# Patient Record
Sex: Female | Born: 1975
Health system: Southern US, Community
[De-identification: ages and names within clinical notes are randomized; demographics above are authoritative.]

## PROBLEM LIST (undated history)

## (undated) DIAGNOSIS — J45909 Unspecified asthma, uncomplicated: Secondary | ICD-10-CM

## (undated) DIAGNOSIS — F329 Major depressive disorder, single episode, unspecified: Secondary | ICD-10-CM

## (undated) DIAGNOSIS — D649 Anemia, unspecified: Secondary | ICD-10-CM

## (undated) DIAGNOSIS — F32A Depression, unspecified: Secondary | ICD-10-CM

## (undated) DIAGNOSIS — E079 Disorder of thyroid, unspecified: Secondary | ICD-10-CM

## (undated) HISTORY — DX: Unspecified asthma, uncomplicated: J45.909

## (undated) HISTORY — DX: Disorder of thyroid, unspecified: E07.9

## (undated) HISTORY — DX: Anemia, unspecified: D64.9

## (undated) HISTORY — DX: Depression, unspecified: F32.A

## (undated) HISTORY — DX: Major depressive disorder, single episode, unspecified: F32.9

---

## 1997-09-17 ENCOUNTER — Emergency Department (HOSPITAL_COMMUNITY): Admission: EM | Admit: 1997-09-17 | Discharge: 1997-09-17 | Payer: Self-pay | Admitting: Emergency Medicine

## 1999-11-07 ENCOUNTER — Inpatient Hospital Stay (HOSPITAL_COMMUNITY): Admission: AD | Admit: 1999-11-07 | Discharge: 1999-11-07 | Payer: Self-pay | Admitting: Obstetrics & Gynecology

## 1999-11-07 ENCOUNTER — Encounter: Payer: Self-pay | Admitting: *Deleted

## 1999-12-01 ENCOUNTER — Other Ambulatory Visit: Admission: RE | Admit: 1999-12-01 | Discharge: 1999-12-01 | Payer: Self-pay | Admitting: Obstetrics & Gynecology

## 2000-04-04 ENCOUNTER — Inpatient Hospital Stay (HOSPITAL_COMMUNITY): Admission: AD | Admit: 2000-04-04 | Discharge: 2000-04-04 | Payer: Self-pay | Admitting: Obstetrics and Gynecology

## 2000-06-19 ENCOUNTER — Inpatient Hospital Stay (HOSPITAL_COMMUNITY): Admission: AD | Admit: 2000-06-19 | Discharge: 2000-06-22 | Payer: Self-pay | Admitting: Obstetrics & Gynecology

## 2000-08-03 ENCOUNTER — Other Ambulatory Visit: Admission: RE | Admit: 2000-08-03 | Discharge: 2000-08-03 | Payer: Self-pay | Admitting: Obstetrics & Gynecology

## 2003-02-10 HISTORY — PX: TUBAL LIGATION: SHX77

## 2003-09-02 ENCOUNTER — Inpatient Hospital Stay (HOSPITAL_COMMUNITY): Admission: AD | Admit: 2003-09-02 | Discharge: 2003-09-02 | Payer: Self-pay | Admitting: Obstetrics and Gynecology

## 2003-10-08 ENCOUNTER — Inpatient Hospital Stay (HOSPITAL_COMMUNITY): Admission: AD | Admit: 2003-10-08 | Discharge: 2003-10-08 | Payer: Self-pay | Admitting: Obstetrics and Gynecology

## 2003-11-22 ENCOUNTER — Encounter (INDEPENDENT_AMBULATORY_CARE_PROVIDER_SITE_OTHER): Payer: Self-pay | Admitting: Specialist

## 2003-11-22 ENCOUNTER — Inpatient Hospital Stay (HOSPITAL_COMMUNITY): Admission: AD | Admit: 2003-11-22 | Discharge: 2003-11-25 | Payer: Self-pay | Admitting: Obstetrics and Gynecology

## 2003-11-26 ENCOUNTER — Encounter: Admission: RE | Admit: 2003-11-26 | Discharge: 2003-12-26 | Payer: Self-pay | Admitting: Obstetrics and Gynecology

## 2003-12-27 ENCOUNTER — Encounter: Admission: RE | Admit: 2003-12-27 | Discharge: 2004-01-26 | Payer: Self-pay | Admitting: Obstetrics and Gynecology

## 2004-02-26 ENCOUNTER — Encounter: Admission: RE | Admit: 2004-02-26 | Discharge: 2004-03-27 | Payer: Self-pay | Admitting: Obstetrics and Gynecology

## 2004-03-21 ENCOUNTER — Emergency Department (HOSPITAL_COMMUNITY): Admission: EM | Admit: 2004-03-21 | Discharge: 2004-03-21 | Payer: Self-pay | Admitting: Emergency Medicine

## 2004-03-28 ENCOUNTER — Encounter: Admission: RE | Admit: 2004-03-28 | Discharge: 2004-04-27 | Payer: Self-pay | Admitting: Obstetrics and Gynecology

## 2004-05-26 ENCOUNTER — Encounter: Admission: RE | Admit: 2004-05-26 | Discharge: 2004-06-25 | Payer: Self-pay | Admitting: Obstetrics and Gynecology

## 2004-07-26 ENCOUNTER — Encounter: Admission: RE | Admit: 2004-07-26 | Discharge: 2004-08-25 | Payer: Self-pay | Admitting: Obstetrics and Gynecology

## 2004-09-25 ENCOUNTER — Encounter: Admission: RE | Admit: 2004-09-25 | Discharge: 2004-10-24 | Payer: Self-pay | Admitting: Obstetrics and Gynecology

## 2005-06-23 ENCOUNTER — Emergency Department (HOSPITAL_COMMUNITY): Admission: EM | Admit: 2005-06-23 | Discharge: 2005-06-23 | Payer: Self-pay | Admitting: Emergency Medicine

## 2005-06-24 ENCOUNTER — Ambulatory Visit (HOSPITAL_COMMUNITY): Admission: RE | Admit: 2005-06-24 | Discharge: 2005-06-24 | Payer: Self-pay | Admitting: Obstetrics and Gynecology

## 2006-09-28 ENCOUNTER — Other Ambulatory Visit: Admission: RE | Admit: 2006-09-28 | Discharge: 2006-09-28 | Payer: Self-pay | Admitting: Family Medicine

## 2006-10-05 ENCOUNTER — Encounter: Admission: RE | Admit: 2006-10-05 | Discharge: 2006-10-05 | Payer: Self-pay | Admitting: Family Medicine

## 2007-09-20 ENCOUNTER — Emergency Department (HOSPITAL_COMMUNITY): Admission: EM | Admit: 2007-09-20 | Discharge: 2007-09-20 | Payer: Self-pay | Admitting: Emergency Medicine

## 2007-10-09 ENCOUNTER — Emergency Department (HOSPITAL_COMMUNITY): Admission: EM | Admit: 2007-10-09 | Discharge: 2007-10-09 | Payer: Self-pay | Admitting: Emergency Medicine

## 2008-05-02 ENCOUNTER — Emergency Department (HOSPITAL_COMMUNITY): Admission: EM | Admit: 2008-05-02 | Discharge: 2008-05-02 | Payer: Self-pay | Admitting: Emergency Medicine

## 2008-11-01 ENCOUNTER — Other Ambulatory Visit: Admission: RE | Admit: 2008-11-01 | Discharge: 2008-11-01 | Payer: Self-pay | Admitting: Gynecology

## 2008-11-01 ENCOUNTER — Encounter: Payer: Self-pay | Admitting: Gynecology

## 2008-11-01 ENCOUNTER — Ambulatory Visit: Payer: Self-pay | Admitting: Gynecology

## 2008-11-07 ENCOUNTER — Ambulatory Visit: Payer: Self-pay | Admitting: Gynecology

## 2010-06-27 NOTE — Discharge Summary (Signed)
NAMESHAKENDRA, Carla Wheeler             ACCOUNT NO.:  [id number]   MEDICAL RECORD NO.:  [id number]          PATIENT TYPE:  INP   LOCATION:  9101                          FACILITY:  [facility]   PHYSICIAN:  Zenaida Niece, M.D.DATE OF BIRTH:  Sep 04, 1975   DATE OF ADMISSION:  11/22/2003  DATE OF DISCHARGE:  11/25/2003                                 DISCHARGE SUMMARY   ADMISSION DIAGNOSES:  1.  Intrauterine pregnancy at 35 weeks.  2.  Placenta previa.  3.  Preterm premature rupture of membranes.   DISCHARGE DIAGNOSES:  1.  Intrauterine pregnancy at 35 weeks.  2.  Placenta previa.  3.  Preterm premature rupture of membranes.   PROCEDURES:  On November 22, 2003 she had a low vertical cesarean section and  bilateral partial salpingectomy.   HISTORY OF PRESENT ILLNESS:  This is a 35 year old black female gravida 3  para 2-0-0-2 with an EGA of 35+ weeks by a 10-week ultrasound who presents  with the complaint of vaginal bleeding and clots and possibly a gush of  fluid.  She does have pelvic pressure, possible contractions, and good fetal  movement.  Evaluation in triage revealed bloody fluid but no active  bleeding.  I was unable to confirm rupture of membranes.  However, an  ultrasound was performed which revealed that she did still have a complete  placenta previa as well as a low AFI of 4.6 which would be consistent with  ruptured membranes.  Prenatal care has been complicated by a placenta previa  with a recent ultrasound on October 5 showing the placenta covering the  cervical os, and she was scheduled for a C-section on October 28.  Prenatal  care was otherwise uncomplicated.  Please see full history and physical on  the chart for prenatal labs.  Past history significant for two vaginal  deliveries without complications.  Physical exam significant for a benign  abdomen that was gravid, consistent with a 36-week pregnancy.  Fetal heart  tracing was normal.  Cervix was visually closed with  bloody discharge.   HOSPITAL COURSE:  The patient was admitted on October 13 and on that evening  Dr. Ambrose Mantle performed a low vertical cesarean section and tubal ligation.  This was done under spinal anesthesia with an estimated blood loss of 1000  mL.  She delivered a viable female infant with Apgars of 9 and 9 that weighed  6 pounds 8 ounces.  Postoperatively, she had no significant complications.  Predelivery hemoglobin was 10.7, postdelivery was 8.7.  On postoperative day  #3 she requested discharge and was felt to be stable enough for discharge  home.  Her incision was healing well and prior to discharge her staples were  to be removed and Steri-Strips applied.   DISCHARGE INSTRUCTIONS:  Regular diet, pelvic rest, no strenuous activity.  Follow-up is in 10-14 days for an incision check.  Medications are Percocet  #40 one to two p.o. q.4-6h. p.r.n. pain and over-the-counter ibuprofen as  needed.  She is given our discharge pamphlet.    Todd  TDM/MEDQ  D:  11/25/2003  T:  11/26/2003  Job:  [id number]
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
## 2010-06-27 NOTE — Op Note (Signed)
Carla Wheeler, Carla NO.:  [id number]   MEDICAL RECORD NO.:  [id number]          PATIENT TYPE:  INP   LOCATION:  9101                          FACILITY:  [facility]   PHYSICIAN:  Malachi Pro. Ambrose Mantle, M.D. DATE OF BIRTH:  1975-02-14   DATE OF PROCEDURE:  11/22/2003  DATE OF DISCHARGE:                                 OPERATIVE REPORT   PREOPERATIVE DIAGNOSES:  1.  Intrauterine pregnancy at 35 weeks and 6 days.  2.  Placenta previa.  3.  Probable rupture of membranes.  4.  Sterilization.   POSTOPERATIVE DIAGNOSES:  1.  Intrauterine pregnancy at 35 weeks and 6 days.  2.  Placenta previa.  3.  Probable rupture of membranes.  4.  Sterilization.   OPERATION:  Low vertical cesarean section and bilateral tubal ligation.   SURGEON:  Malachi Pro. Ambrose Mantle, M.D.   ANESTHESIA:  Spinal.   DESCRIPTION OF PROCEDURE:  The patient was brought to the operating room and  given a spinal anesthetic by Dr. Jean Rosenthal. She was placed in the left lateral  tilt position. The fetal heart tones were confirmed to be normal in the  130's and the abdomen was prepped with Betadine solution and a Foley  catheter was inserted to straight drain after prepping the urethra. The  patient was placed supine in the left lateral tilt position, the abdomen was  draped as a sterile field. After anesthesia was confirmed, a transverse  incision was made and carried in layers through the skin, subcutaneous  tissue and fascia. The fascia was then incised transversely, separated from  the rectus muscles superiorly and inferiorly. A midline was created and the  peritoneum was opened vertically. The lower uterine segment was exposed.  Even though the lower uterine segment was fairly well developed, there was  no presenting part near the pelvis so I chose to make a low vertical  incision and made the incision in the uterus and then extended the incision  superiorly and inferiorly with blunt dissection. The cord was  apparent right  there under the incision, the head was very high, I was able to grasp the  head and with fundal pressure bring the head through the incisional opening.  After the body was delivered, the cord was clamped, the infant was given to  Dr. Tillman Abide who was in attendance.  He assigned Apgar's of 9 & 9 at 1 and 5  minutes. The baby was female and weighed over 6 pounds. The placenta was  removed, I was not able to tell for sure that it was a placenta previa but  clinically it certainly acted like a placenta previa and she had had four or  five ultrasounds confirming placenta previa.  The inside of the uterus was  palpated and found to be free of any placental fragments.  The cervix was  dilated with a ring forceps and then the vertical incision was closed with a  running suture of #0 Vicryl locking the suture and then the second layer was  done initially vertically and then a transverse layer was done.  Hemostasis  was achieved by  additional figure-of-eight sutures of #0 and 3-0 Vicryl.  Both tubes and ovaries appeared normal. I identified both tubes, traced them  to their fimbriated ends, created a window in both mesosalpinx, placed two  ties of #0 plain catgut proximally and distally on the mid portion of each  tube and cut out a segment of tube intervening. There was no bleeding,  reinspection of the uterine incisions found no bleeding. The abdominal wall  was then closed after liberal irrigation of the pelvis with interrupted  sutures of #0 Vicryl on the peritoneum and rectus muscle.  Two running  sutures of #0 Vicryl on the fascia, a running 3-0 Vicryl on the subcu tissue  and staples on the skin. The patient seemed to tolerate the procedure well.  Blood loss was estimated at 1000 mL. Sponge and needle counts were correct.  The patient returned to recovery in satisfactory condition. Dr. Tillman Abide sent the  baby to the fullterm nursery.      TFH/MEDQ  D:  11/22/2003  T:  11/22/2003  Job:   [id number]

## 2011-07-15 DIAGNOSIS — F32A Depression, unspecified: Secondary | ICD-10-CM | POA: Insufficient documentation

## 2011-07-15 DIAGNOSIS — F419 Anxiety disorder, unspecified: Secondary | ICD-10-CM | POA: Insufficient documentation

## 2013-05-26 ENCOUNTER — Ambulatory Visit: Payer: Self-pay | Admitting: Podiatrist

## 2013-07-21 ENCOUNTER — Ambulatory Visit (INDEPENDENT_AMBULATORY_CARE_PROVIDER_SITE_OTHER): Payer: BC Managed Care – PPO | Admitting: Internal Medicine

## 2013-07-21 ENCOUNTER — Encounter: Payer: Self-pay | Admitting: Internal Medicine

## 2013-07-21 VITALS — BP 106/68 | HR 76 | Temp 98.4°F | Ht 60.0 in | Wt 197.0 lb

## 2013-07-21 DIAGNOSIS — S90822A Blister (nonthermal), left foot, initial encounter: Secondary | ICD-10-CM

## 2013-07-21 DIAGNOSIS — E039 Hypothyroidism, unspecified: Secondary | ICD-10-CM

## 2013-07-21 DIAGNOSIS — IMO0002 Reserved for concepts with insufficient information to code with codable children: Secondary | ICD-10-CM

## 2013-07-21 DIAGNOSIS — E669 Obesity, unspecified: Secondary | ICD-10-CM

## 2013-07-21 MED ORDER — LEVOTHYROXINE SODIUM 100 MCG PO TABS: 100.0000 ug | ORAL_TABLET | Freq: Every day | ORAL | Status: DC

## 2013-07-21 MED ORDER — PHENTERMINE HCL 37.5 MG PO CAPS: 37.5000 mg | ORAL_CAPSULE | ORAL | Status: DC

## 2013-07-21 NOTE — Assessment & Plan Note (Signed)
She has been out of her medicine x 3 weeks Will restart at current dose Plan to recheck TSH in 1 month

## 2013-07-21 NOTE — Progress Notes (Signed)
Pre visit review using our clinic review tool, if applicable. No additional management support is needed unless otherwise documented below in the visit note. 

## 2013-07-21 NOTE — Patient Instructions (Addendum)

## 2013-07-21 NOTE — Progress Notes (Signed)
HPI  Pt presents to the clinic today to establish care. She has not had a PCP in many years. She does report that she has been on phentermine in the past for weight loss. She has not had it in the last 6 months. She would like to restart it. She also reports blisters on the bottom of her left foot. She reports this started 2 months ago. The blister bust and then they burn and sting. She has never had anything like this in the past. She has been putting neosporin on it and tinactin without any relief.  Flu: never Tetanus: more than 10 years LMP: 07/10/2013 Pap smear: 2013 Mammogram: 2014 Dentist: as needed  Past Medical History  Diagnosis Date  . Thyroid disease   . Asthma   . Depression     No current outpatient prescriptions on file.   No current facility-administered medications for this visit.    Allergies  Allergen Reactions  . Augmentin [Amoxicillin-Pot Clavulanate] Other (See Comments)    Muscle cramps    Family History  Problem Relation Age of Onset  . Cancer Mother     Breast  . Arthritis Father   . Heart disease Father   . Stroke Father   . Hypertension Father   . Diabetes Father   . Congestive Heart Failure Father   . Cancer Maternal Grandmother     Breast  . Hypertension Maternal Grandmother   . Hypertension Paternal Grandmother     History   Social History  . Marital Status: Married    Spouse Name: N/A    Number of Children: N/A  . Years of Education: N/A   Occupational History  . Not on file.   Social History Main Topics  . Smoking status: Former Smoker    Types: Cigars  . Smokeless tobacco: Never Used  . Alcohol Use: Yes     Comment: occasional  . Drug Use: Not on file  . Sexual Activity: Not on file   Other Topics Concern  . Not on file   Social History Narrative  . No narrative on file    ROS:  Constitutional: Pt reports weight gain. Denies fever, malaise, fatigue, headache.  Respiratory: Denies difficulty breathing, shortness  of breath, cough or sputum production.   Cardiovascular: Denies chest pain, chest tightness, palpitations or swelling in the hands or feet.  Skin: Pt reports blisters on bottom of left foot. Denies redness, rashes, lesions or ulcercations.  Neurological: Denies dizziness, difficulty with memory, difficulty with speech or problems with balance and coordination.   No other specific complaints in a complete review of systems (except as listed in HPI above).  PE:  BP 106/68  Pulse 76  Temp(Src) 98.4 F (36.9 C) (Oral)  Ht 5' (1.524 m)  Wt 197 lb (89.359 kg)  BMI 38.47 kg/m2  SpO2 98%  LMP 07/10/2013 Wt Readings from Last 3 Encounters:  07/21/13 197 lb (89.359 kg)    General: Appears her stated age, obese but well developed, well nourished in NAD. Skin: Healing blisters noted on bottom of left foot, some cracking of the skin noted as well. Cardiovascular: Normal rate and rhythm. S1,S2 noted.  No murmur, rubs or gallops noted. No JVD or BLE edema. No carotid bruits noted. Pulmonary/Chest: Normal effort and positive vesicular breath sounds. No respiratory distress. No wheezes, rales or ronchi noted.    Assessment and Plan:  Blisters on sole of left foot:  Using tinactin without relief Will refer to podiatry

## 2013-07-21 NOTE — Assessment & Plan Note (Signed)
Counseled on diet and exercise Will restart phentermine today  RTC in 1 month for weight check/med refill along with your physical

## 2013-08-09 ENCOUNTER — Other Ambulatory Visit: Payer: Self-pay | Admitting: *Deleted

## 2013-08-09 ENCOUNTER — Encounter: Payer: Self-pay | Admitting: Podiatry

## 2013-08-09 ENCOUNTER — Ambulatory Visit (INDEPENDENT_AMBULATORY_CARE_PROVIDER_SITE_OTHER): Payer: BC Managed Care – PPO | Admitting: Podiatry

## 2013-08-09 VITALS — Ht 60.0 in | Wt 190.0 lb

## 2013-08-09 DIAGNOSIS — B353 Tinea pedis: Secondary | ICD-10-CM

## 2013-08-09 MED ORDER — TERBINAFINE HCL 250 MG PO TABS: 250.0000 mg | ORAL_TABLET | Freq: Every day | ORAL | Status: DC

## 2013-08-09 NOTE — Progress Notes (Signed)
   Subjective:    Patient ID: Carla Wheeler, female    DOB: [birth year], 38 y.o.   MRN: [id number]  HPI Comments: On the left foot i have been getting blisters on the bottom and when they bust they itch. And it has moved to the lateral side of the foot, been using different over the counter creams .     Review of Systems  HENT: Positive for sneezing.   Eyes: Positive for itching.  All other systems reviewed and are negative.      Objective:   Physical Exam: I have reviewed her past medical history medications allergies surgeries social history and review of systems. At this rash is been there for approximately 2 months only. Itches terribly in his painful.  Pulses are strongly palpable bilateral. Neurologic sensorium is intact per Semmes-Weinstein monofilament. Deep tendon reflexes are brisk and equal bilateral. Muscle strength is 5 over 5 dorsiflexors plantar flexors inverters everters all intrinsic musculature is intact. Orthopedic evaluation demonstrates all joints distal to the ankle a full range of motion without crepitation. Cutaneous evaluation demonstrates supple well hydrated cutis with exception of the plantar medial longitudinal arch of the left foot which does demonstrate tinea pedis and vesicular tinea pedis. It also demonstrates signs of this fibrotic eczema.        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  Plan: Currently we started her one month of Lamisil 250 mg 1 by mouth daily she will followup with me in one month.

## 2013-08-25 ENCOUNTER — Encounter: Payer: BC Managed Care – PPO | Admitting: Internal Medicine

## 2013-09-06 ENCOUNTER — Ambulatory Visit: Payer: BC Managed Care – PPO | Admitting: Podiatry

## 2013-09-07 ENCOUNTER — Ambulatory Visit (INDEPENDENT_AMBULATORY_CARE_PROVIDER_SITE_OTHER): Payer: BC Managed Care – PPO | Admitting: Internal Medicine

## 2013-09-07 ENCOUNTER — Other Ambulatory Visit: Payer: Self-pay | Admitting: Internal Medicine

## 2013-09-07 ENCOUNTER — Encounter: Payer: Self-pay | Admitting: Internal Medicine

## 2013-09-07 VITALS — BP 118/74 | HR 67 | Temp 98.2°F | Resp 16 | Ht 60.0 in | Wt 196.0 lb

## 2013-09-07 DIAGNOSIS — Z23 Encounter for immunization: Secondary | ICD-10-CM

## 2013-09-07 DIAGNOSIS — Z0000 Encounter for general adult medical examination without abnormal findings: Secondary | ICD-10-CM

## 2013-09-07 DIAGNOSIS — E039 Hypothyroidism, unspecified: Secondary | ICD-10-CM

## 2013-09-07 DIAGNOSIS — E669 Obesity, unspecified: Secondary | ICD-10-CM

## 2013-09-07 DIAGNOSIS — Z13220 Encounter for screening for lipoid disorders: Secondary | ICD-10-CM

## 2013-09-07 LAB — CBC
HCT: 34.1 % — ABNORMAL LOW (ref 36.0–46.0)
Hemoglobin: 11 g/dL — ABNORMAL LOW (ref 12.0–15.0)
MCHC: 32.3 g/dL (ref 30.0–36.0)
MCV: 77.5 fl — ABNORMAL LOW (ref 78.0–100.0)
Platelets: 258 10*3/uL (ref 150.0–400.0)
RBC: 4.39 Mil/uL (ref 3.87–5.11)
RDW: 17.8 % — AB (ref 11.5–15.5)
WBC: 4.3 10*3/uL (ref 4.0–10.5)

## 2013-09-07 LAB — COMPREHENSIVE METABOLIC PANEL
ALT: 15 U/L (ref 0–35)
AST: 17 U/L (ref 0–37)
Albumin: 3.6 g/dL (ref 3.5–5.2)
Alkaline Phosphatase: 45 U/L (ref 39–117)
BUN: 10 mg/dL (ref 6–23)
CALCIUM: 8.6 mg/dL (ref 8.4–10.5)
CHLORIDE: 105 meq/L (ref 96–112)
CO2: 26 meq/L (ref 19–32)
Creatinine, Ser: 0.7 mg/dL (ref 0.4–1.2)
GFR: 111.97 mL/min (ref >60.00)
Glucose, Bld: 96 mg/dL (ref 70–99)
POTASSIUM: 4.3 meq/L (ref 3.5–5.1)
SODIUM: 136 meq/L (ref 135–145)
TOTAL PROTEIN: 7.1 g/dL (ref 6.0–8.3)
Total Bilirubin: 0.2 mg/dL (ref 0.2–1.2)

## 2013-09-07 LAB — LIPID PANEL
CHOL/HDL RATIO: 4
Cholesterol: 164 mg/dL (ref 0–200)
HDL: 42.2 mg/dL (ref >39.00)
LDL CALC: 103 mg/dL — AB (ref 0–99)
NONHDL: 121.8
TRIGLYCERIDES: 96 mg/dL (ref 0.0–149.0)
VLDL: 19.2 mg/dL (ref 0.0–40.0)

## 2013-09-07 LAB — TSH: TSH: 2.64 u[IU]/mL (ref 0.35–4.50)

## 2013-09-07 LAB — HEMOGLOBIN A1C: Hgb A1c MFr Bld: 5.7 % (ref 4.6–6.5)

## 2013-09-07 LAB — T4, FREE: FREE T4: 0.9 ng/dL (ref 0.60–1.60)

## 2013-09-07 MED ORDER — PHENTERMINE HCL 37.5 MG PO CAPS: 37.5000 mg | ORAL_CAPSULE | ORAL | Status: DC

## 2013-09-07 NOTE — Assessment & Plan Note (Signed)
No weight loss on phentermine but she is only eating once daily and snacking before bed Discussed feeding your body small, frequent meals to increase metabolism Diet plan given Encouraged her to walk for at least 30 minutes 4 days a week Phentermine refilled  RTC in 1 month for weight check/med refill

## 2013-09-07 NOTE — Progress Notes (Signed)
Subjective:    Patient ID: Carla Wheeler, female    DOB: May 25, 1975, 38 y.o.   MRN: [id number]  HPI  Pt presents to the clinic today for her annual exam.  Flu: never Tetanus: > 10 years ago LMP: 08/16/13 Pap Smear: 2014- normal Dentist: biannually  Additionally, she was restarted on her synthroid 07/21/13 after being out of it for 3 weeks. She needs her TSH rechecked today.  She was also restarted on Phentermine at her last visit. She has been on this in the past. Her starting weight was 197 pounds. She has lost a total of 1 pounds. She reports she is not hungry during the day but more hungry before bed. Her diet consists of:  Breakfast: none Lunch: [employer] chicken salad Dinner: none Snacks: popcorn, oatmeal cookies, chips  Review of Systems      Past Medical History  Diagnosis Date  . Thyroid disease   . Asthma   . Depression     Current Outpatient Prescriptions  Medication Sig Dispense Refill  . ferrous sulfate 325 (65 FE) MG tablet Take 325 mg by mouth daily with breakfast.      . Fluticasone-Salmeterol (ADVAIR DISKUS) 250-50 MCG/DOSE AEPB Inhale 1 puff into the lungs 2 (two) times daily.      Marland Kitchen guaiFENesin (MUCINEX) 600 MG 12 hr tablet Take 1-2 tablets by mouth every 12 hours as needed for congestion no longer than 4 days      . levothyroxine (SYNTHROID, LEVOTHROID) 100 MCG tablet Take 1 tablet (100 mcg total) by mouth daily before breakfast.  30 tablet  0  . phentermine 37.5 MG capsule Take 1 capsule (37.5 mg total) by mouth every morning.  30 capsule  0  . terbinafine (LAMISIL) 250 MG tablet Take 1 tablet (250 mg total) by mouth daily.  30 tablet  0  . triamcinolone (NASACORT) 55 MCG/ACT AERO nasal inhaler Place into the nose.       No current facility-administered medications for this visit.    Allergies  Allergen Reactions  . Augmentin [Amoxicillin-Pot Clavulanate] Other (See Comments)    Muscle cramps    Family History  Problem Relation Age of Onset    . Cancer Mother     Breast  . Arthritis Father   . Heart disease Father   . Stroke Father   . Hypertension Father   . Diabetes Father   . Congestive Heart Failure Father   . Cancer Maternal Grandmother     Breast  . Hypertension Maternal Grandmother   . Hypertension Paternal Grandmother   . Learning disabilities Paternal Grandmother     History   Social History  . Marital Status: Married    Spouse Name: N/A    Number of Children: N/A  . Years of Education: N/A   Occupational History  . Not on file.   Social History Main Topics  . Smoking status: Former Smoker    Types: Cigars  . Smokeless tobacco: Never Used  . Alcohol Use: 1.8 oz/week    3 Glasses of wine per week     Comment: occasional  . Drug Use: No  . Sexual Activity: Yes   Other Topics Concern  . Not on file   Social History Narrative  . No narrative on file     Constitutional: Denies fever, malaise, fatigue, headache or abrupt weight changes.  HEENT: Denies eye pain, eye redness, ear pain, ringing in the ears, wax buildup, runny nose, nasal congestion, bloody nose, or sore  throat. Respiratory: Denies difficulty breathing, shortness of breath, cough or sputum production.   Cardiovascular: Denies chest pain, chest tightness, palpitations or swelling in the hands or feet.  Gastrointestinal: Denies abdominal pain, bloating, constipation, diarrhea or blood in the stool.  GU: Denies urgency, frequency, pain with urination, burning sensation, blood in urine, odor or discharge. Musculoskeletal: Pt reports low back pain. Denies decrease in range of motion, difficulty with gait, muscle pain or joint pain and swelling.  Skin: Denies redness, rashes, lesions or ulcercations.  Neurological: Denies dizziness, difficulty with memory, difficulty with speech or problems with balance and coordination.   No other specific complaints in a complete review of systems (except as listed in HPI above).  Objective:   Physical  Exam   Ht 5' (1.524 m)  Wt 196 lb (88.905 kg)  BMI 38.28 kg/m2  LMP 08/17/2013 Wt Readings from Last 3 Encounters:  09/07/13 196 lb (88.905 kg)  08/09/13 190 lb (86.183 kg)  07/21/13 197 lb (89.359 kg)    General: Appears her stated age, obese but well developed, well nourished in NAD. Skin: Warm, dry and intact. No rashes, lesions or ulcerations noted. HEENT: Head: normal shape and size; Eyes: sclera white, no icterus, conjunctiva pink, PERRLA and EOMs intact; Ears: Tm's gray and intact, normal light reflex; Nose: mucosa pink and moist, septum midline; Throat/Mouth: Teeth present, mucosa pink and moist, no exudate, lesions or ulcerations noted.  Neck: Normal range of motion. Neck supple, trachea midline. No massses, lumps or thyromegaly present.  Cardiovascular: Normal rate and rhythm. S1,S2 noted.  No murmur, rubs or gallops noted. No JVD or BLE edema. No carotid bruits noted. Pulmonary/Chest: Normal effort and positive vesicular breath sounds. No respiratory distress. No wheezes, rales or ronchi noted.  Abdomen: Soft and nontender. Normal bowel sounds, no bruits noted. No distention or masses noted. Liver, spleen and kidneys non palpable. Musculoskeletal: Normal flexion, extension and rotation of the lower spine. Strength 5/5 BLE. Pain with palpation of the lower spine. No difficulty with gait.  Neurological: Alert and oriented. Cranial nerves II-XII intact. Coordination normal. +DTRs bilaterally. Psychiatric: Mood and affect normal. Behavior is normal. Judgment and thought content normal.       Assessment & Plan:   Preventative Health Maintenance:  Tdap given today She does not take flu vaccines Screening labs today CBC, CMET, Lipid and A1C

## 2013-09-07 NOTE — Progress Notes (Signed)
Pre visit review using our clinic review tool, if applicable. No additional management support is needed unless otherwise documented below in the visit note. 

## 2013-09-07 NOTE — Addendum Note (Signed)
Addended by: Lurlean Nanny on: 09/07/2013 11:21 AM   Modules accepted: Orders

## 2013-09-07 NOTE — Patient Instructions (Addendum)

## 2013-09-07 NOTE — Assessment & Plan Note (Signed)
On synthroid Will repeat TSH and T4 today

## 2013-09-08 NOTE — Telephone Encounter (Signed)
Pt had labs done yesterday-please advise if okay to refill at this dose

## 2013-10-09 ENCOUNTER — Ambulatory Visit: Payer: BC Managed Care – PPO | Admitting: Internal Medicine

## 2013-10-09 DIAGNOSIS — Z0289 Encounter for other administrative examinations: Secondary | ICD-10-CM

## 2013-10-11 ENCOUNTER — Telehealth: Payer: Self-pay | Admitting: Internal Medicine

## 2013-10-11 NOTE — Telephone Encounter (Signed)
Ok to contact her. If she is going to continue the weight loss medication, she must come for her scheduled follow up appt

## 2013-10-11 NOTE — Telephone Encounter (Signed)
Patient did not come for their scheduled appointment 10/09/13  Please let me know if the patient needs to be contacted immediately for follow up or if no follow up is necessary.

## 2013-10-11 NOTE — Telephone Encounter (Signed)
Spoke with pt.  Pt stated she didn't have her calendar and she would call back to r/s

## 2013-11-24 ENCOUNTER — Other Ambulatory Visit: Payer: Self-pay

## 2014-03-09 ENCOUNTER — Ambulatory Visit (INDEPENDENT_AMBULATORY_CARE_PROVIDER_SITE_OTHER): Payer: BC Managed Care – PPO | Admitting: Nurse Practitioner

## 2014-03-09 ENCOUNTER — Encounter: Payer: Self-pay | Admitting: Nurse Practitioner

## 2014-03-09 VITALS — BP 120/78 | HR 68 | Temp 98.3°F | Resp 14 | Ht 61.0 in | Wt 195.4 lb

## 2014-03-09 DIAGNOSIS — H109 Unspecified conjunctivitis: Secondary | ICD-10-CM | POA: Insufficient documentation

## 2014-03-09 NOTE — Progress Notes (Signed)
   Subjective:    Patient ID: Carla Wheeler, female    DOB: July 16, 1975, 39 y.o.   MRN: [id number]  HPI Carla Wheeler is a 39 yo female with a CC of left eye itching x 1 day.  1)  Began this morning. Grainy, itchy, and watery, small amount of sleep on bottom lashes this morning. 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    Review of Systems  Constitutional: Negative for fever, chills, diaphoresis and fatigue.  HENT: Negative for congestion and rhinorrhea.   Eyes: Positive for discharge and itching. Negative for photophobia, pain, redness and visual disturbance.       Eye has watery discharge and itching  Respiratory: Negative for cough, chest tightness, shortness of breath and wheezing.   Cardiovascular: Negative for chest pain and leg swelling.  Gastrointestinal: Negative for nausea, vomiting and diarrhea.  Skin: Negative for rash.       Objective:   Physical Exam  Constitutional: She is oriented to person, place, and time. She appears well-developed and well-nourished. No distress.  BP 120/78 mmHg  Pulse 68  Temp(Src) 98.3 F (36.8 C) (Oral)  Resp 14  Ht 5\' 1"  (1.549 m)  Wt 195 lb 6.4 oz (88.633 kg)  BMI 36.94 kg/m2  SpO2 98%   HENT:  Head: Normocephalic and atraumatic.  Right Ear: External ear normal.  Left Ear: External ear normal.  Eyes: Pupils are equal, round, and reactive to light. Right eye exhibits no chemosis, no discharge, no exudate and no hordeolum. No foreign body present in the right eye. Left eye exhibits discharge. Left eye exhibits no chemosis, no exudate and no hordeolum. No foreign body present in the left eye. Right conjunctiva is not injected. Right conjunctiva has no hemorrhage. Left conjunctiva is injected. Left conjunctiva has no hemorrhage. No scleral icterus. Right eye exhibits normal extraocular motion and no nystagmus. Left eye exhibits normal extraocular motion and no nystagmus. Right pupil is  round and reactive. Left pupil is round and reactive. Pupils are equal.  Neurological: She is alert and oriented to person, place, and time. No cranial nerve deficit. She exhibits normal muscle tone. Coordination normal.  Skin: Skin is warm and dry. No rash noted. She is not diaphoretic.  Psychiatric: She has a normal mood and affect. Her behavior is normal. Judgment and thought content normal.      Assessment & Plan:

## 2014-03-09 NOTE — Patient Instructions (Addendum)
Visine-A Anti-histamine eye drops.  Viral Conjunctivitis Conjunctivitis is an irritation (inflammation) of the clear membrane that covers the white part of the eye (the conjunctiva). The irritation can also happen on the underside of the eyelids. Conjunctivitis makes the eye red or pink in color. This is what is commonly known as pink eye. Viral conjunctivitis can spread easily (contagious). CAUSES   Infection from virus on the surface of the eye.  Infection from the irritation or injury of nearby tissues such as the eyelids or cornea.  More serious inflammation or infection on the inside of the eye.  Other eye diseases.  The use of certain eye medications. SYMPTOMS  The normally white color of the eye or the underside of the eyelid is usually pink or red in color. The pink eye is usually associated with irritation, tearing and some sensitivity to light. Viral conjunctivitis is often associated with a clear, watery discharge. If a discharge is present, there may also be some blurred vision in the affected eye. DIAGNOSIS  Conjunctivitis is diagnosed by an eye exam. The eye specialist looks for changes in the surface tissues of the eye which take on changes characteristic of the specific types of conjunctivitis. A sample of any discharge may be collected on a Q-Tip (sterile swap). The sample will be sent to a lab to see whether or not the inflammation is caused by bacterial or viral infection. TREATMENT  Viral conjunctivitis will not respond to medicines that kill germs (antibiotics). Treatment is aimed at stopping a bacterial infection on top of the viral infection. The goal of treatment is to relieve symptoms (such as itching) with antihistamine drops or other eye medications.  HOME CARE INSTRUCTIONS   To ease discomfort, apply a cool, clean wash cloth to your eye for 10 to 20 minutes, 3 to 4 times a day.  Gently wipe away any drainage from the eye with a warm, wet washcloth or a cotton  ball.  Wash your hands often with soap and use paper towels to dry.  Do not share towels or washcloths. This may spread the infection.  Change or wash your pillowcase every day.  You should not use eye make-up until the infection is gone.  Stop using contacts lenses. Ask your eye professional how to sterilize or replace them before using again. This depends on the type of contact lenses used.  Do not touch the edge of the eyelid with the eye drop bottle or ointment tube when applying medications to the affected eye. This will stop you from spreading the infection to the other eye or to others. SEEK IMMEDIATE MEDICAL CARE IF:   The infection has not improved within 3 days of beginning treatment.  A watery discharge from the eye develops.  Pain in the eye increases.  The redness is spreading.  Vision becomes blurred.  An oral temperature above 102 F (38.9 C) develops, or as your caregiver suggests.  Facial pain, redness or swelling develops.  Any problems that may be related to the prescribed medicine develop. MAKE SURE YOU:   Understand these instructions.  Will watch your condition.  Will get help right away if you are not doing well or get worse. Document Released: 01/26/2005 Document Revised: 04/20/2011 Document Reviewed: 09/15/2007 [facility] Patient Information 2015 [place], [place]. This information is not intended to replace advice given to you by your health care provider. Make sure you discuss any questions you have with your health care provider.

## 2014-03-09 NOTE — Assessment & Plan Note (Addendum)
Worsening. Believed to be viral due to contagious nature and symptoms. Eye was not reddened and the conjunctiva of the left eye was injected. Anti-histamine eye drops OTC recommended for symptom relief. Asked pt to call back if matting, yellow drainage, or pain occurs. FU prn.   Gave pt handout of information on viral conjunctivitis and a note for work.

## 2014-03-09 NOTE — Progress Notes (Signed)
Pre visit review using our clinic review tool, if applicable. No additional management support is needed unless otherwise documented below in the visit note. 

## 2014-03-12 ENCOUNTER — Telehealth: Payer: Self-pay | Admitting: Internal Medicine

## 2014-03-12 NOTE — Telephone Encounter (Signed)
PLEASE NOTE: All timestamps contained within this report are represented as [place] Standard Time. CONFIDENTIALTY NOTICE: This fax transmission is intended only for the addressee. It contains information that is legally privileged, confidential or otherwise protected from use or disclosure. If you are not the intended recipient, you are strictly prohibited from reviewing, disclosing, copying using or disseminating any of this information or taking any action in reliance on or regarding this information. If you have received this fax in error, please notify us immediately by telephone so that we can arrange for its return to [place]. 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 Caller is wanting some more info on scabies, like how she can prevent them. Nurse Assessment Nurse: Arnoldo Morale, RN, Shelton Silvas Date/Time (Eastern Time): 03/12/2014 6:07:14 PM Confirm and document reason for call. If symptomatic, describe symptoms. 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 Caller is wanting some more info on scabies, like how she can prevent them. Has the patient traveled out of the country within the last 30 days? ---Not Applicable Does the patient require triage? ---No Please document clinical information provided and list any resource used. ---Scabies is prevented by avoiding direct skin-to-skin contact with an infested person or with items such as clothing or bedding used by an infested person. Scabies treatment usually is recommended for members of the same household, particularly for those who have had prolonged  skin-to-skin contact. All household members and other potentially exposed persons should be treated at the same time as the infested person to prevent possible reereexposure and reinfestation. per cdc

## 2014-04-13 ENCOUNTER — Other Ambulatory Visit: Payer: Self-pay

## 2014-04-13 MED ORDER — LEVOTHYROXINE SODIUM 100 MCG PO TABS: ORAL_TABLET | ORAL | Status: DC

## 2014-04-13 NOTE — Telephone Encounter (Signed)
Pt request refill levothyroxine 100 mcg to [facility];last CPX & TSH 09/08/13. Pt missed f/u appt but pt will call and schedule appt before med runs out.

## 2014-04-27 ENCOUNTER — Encounter: Payer: Self-pay | Admitting: Family Medicine

## 2014-04-27 ENCOUNTER — Ambulatory Visit (INDEPENDENT_AMBULATORY_CARE_PROVIDER_SITE_OTHER): Payer: BC Managed Care – PPO | Admitting: Family Medicine

## 2014-04-27 ENCOUNTER — Encounter: Payer: Self-pay | Admitting: *Deleted

## 2014-04-27 VITALS — BP 122/78 | HR 93 | Temp 100.7°F | Wt 192.5 lb

## 2014-04-27 DIAGNOSIS — R509 Fever, unspecified: Secondary | ICD-10-CM

## 2014-04-27 DIAGNOSIS — J209 Acute bronchitis, unspecified: Secondary | ICD-10-CM

## 2014-04-27 LAB — POCT INFLUENZA A/B
INFLUENZA B, POC: NEGATIVE
Influenza A, POC: NEGATIVE

## 2014-04-27 MED ORDER — AZITHROMYCIN 250 MG PO TABS: ORAL_TABLET | ORAL | Status: DC

## 2014-04-27 MED ORDER — ALBUTEROL SULFATE HFA 108 (90 BASE) MCG/ACT IN AERS: 2.0000 | INHALATION_SPRAY | Freq: Four times a day (QID) | RESPIRATORY_TRACT | Status: DC | PRN

## 2014-04-27 MED ORDER — GUAIFENESIN-CODEINE 100-10 MG/5ML PO SYRP: 5.0000 mL | ORAL_SOLUTION | Freq: Every evening | ORAL | Status: DC | PRN

## 2014-04-27 NOTE — Progress Notes (Signed)
   Subjective:    Patient ID: Carla Wheeler, female    DOB: 03/29/1975, 39 y.o.   MRN: [id number]  Cough This is a new problem. The current episode started in the past 7 days (5 days). The problem has been gradually worsening. The problem occurs constantly. The cough is productive of sputum. Associated symptoms include chest pain, chills, a fever, headaches, myalgias, nasal congestion, rhinorrhea, shortness of breath and wheezing. Pertinent negatives include no sore throat. Associated symptoms comments: 100.5-101 F in last 2 days  sensation of something in throat  Chest pain with deep breath, anterior. The symptoms are aggravated by lying down (kee(ping her up at night). Risk factors: nonsmoker. Treatments tried: mucinex robitussin DM, Alkaseltzer, advair. The treatment provided mild relief. Her past medical history is significant for asthma and environmental allergies.   Teacher for preK.  Strep exposure.   Review of Systems  Constitutional: Positive for fever and chills.  HENT: Positive for rhinorrhea. Negative for sore throat.   Respiratory: Positive for cough, shortness of breath and wheezing.   Cardiovascular: Positive for chest pain.  Musculoskeletal: Positive for myalgias.  Allergic/Immunologic: Positive for environmental allergies.  Neurological: Positive for headaches.       Objective:   Physical Exam  Constitutional: Vital signs are normal. She appears well-developed and well-nourished. She is cooperative.  Non-toxic appearance. She does not appear ill. No distress.  HENT:  Head: Normocephalic.  Right Ear: Hearing, tympanic membrane, external ear and ear canal normal. Tympanic membrane is not erythematous, not retracted and not bulging.  Left Ear: Hearing, tympanic membrane, external ear and ear canal normal. Tympanic membrane is not erythematous, not retracted and not bulging.  Nose: Mucosal edema and rhinorrhea present. Right sinus exhibits no maxillary sinus tenderness  and no frontal sinus tenderness. Left sinus exhibits no maxillary sinus tenderness and no frontal sinus tenderness.  Mouth/Throat: Uvula is midline, oropharynx is clear and moist and mucous membranes are normal. No oropharyngeal exudate, posterior oropharyngeal edema, posterior oropharyngeal erythema or tonsillar abscesses.  Eyes: Conjunctivae, EOM and lids are normal. Pupils are equal, round, and reactive to light. Lids are everted and swept, no foreign bodies found.  Neck: Trachea normal.  Cardiovascular: Normal rate, regular rhythm, S1 normal, S2 normal, normal heart sounds, intact distal pulses and normal pulses.  Exam reveals no gallop and no friction rub.   No murmur heard. Pulmonary/Chest: Effort normal and breath sounds normal. No tachypnea. No respiratory distress. She has no decreased breath sounds. She has no wheezes. She has no rhonchi. She has no rales.  Lymphadenopathy:    She has no cervical adenopathy.  Neurological: She is alert.  Skin: Skin is warm, dry and intact. No rash noted.  Psychiatric: Her speech is normal and behavior is normal. Judgment normal. Her mood appears not anxious. Cognition and memory are normal. She does not exhibit a depressed mood.          Assessment & Plan:

## 2014-04-27 NOTE — Patient Instructions (Signed)
Rest, fluids, complete antibiotics.  Mucinex DM during the day, cough suppressant prescription at night.

## 2014-04-27 NOTE — Progress Notes (Signed)
Pre visit review using our clinic review tool, if applicable. No additional management support is needed unless otherwise documented below in the visit note. 

## 2014-04-27 NOTE — Assessment & Plan Note (Addendum)
Treat with antibiotics given  New onset fever and prolonged course.  rest, fluids, continue advair given hx of asthma, will given rx for albuterol for wheeze to have for rescue.  Flu test negative.

## 2014-06-22 ENCOUNTER — Ambulatory Visit: Payer: BC Managed Care – PPO | Admitting: Internal Medicine

## 2014-06-22 ENCOUNTER — Other Ambulatory Visit: Payer: Self-pay | Admitting: Internal Medicine

## 2014-06-22 ENCOUNTER — Telehealth: Payer: Self-pay | Admitting: Internal Medicine

## 2014-06-22 DIAGNOSIS — Z0289 Encounter for other administrative examinations: Secondary | ICD-10-CM

## 2014-06-22 MED ORDER — LEVOTHYROXINE SODIUM 100 MCG PO TABS: ORAL_TABLET | ORAL | Status: DC

## 2014-06-22 NOTE — Telephone Encounter (Signed)
She already has follow up scheduled for next week

## 2014-06-22 NOTE — Telephone Encounter (Signed)
Patient did not come in for their appointment today for weigh check follow up.  Please let me know if patient needs to be contacted immediately for follow up or no follow up needed.

## 2014-06-26 ENCOUNTER — Encounter: Payer: Self-pay | Admitting: Internal Medicine

## 2014-06-26 ENCOUNTER — Ambulatory Visit (INDEPENDENT_AMBULATORY_CARE_PROVIDER_SITE_OTHER): Payer: BC Managed Care – PPO | Admitting: Internal Medicine

## 2014-06-26 VITALS — BP 128/82 | HR 85 | Temp 98.4°F | Wt 194.0 lb

## 2014-06-26 DIAGNOSIS — D509 Iron deficiency anemia, unspecified: Secondary | ICD-10-CM | POA: Diagnosis not present

## 2014-06-26 DIAGNOSIS — E039 Hypothyroidism, unspecified: Secondary | ICD-10-CM

## 2014-06-26 DIAGNOSIS — E669 Obesity, unspecified: Secondary | ICD-10-CM | POA: Diagnosis not present

## 2014-06-26 MED ORDER — PHENTERMINE HCL 15 MG PO CAPS: 15.0000 mg | ORAL_CAPSULE | ORAL | Status: DC

## 2014-06-26 NOTE — Assessment & Plan Note (Signed)
Will check TSH and T4 today Will adjust and refill Synthroid after labs are back

## 2014-06-26 NOTE — Patient Instructions (Signed)
Anemia, Nonspecific Anemia is a condition in which the concentration of red blood cells or hemoglobin in the blood is below normal. Hemoglobin is a substance in red blood cells that carries oxygen to the tissues of the body. Anemia results in not enough oxygen reaching these tissues.  CAUSES  Common causes of anemia include:   Excessive bleeding. Bleeding may be internal or external. This includes excessive bleeding from periods (in women) or from the intestine.   Poor nutrition.   Chronic kidney, thyroid, and liver disease.  Bone marrow disorders that decrease red blood cell production.  Cancer and treatments for cancer.  HIV, AIDS, and their treatments.  Spleen problems that increase red blood cell destruction.  Blood disorders.  Excess destruction of red blood cells due to infection, medicines, and autoimmune disorders. SIGNS AND SYMPTOMS   Minor weakness.   Dizziness.   Headache.  Palpitations.   Shortness of breath, especially with exercise.   Paleness.  Cold sensitivity.  Indigestion.  Nausea.  Difficulty sleeping.  Difficulty concentrating. Symptoms may occur suddenly or they may develop slowly.  DIAGNOSIS  Additional blood tests are often needed. These help your health care provider determine the best treatment. Your health care provider will check your stool for blood and look for other causes of blood loss.  TREATMENT  Treatment varies depending on the cause of the anemia. Treatment can include:   Supplements of iron, vitamin B12, or folic acid.   Hormone medicines.   A blood transfusion. This may be needed if blood loss is severe.   Hospitalization. This may be needed if there is significant continual blood loss.   Dietary changes.  Spleen removal. HOME CARE INSTRUCTIONS Keep all follow-up appointments. It often takes many weeks to correct anemia, and having your health care provider check on your condition and your response to  treatment is very important. SEEK IMMEDIATE MEDICAL CARE IF:   You develop extreme weakness, shortness of breath, or chest pain.   You become dizzy or have trouble concentrating.  You develop heavy vaginal bleeding.   You develop a rash.   You have bloody or black, tarry stools.   You faint.   You vomit up blood.   You vomit repeatedly.   You have abdominal pain.  You have a fever or persistent symptoms for more than 2-3 days.   You have a fever and your symptoms suddenly get worse.   You are dehydrated.  MAKE SURE YOU:  Understand these instructions.  Will watch your condition.  Will get help right away if you are not doing well or get worse. Document Released: 03/05/2004 Document Revised: 09/28/2012 Document Reviewed: 07/22/2012 [employer] Patient Information 2015 [employer]. This information is not intended to replace advice given to you by your health care provider. Make sure you discuss any questions you have with your health care provider.  

## 2014-06-26 NOTE — Progress Notes (Signed)
Subjective:    Patient ID: Carla Wheeler, female    DOB: 11/12/1975, 39 y.o.   MRN: [id number]  HPI  Pt presents to the clinic today with multiple complaints.  1- She needs to follow up hypothyroidism. She has been out of her Synthroid x 2 weeks, but restarted in 1 week ago. She denies weight gain, cold intolerance, constipation or dry skin.  2- She needs to follow up anemia. Her last H=H was 11/34.1. She has been taking the ferrous sulfate daily. She is not having any constipation or bleeding.   3- She also wants to get restarted on the Phentermine. She was on this for 1-2 months and was not very successful with weight loss. Her weight today is 194 lbs. BMI is 36.66. Her goal weight is 150 lbs. She is not currently exercising. She plans to join the [facility] Breakfast: She usually does not eat breakfast, but does have a cupcoffee. Lunch: Jordan or chicken sandwich. Dinner: Maceo Pro chicken, mashed potatos, green beans. Snacks: Belvita crackers.   Review of Systems      Past Medical History  Diagnosis Date  . Thyroid disease   . Asthma   . Depression     Current Outpatient Prescriptions  Medication Sig Dispense Refill  . albuterol (PROVENTIL HFA;VENTOLIN HFA) 108 (90 BASE) MCG/ACT inhaler Inhale 2 puffs into the lungs every 6 (six) hours as needed for wheezing or shortness of breath. 1 Inhaler 2  . ferrous sulfate 325 (65 FE) MG tablet Take 325 mg by mouth daily with breakfast.    . Fluticasone-Salmeterol (ADVAIR DISKUS) 250-50 MCG/DOSE AEPB Inhale 1 puff into the lungs 2 (two) times daily.    Marland Kitchen guaiFENesin (MUCINEX) 600 MG 12 hr tablet Take 1-2 tablets by mouth every 12 hours as needed for congestion no longer than 4 days    . levothyroxine (SYNTHROID, LEVOTHROID) 100 MCG tablet TAKE 1 TABLET (100 MCG TOTAL) BY MOUTH DAILY BEFORE BREAKFAST. 30 tablet 0  . phentermine 37.5 MG capsule Take 1 capsule (37.5 mg total) by mouth every morning. (Patient not taking: Reported on 06/26/2014) 30  capsule 0  . triamcinolone (NASACORT) 55 MCG/ACT AERO nasal inhaler Place into the nose.     No current facility-administered medications for this visit.    Allergies  Allergen Reactions  . Augmentin [Amoxicillin-Pot Clavulanate] Other (See Comments)    Muscle cramps    Family History  Problem Relation Age of Onset  . Cancer Mother     Breast  . Arthritis Father   . Heart disease Father   . Stroke Father   . Hypertension Father   . Diabetes Father   . Congestive Heart Failure Father   . Cancer Maternal Grandmother     Breast  . Hypertension Maternal Grandmother   . Hypertension Paternal Grandmother   . Learning disabilities Paternal Grandmother     History   Social History  . Marital Status: Married    Spouse Name: N/A  . Number of Children: N/A  . Years of Education: N/A   Occupational History  . Not on file.   Social History Main Topics  . Smoking status: Former Smoker    Types: Cigars  . Smokeless tobacco: Never Used  . Alcohol Use: 1.8 oz/week    3 Glasses of wine per week     Comment: occasional  . Drug Use: No  . Sexual Activity: Yes   Other Topics Concern  . Not on file   Social History Narrative  Constitutional: Denies fever, malaise, fatigue, headache or abrupt weight changes.  Respiratory: Denies difficulty breathing, shortness of breath, cough or sputum production.   Cardiovascular: Denies chest pain, chest tightness, palpitations or swelling in the hands or feet.  Gastrointestinal: Denies abdominal pain, bloating, constipation, diarrhea or blood in the stool.  Neurological: Denies dizziness, difficulty with memory, difficulty with speech or problems with balance and coordination.   No other specific complaints in a complete review of systems (except as listed in HPI above).  Objective:   Physical Exam   BP 128/82 mmHg  Pulse 85  Temp(Src) 98.4 F (36.9 C) (Oral)  Wt 194 lb (87.998 kg)  SpO2 98% Wt Readings from Last 3  Encounters:  06/26/14 194 lb (87.998 kg)  04/27/14 192 lb 8 oz (87.317 kg)  03/09/14 195 lb 6.4 oz (88.633 kg)    General: Appears her stated age, obese  in NAD. Neck: Neck supple, trachea midline. No masses, lumps or thyromegaly present.  Cardiovascular: Normal rate and rhythm. S1,S2 noted.  No murmur, rubs or gallops noted. No JVD or BLE edema. No carotid bruits noted. Pulmonary/Chest: Normal effort and positive vesicular breath sounds. No respiratory distress. No wheezes, rales or ronchi noted.  Abdomen: Soft and nontender. Normal bowel sounds, no bruits noted. No distention or masses noted. Liver, spleen and kidneys non palpable. Neurological: Alert and oriented.   BMET    Component Value Date/Time   NA 136 09/07/2013 0909   K 4.3 09/07/2013 0909   CL 105 09/07/2013 0909   CO2 26 09/07/2013 0909   GLUCOSE 96 09/07/2013 0909   BUN 10 09/07/2013 0909   CREATININE 0.7 09/07/2013 0909   CALCIUM 8.6 09/07/2013 0909    Lipid Panel     Component Value Date/Time   CHOL 164 09/07/2013 0909   TRIG 96.0 09/07/2013 0909   HDL 42.20 09/07/2013 0909   CHOLHDL 4 09/07/2013 0909   VLDL 19.2 09/07/2013 0909   LDLCALC 103* 09/07/2013 0909    CBC    Component Value Date/Time   WBC 4.3 09/07/2013 0909   RBC 4.39 09/07/2013 0909   HGB 11.0* 09/07/2013 0909   HCT 34.1* 09/07/2013 0909   PLT 258.0 09/07/2013 0909   MCV 77.5* 09/07/2013 0909   MCHC 32.3 09/07/2013 0909   RDW 17.8* 09/07/2013 0909    Hgb A1C Lab Results  Component Value Date   HGBA1C 5.7 09/07/2013        Assessment & Plan:

## 2014-06-26 NOTE — Assessment & Plan Note (Signed)
Will restart Phentermine today Discussed the importance of a healthy diet and exercise regimen  RTC in 1 month for weight/check/med refill

## 2014-06-26 NOTE — Assessment & Plan Note (Signed)
Will check CBC and iron panel Continue iron supplement at this time

## 2014-06-26 NOTE — Progress Notes (Signed)
Pre visit review using our clinic review tool, if applicable. No additional management support is needed unless otherwise documented below in the visit note. 

## 2014-06-27 ENCOUNTER — Other Ambulatory Visit: Payer: Self-pay | Admitting: Internal Medicine

## 2014-06-27 ENCOUNTER — Telehealth: Payer: Self-pay

## 2014-06-27 LAB — CBC
HEMATOCRIT: 32.9 % — AB (ref 36.0–46.0)
HEMOGLOBIN: 10.7 g/dL — AB (ref 12.0–15.0)
MCHC: 32.5 g/dL (ref 30.0–36.0)
MCV: 73.8 fl — ABNORMAL LOW (ref 78.0–100.0)
Platelets: 322 10*3/uL (ref 150.0–400.0)
RBC: 4.46 Mil/uL (ref 3.87–5.11)
RDW: 17.8 % — AB (ref 11.5–15.5)
WBC: 5.6 10*3/uL (ref 4.0–10.5)

## 2014-06-27 LAB — IBC PANEL
IRON: 29 ug/dL — AB (ref 42–145)
SATURATION RATIOS: 6.8 % — AB (ref 20.0–50.0)
TRANSFERRIN: 304 mg/dL (ref 212.0–360.0)

## 2014-06-27 LAB — T4, FREE: FREE T4: 0.8 ng/dL (ref 0.60–1.60)

## 2014-06-27 LAB — TSH: TSH: 4.67 u[IU]/mL — AB (ref 0.35–4.50)

## 2014-06-27 MED ORDER — LEVOTHYROXINE SODIUM 100 MCG PO TABS: ORAL_TABLET | ORAL | Status: DC

## 2014-06-27 NOTE — Telephone Encounter (Signed)
Pt left v/m requesting cb about lab results; pt notified as instructed from mychart note and pt voiced understanding.

## 2014-08-10 ENCOUNTER — Encounter: Payer: Self-pay | Admitting: Internal Medicine

## 2014-08-10 ENCOUNTER — Ambulatory Visit (INDEPENDENT_AMBULATORY_CARE_PROVIDER_SITE_OTHER)
Admission: RE | Admit: 2014-08-10 | Discharge: 2014-08-10 | Disposition: A | Discharge status: Home / Self Care | Payer: BC Managed Care – PPO | Source: Ambulatory Visit | Attending: Internal Medicine | Admitting: Internal Medicine

## 2014-08-10 ENCOUNTER — Ambulatory Visit (INDEPENDENT_AMBULATORY_CARE_PROVIDER_SITE_OTHER): Payer: BC Managed Care – PPO | Admitting: Internal Medicine

## 2014-08-10 VITALS — BP 118/84 | HR 75 | Temp 99.0°F | Wt 192.0 lb

## 2014-08-10 DIAGNOSIS — M545 Low back pain, unspecified: Secondary | ICD-10-CM

## 2014-08-10 NOTE — Progress Notes (Signed)
Subjective:    Patient ID: Carla Wheeler, female    DOB: [birth year], 39 y.o.   MRN: [id number]  HPI  Pt presents to the clinic today with c/o low back pain.  This has been going on for the last few months. Her pain has become more consistent over the last 2-3 days. She describes the pain as achy. The pain does radiate to her lower abdomen. She denies urinary or vaginal complaints. She is constipated at times. She denies numbness or tingling down the legs. Changing positions seems to make worse. Laying down seems to make it worse. She denies any injury to her back that she is aware of. She has been taking Ibuprofen and tried massage without much relief. She would like a xray.  Review of Systems  Past Medical History  Diagnosis Date  . Thyroid disease   . Asthma   . Depression     Current Outpatient Prescriptions  Medication Sig Dispense Refill  . albuterol (PROVENTIL HFA;VENTOLIN HFA) 108 (90 BASE) MCG/ACT inhaler Inhale 2 puffs into the lungs every 6 (six) hours as needed for wheezing or shortness of breath. 1 Inhaler 2  . ferrous sulfate 325 (65 FE) MG tablet Take 325 mg by mouth daily with breakfast.    . Fluticasone-Salmeterol (ADVAIR DISKUS) 250-50 MCG/DOSE AEPB Inhale 1 puff into the lungs 2 (two) times daily.    Marland Kitchen guaiFENesin (MUCINEX) 600 MG 12 hr tablet Take 1-2 tablets by mouth every 12 hours as needed for congestion no longer than 4 days    . levothyroxine (SYNTHROID, LEVOTHROID) 100 MCG tablet TAKE 1 TABLET (100 MCG TOTAL) BY MOUTH DAILY BEFORE BREAKFAST. 30 tablet 4  . phentermine 15 MG capsule Take 1 capsule (15 mg total) by mouth every morning. 30 capsule 0  . triamcinolone (NASACORT) 55 MCG/ACT AERO nasal inhaler Place into the nose.     No current facility-administered medications for this visit.    Allergies  Allergen Reactions  . Augmentin [Amoxicillin-Pot Clavulanate] Other (See Comments)    Muscle cramps    Family History  Problem Relation Age of Onset    . Cancer Mother     Breast  . Arthritis Father   . Heart disease Father   . Stroke Father   . Hypertension Father   . Diabetes Father   . Congestive Heart Failure Father   . Cancer Maternal Grandmother     Breast  . Hypertension Maternal Grandmother   . Hypertension Paternal Grandmother   . Learning disabilities Paternal Grandmother     History   Social History  . Marital Status: Married    Spouse Name: N/A  . Number of Children: N/A  . Years of Education: N/A   Occupational History  . Not on file.   Social History Main Topics  . Smoking status: Former Smoker    Types: Cigars  . Smokeless tobacco: Never Used  . Alcohol Use: 1.8 oz/week    3 Glasses of wine per week     Comment: occasional  . Drug Use: No  . Sexual Activity: Yes   Other Topics Concern  . Not on file   Social History Narrative     Constitutional: Denies fever, malaise, fatigue, headache or abrupt weight changes.  Respiratory: Denies difficulty breathing, shortness of breath, cough or sputum production.   Cardiovascular: Denies chest pain, chest tightness, palpitations or swelling in the hands or feet.  Gastrointestinal: Denies abdominal pain, bloating, constipation, diarrhea or blood in the stool.  GU: Denies urgency, frequency, pain with urination, burning sensation, blood in urine, odor or discharge. Musculoskeletal: Pt reports low back pain. Denies decrease in range of motion, difficulty with gait, muscle pain or joint swelling.  Skin: Denies redness, rashes, lesions or ulcercations.  Neurological: Denies dizziness, difficulty with memory, difficulty with speech or problems with balance and coordination.   No other specific complaints in a complete review of systems (except as listed in HPI above).     Objective:   Physical Exam   BP 118/84 mmHg  Pulse 75  Temp(Src) 99 F (37.2 C) (Oral)  Wt 192 lb (87.091 kg)  SpO2 98%  LMP 08/06/2014 Wt Readings from Last 3 Encounters:  08/10/14  192 lb (87.091 kg)  06/26/14 194 lb (87.998 kg)  04/27/14 192 lb 8 oz (87.317 kg)    General: Appears her stated age, obese in NAD. Skin: Warm, dry and intact. No rashes, lesions or ulcerations noted. Cardiovascular: Normal rate and rhythm. S1,S2 noted.  No murmur, rubs or gallops noted.  Pulmonary/Chest: Normal effort and positive vesicular breath sounds. No respiratory distress. No wheezes, rales or ronchi noted.  Abdomen: Soft and nontender. Normal bowel sounds, no bruits noted.  Musculoskeletal: Normal flexion, extension and rotation of the spine, but pain noted with all ROM. Tenderness to palpation over the lumbar spine. Strength 5/5 BLE.  No difficulty with gait.  Neurological: Alert and oriented.   BMET    Component Value Date/Time   NA 136 09/07/2013 0909   K 4.3 09/07/2013 0909   CL 105 09/07/2013 0909   CO2 26 09/07/2013 0909   GLUCOSE 96 09/07/2013 0909   BUN 10 09/07/2013 0909   CREATININE 0.7 09/07/2013 0909   CALCIUM 8.6 09/07/2013 0909    Lipid Panel     Component Value Date/Time   CHOL 164 09/07/2013 0909   TRIG 96.0 09/07/2013 0909   HDL 42.20 09/07/2013 0909   CHOLHDL 4 09/07/2013 0909   VLDL 19.2 09/07/2013 0909   LDLCALC 103* 09/07/2013 0909    CBC    Component Value Date/Time   WBC 5.6 06/26/2014 1629   RBC 4.46 06/26/2014 1629   HGB 10.7* 06/26/2014 1629   HCT 32.9* 06/26/2014 1629   PLT 322.0 06/26/2014 1629   MCV 73.8* 06/26/2014 1629   MCHC 32.5 06/26/2014 1629   RDW 17.8* 06/26/2014 1629    Hgb A1C Lab Results  Component Value Date   HGBA1C 5.7 09/07/2013        Assessment & Plan:   Midline low back pain without sciatica:  Given duration of symptoms, will check xray today Continue Ibuprofen at this time Stretching exercises given  Will follow up after xray, RTC as needed or if symptoms persist or worsen

## 2014-08-10 NOTE — Progress Notes (Signed)
Pre visit review using our clinic review tool, if applicable. No additional management support is needed unless otherwise documented below in the visit note. 

## 2014-08-10 NOTE — Patient Instructions (Signed)
Back Exercises These exercises may help you when beginning to rehabilitate your injury. Your symptoms may resolve with or without further involvement from your physician, physical therapist or athletic trainer. While completing these exercises, remember:   Restoring tissue flexibility helps normal motion to return to the joints. This allows healthier, less painful movement and activity.  An effective stretch should be held for at least 30 seconds.  A stretch should never be painful. You should only feel a gentle lengthening or release in the stretched tissue. STRETCH - Extension, Prone on Elbows   Lie on your stomach on the floor, a bed will be too soft. Place your palms about shoulder width apart and at the height of your head.  Place your elbows under your shoulders. If this is too painful, stack pillows under your chest.  Allow your body to relax so that your hips drop lower and make contact more completely with the floor.  Hold this position for __________ seconds.  Slowly return to lying flat on the floor. Repeat __________ times. Complete this exercise __________ times per day.  RANGE OF MOTION - Extension, Prone Press Ups   Lie on your stomach on the floor, a bed will be too soft. Place your palms about shoulder width apart and at the height of your head.  Keeping your back as relaxed as possible, slowly straighten your elbows while keeping your hips on the floor. You may adjust the placement of your hands to maximize your comfort. As you gain motion, your hands will come more underneath your shoulders.  Hold this position __________ seconds.  Slowly return to lying flat on the floor. Repeat __________ times. Complete this exercise __________ times per day.  RANGE OF MOTION- Quadruped, Neutral Spine   Assume a hands and knees position on a firm surface. Keep your hands under your shoulders and your knees under your hips. You may place padding under your knees for  comfort.  Drop your head and point your tail bone toward the ground below you. This will round out your low back like an angry cat. Hold this position for __________ seconds.  Slowly lift your head and release your tail bone so that your back sags into a large arch, like an old horse.  Hold this position for __________ seconds.  Repeat this until you feel limber in your low back.  Now, find your "sweet spot." This will be the most comfortable position somewhere between the two previous positions. This is your neutral spine. Once you have found this position, tense your stomach muscles to support your low back.  Hold this position for __________ seconds. Repeat __________ times. Complete this exercise __________ times per day.  STRETCH - Flexion, Single Knee to Chest   Lie on a firm bed or floor with both legs extended in front of you.  Keeping one leg in contact with the floor, bring your opposite knee to your chest. Hold your leg in place by either grabbing behind your thigh or at your knee.  Pull until you feel a gentle stretch in your low back. Hold __________ seconds.  Slowly release your grasp and repeat the exercise with the opposite side. Repeat __________ times. Complete this exercise __________ times per day.  STRETCH - Hamstrings, Standing  Stand or sit and extend your right / left leg, placing your foot on a chair or foot stool  Keeping a slight arch in your low back and your hips straight forward.  Lead with your chest and   lean forward at the waist until you feel a gentle stretch in the back of your right / left knee or thigh. (When done correctly, this exercise requires leaning only a small distance.)  Hold this position for __________ seconds. Repeat __________ times. Complete this stretch __________ times per day. STRENGTHENING - Deep Abdominals, Pelvic Tilt   Lie on a firm bed or floor. Keeping your legs in front of you, bend your knees so they are both pointed  toward the ceiling and your feet are flat on the floor.  Tense your lower abdominal muscles to press your low back into the floor. This motion will rotate your pelvis so that your tail bone is scooping upwards rather than pointing at your feet or into the floor.  With a gentle tension and even breathing, hold this position for __________ seconds. Repeat __________ times. Complete this exercise __________ times per day.  STRENGTHENING - Abdominals, Crunches   Lie on a firm bed or floor. Keeping your legs in front of you, bend your knees so they are both pointed toward the ceiling and your feet are flat on the floor. Cross your arms over your chest.  Slightly tip your chin down without bending your neck.  Tense your abdominals and slowly lift your trunk high enough to just clear your shoulder blades. Lifting higher can put excessive stress on the low back and does not further strengthen your abdominal muscles.  Control your return to the starting position. Repeat __________ times. Complete this exercise __________ times per day.  STRENGTHENING - Quadruped, Opposite UE/LE Lift   Assume a hands and knees position on a firm surface. Keep your hands under your shoulders and your knees under your hips. You may place padding under your knees for comfort.  Find your neutral spine and gently tense your abdominal muscles so that you can maintain this position. Your shoulders and hips should form a rectangle that is parallel with the floor and is not twisted.  Keeping your trunk steady, lift your right hand no higher than your shoulder and then your left leg no higher than your hip. Make sure you are not holding your breath. Hold this position __________ seconds.  Continuing to keep your abdominal muscles tense and your back steady, slowly return to your starting position. Repeat with the opposite arm and leg. Repeat __________ times. Complete this exercise __________ times per day. Document Released:  02/13/2005 Document Revised: 04/20/2011 Document Reviewed: 05/10/2008 Exit[employer] Patient Information 2015 [employer]. This information is not intended to replace advice given to you by your health care provider. Make sure you discuss any questions you have with your health care provider.  

## 2014-08-24 ENCOUNTER — Other Ambulatory Visit: Payer: Self-pay

## 2014-08-24 MED ORDER — LEVOTHYROXINE SODIUM 100 MCG PO TABS: ORAL_TABLET | ORAL | Status: DC

## 2014-09-27 ENCOUNTER — Other Ambulatory Visit: Payer: Self-pay | Admitting: Internal Medicine

## 2014-10-01 ENCOUNTER — Ambulatory Visit (INDEPENDENT_AMBULATORY_CARE_PROVIDER_SITE_OTHER): Payer: BC Managed Care – PPO | Admitting: Internal Medicine

## 2014-10-01 ENCOUNTER — Encounter: Payer: Self-pay | Admitting: Internal Medicine

## 2014-10-01 ENCOUNTER — Ambulatory Visit (INDEPENDENT_AMBULATORY_CARE_PROVIDER_SITE_OTHER)
Admission: RE | Admit: 2014-10-01 | Discharge: 2014-10-01 | Disposition: A | Discharge status: Home / Self Care | Payer: BC Managed Care – PPO | Source: Ambulatory Visit | Attending: Internal Medicine | Admitting: Internal Medicine

## 2014-10-01 VITALS — BP 120/80 | HR 72 | Temp 97.7°F | Wt 194.0 lb

## 2014-10-01 DIAGNOSIS — M79642 Pain in left hand: Secondary | ICD-10-CM

## 2014-10-01 NOTE — Patient Instructions (Signed)
Please continue the ibuprofen 600mg  three times a day with food until the pain is much better

## 2014-10-01 NOTE — Progress Notes (Signed)
Pre visit review using our clinic review tool, if applicable. No additional management support is needed unless otherwise documented below in the visit note. 

## 2014-10-01 NOTE — Assessment & Plan Note (Addendum)
Has suffered with this for a week X-ray doesn't appear to show fracture--will await radiology over read Will put in brace Ibuprofen 600mg  tid for the next 1-2 weeks
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
## 2014-10-10 ENCOUNTER — Encounter: Payer: Self-pay | Admitting: Cardiovascular Disease

## 2015-06-25 ENCOUNTER — Other Ambulatory Visit: Payer: Self-pay | Admitting: Family Medicine

## 2015-06-25 NOTE — Telephone Encounter (Signed)
Last office visit 10/01/2014 with Dr. Silvio Pate.  Last refilled 04/27/2014 for 1 inhaler with 2 refills.  Ok to refill?

## 2015-07-10 ENCOUNTER — Other Ambulatory Visit: Payer: Self-pay | Admitting: Internal Medicine

## 2015-07-19 ENCOUNTER — Encounter: Payer: Self-pay | Admitting: Internal Medicine

## 2015-07-19 ENCOUNTER — Ambulatory Visit (INDEPENDENT_AMBULATORY_CARE_PROVIDER_SITE_OTHER): Payer: BC Managed Care – PPO | Admitting: Internal Medicine

## 2015-07-19 VITALS — BP 124/74 | HR 72 | Temp 98.9°F | Ht 61.0 in | Wt 197.2 lb

## 2015-07-19 DIAGNOSIS — J4520 Mild intermittent asthma, uncomplicated: Secondary | ICD-10-CM

## 2015-07-19 DIAGNOSIS — Z0000 Encounter for general adult medical examination without abnormal findings: Secondary | ICD-10-CM

## 2015-07-19 DIAGNOSIS — R635 Abnormal weight gain: Secondary | ICD-10-CM | POA: Diagnosis not present

## 2015-07-19 DIAGNOSIS — Z0001 Encounter for general adult medical examination with abnormal findings: Secondary | ICD-10-CM

## 2015-07-19 DIAGNOSIS — J4521 Mild intermittent asthma with (acute) exacerbation: Secondary | ICD-10-CM

## 2015-07-19 DIAGNOSIS — D509 Iron deficiency anemia, unspecified: Secondary | ICD-10-CM | POA: Insufficient documentation

## 2015-07-19 DIAGNOSIS — E039 Hypothyroidism, unspecified: Secondary | ICD-10-CM | POA: Diagnosis not present

## 2015-07-19 HISTORY — DX: Mild intermittent asthma with (acute) exacerbation: J45.21

## 2015-07-19 LAB — TSH: TSH: 6.07 mIU/L — ABNORMAL HIGH

## 2015-07-19 LAB — CBC
HCT: 32.8 % — ABNORMAL LOW (ref 35.0–45.0)
Hemoglobin: 10.1 g/dL — ABNORMAL LOW (ref 11.7–15.5)
MCH: 23.2 pg — ABNORMAL LOW (ref 27.0–33.0)
MCHC: 30.8 g/dL — ABNORMAL LOW (ref 32.0–36.0)
MCV: 75.4 fL — ABNORMAL LOW (ref 80.0–100.0)
MPV: 9.6 fL (ref 7.5–12.5)
Platelets: 339 10*3/uL (ref 140–400)
RBC: 4.35 MIL/uL (ref 3.80–5.10)
RDW: 16.3 % — ABNORMAL HIGH (ref 11.0–15.0)
WBC: 6 10*3/uL (ref 3.8–10.8)

## 2015-07-19 LAB — LIPID PANEL
CHOL/HDL RATIO: 4.1 ratio (ref <=5.0)
Cholesterol: 176 mg/dL (ref 125–200)
HDL: 43 mg/dL — AB (ref >=46)
LDL CALC: 114 mg/dL (ref <130)
Triglycerides: 93 mg/dL (ref <150)
VLDL: 19 mg/dL (ref <30)

## 2015-07-19 LAB — COMPREHENSIVE METABOLIC PANEL
ALK PHOS: 41 U/L (ref 33–115)
ALT: 10 U/L (ref 6–29)
AST: 13 U/L (ref 10–30)
Albumin: 4 g/dL (ref 3.6–5.1)
BUN: 13 mg/dL (ref 7–25)
CALCIUM: 9.1 mg/dL (ref 8.6–10.2)
CHLORIDE: 104 mmol/L (ref 98–110)
CO2: 24 mmol/L (ref 20–31)
Creat: 0.79 mg/dL (ref 0.50–1.10)
GLUCOSE: 85 mg/dL (ref 65–99)
POTASSIUM: 4.4 mmol/L (ref 3.5–5.3)
Sodium: 139 mmol/L (ref 135–146)
Total Bilirubin: 0.2 mg/dL (ref 0.2–1.2)
Total Protein: 7.1 g/dL (ref 6.1–8.1)

## 2015-07-19 LAB — T4, FREE: Free T4: 1 ng/dL (ref 0.8–1.8)

## 2015-07-19 MED ORDER — PHENTERMINE HCL 37.5 MG PO CAPS: 37.5000 mg | ORAL_CAPSULE | ORAL | Status: DC

## 2015-07-19 MED ORDER — LEVOTHYROXINE SODIUM 100 MCG PO TABS: ORAL_TABLET | ORAL | Status: DC

## 2015-07-19 NOTE — Assessment & Plan Note (Signed)
Will check CBC today Continue iron supplement

## 2015-07-19 NOTE — Assessment & Plan Note (Signed)
Will check TSH and T4 today Synthroid refilled today

## 2015-07-19 NOTE — Addendum Note (Signed)
Addended by: Ellamae Sia on: 07/19/2015 03:12 PM   Modules accepted: Miquel Dunn

## 2015-07-19 NOTE — Progress Notes (Signed)
Subjective:    Patient ID: Carla Wheeler, female    DOB: 1976/01/07, 40 y.o.   MRN: [id number]  HPI  Pt presents to the clinic today for her annual exam.  Flu: never Tetanus: 09/07/2013 Pap Smear: 2010 Mammogram: 2016 Vision Screening: as needed Dentist: annually  Diet: She does eat meat. She consumes fruits veggies daily, fruits a few times per week. She does consume some fried food. She drinks mostly water and coffee. Exercise: None  Hypothyroidism: She is taking her Synthroid as prescribed. She denies depression, constipation, weight gain, cold intolerance or dry skin.  Asthma, mild intermittent: She only uses the Advair and Albuterol as needed  Iron deficiency anemia: She takes the iron supplement as prescribed. She has no concerns about constipation.  She also wants to restart the Phentermine.  Review of Systems      Past Medical History  Diagnosis Date  . Thyroid disease   . Asthma   . Depression     Current Outpatient Prescriptions  Medication Sig Dispense Refill  . ferrous sulfate 325 (65 FE) MG tablet Take 325 mg by mouth daily with breakfast.    . Fluticasone-Salmeterol (ADVAIR DISKUS) 250-50 MCG/DOSE AEPB Inhale 1 puff into the lungs 2 (two) times daily.    Marland Kitchen levothyroxine (SYNTHROID, LEVOTHROID) 100 MCG tablet TAKE 1 TABLET (100 MCG TOTAL) BY MOUTH DAILY BEFORE BREAKFAST. 30 tablet 5  . phentermine 15 MG capsule Take 1 capsule (15 mg total) by mouth every morning. 30 capsule 0  . PROAIR HFA 108 (90 Base) MCG/ACT inhaler INHALE 2 PUFFS INTO THE LUNGS EVERY 6 (SIX) HOURS AS NEEDED FOR WHEEZING OR SHORTNESS OF BREATH. 8.5 Inhaler 0   No current facility-administered medications for this visit.    Allergies  Allergen Reactions  . Augmentin [Amoxicillin-Pot Clavulanate] Other (See Comments)    Muscle cramps    Family History  Problem Relation Age of Onset  . Cancer Mother     Breast  . Arthritis Father   . Heart disease Father   . Stroke Father    . Hypertension Father   . Diabetes Father   . Congestive Heart Failure Father   . Cancer Maternal Grandmother     Breast  . Hypertension Maternal Grandmother   . Hypertension Paternal Grandmother   . Learning disabilities Paternal Grandmother     Social History   Social History  . Marital Status: Married    Spouse Name: N/A  . Number of Children: N/A  . Years of Education: N/A   Occupational History  . Not on file.   Social History Main Topics  . Smoking status: Former Smoker    Types: Cigars  . Smokeless tobacco: Never Used  . Alcohol Use: 1.8 oz/week    3 Glasses of wine per week     Comment: occasional  . Drug Use: No  . Sexual Activity: Yes   Other Topics Concern  . Not on file   Social History Narrative     Constitutional: Pt reports weight gain. Denies fever, malaise, fatigue, headache or abrupt weight changes.  HEENT: Denies eye pain, eye redness, ear pain, ringing in the ears, wax buildup, runny nose, nasal congestion, bloody nose, or sore throat. Respiratory: Denies difficulty breathing, shortness of breath, cough or sputum production.   Cardiovascular: Denies chest pain, chest tightness, palpitations or swelling in the hands or feet.  Gastrointestinal: Denies abdominal pain, bloating, constipation, diarrhea or blood in the stool.  GU: Denies urgency, frequency, pain with  urination, burning sensation, blood in urine, odor or discharge. Musculoskeletal: Pt reports low back pain. Denies decrease in range of motion, difficulty with gait, or joint pain and swelling.  Skin: Denies redness, rashes, lesions or ulcercations.  Neurological: Denies dizziness, difficulty with memory, difficulty with speech or problems with balance and coordination.  Psych: Denies anxiety, depression, SI/HI.  No other specific complaints in a complete review of systems (except as listed in HPI above).  Objective:   Physical Exam   BP 124/74 mmHg  Pulse 72  Temp(Src) 98.9 F  (37.2 C) (Oral)  Ht 5\' 1"  (1.549 m)  Wt 197 lb 4 oz (89.472 kg)  BMI 37.29 kg/m2  SpO2 98% Wt Readings from Last 3 Encounters:  07/19/15 197 lb 4 oz (89.472 kg)  10/01/14 194 lb (87.998 kg)  08/10/14 192 lb (87.091 kg)    General: Appears her stated age, obese in NAD. Skin: Warm, dry and intact.  HEENT: Head: normal shape and size; Eyes: sclera white, no icterus, conjunctiva pink, PERRLA and EOMs intact; Ears: Tm's gray and intact, normal light reflex; Throat/Mouth: Teeth present, mucosa pink and moist, no exudate, lesions or ulcerations noted.  Neck:  Neck supple, trachea midline. No masses, lumps or thyromegaly present.  Cardiovascular: Normal rate and rhythm. S1,S2 noted.  No murmur, rubs or gallops noted. No JVD or BLE edema.  Pulmonary/Chest: Normal effort and positive vesicular breath sounds. No respiratory distress. No wheezes, rales or ronchi noted.  Abdomen: Soft and nontender. Normal bowel sounds. No distention or masses noted. Liver, spleen and kidneys non palpable. Musculoskeletal: Strength 5/5 BUE/BLE. No signs of joint swelling. No difficulty with gait.  Neurological: Alert and oriented. Cranial nerves II-XII grossly intact. Coordination normal.  Psychiatric: Mood and affect normal. Behavior is normal. Judgment and thought content normal.    BMET    Component Value Date/Time   NA 136 09/07/2013 0909   K 4.3 09/07/2013 0909   CL 105 09/07/2013 0909   CO2 26 09/07/2013 0909   GLUCOSE 96 09/07/2013 0909   BUN 10 09/07/2013 0909   CREATININE 0.7 09/07/2013 0909   CALCIUM 8.6 09/07/2013 0909    Lipid Panel     Component Value Date/Time   CHOL 164 09/07/2013 0909   TRIG 96.0 09/07/2013 0909   HDL 42.20 09/07/2013 0909   CHOLHDL 4 09/07/2013 0909   VLDL 19.2 09/07/2013 0909   LDLCALC 103* 09/07/2013 0909    CBC    Component Value Date/Time   WBC 5.6 06/26/2014 1629   RBC 4.46 06/26/2014 1629   HGB 10.7* 06/26/2014 1629   HCT 32.9* 06/26/2014 1629   PLT  322.0 06/26/2014 1629   MCV 73.8* 06/26/2014 1629   MCHC 32.5 06/26/2014 1629   RDW 17.8* 06/26/2014 1629    Hgb A1C Lab Results  Component Value Date   HGBA1C 5.7 09/07/2013        Assessment & Plan:   Preventative Health Maintenance:  She declines flu shot Tetanus UTD Pap cannot be done today because she is on her menstrual, she will reschedule She will call [facility] to schedule her mammogram Encouraged her to see an eye doctor and dentist at least annually Advised her to consume a balance diet and start an exercise regimen  Abnormal Weight Gain:  Discussed the importance of healthy diet and exercise RX for Phentermine 37.5 mg daily  RTC in 1 month for weight check/med refill

## 2015-07-19 NOTE — Patient Instructions (Signed)
Health Maintenance, Female Adopting a healthy lifestyle and getting preventive care can go a long way to promote health and wellness. Talk with your health care provider about what schedule of regular examinations is right for you. This is a good chance for you to check in with your provider about disease prevention and staying healthy. In between checkups, there are plenty of things you can do on your own. Experts have done a lot of research about which lifestyle changes and preventive measures are most likely to keep you healthy. Ask your health care provider for more information. WEIGHT AND DIET  Eat a healthy diet  Be sure to include plenty of vegetables, fruits, low-fat dairy products, and lean protein.  Do not eat a lot of foods high in solid fats, added sugars, or salt.  Get regular exercise. This is one of the most important things you can do for your health.  Most adults should exercise for at least 150 minutes each week. The exercise should increase your heart rate and make you sweat (moderate-intensity exercise).  Most adults should also do strengthening exercises at least twice a week. This is in addition to the moderate-intensity exercise.  Maintain a healthy weight  Body mass index (BMI) is a measurement that can be used to identify possible weight problems. It estimates body fat based on height and weight. Your health care provider can help determine your BMI and help you achieve or maintain a healthy weight.  For females 20 years of age and older:   A BMI below 18.5 is considered underweight.  A BMI of 18.5 to 24.9 is normal.  A BMI of 25 to 29.9 is considered overweight.  A BMI of 30 and above is considered obese.  Watch levels of cholesterol and blood lipids  You should start having your blood tested for lipids and cholesterol at 42 years of age, then have this test every 5 years.  You may need to have your cholesterol levels checked more often if:  Your lipid  or cholesterol levels are high.  You are older than 42 years of age.  You are at high risk for heart disease.  CANCER SCREENING   Lung Cancer  Lung cancer screening is recommended for adults 55-80 years old who are at high risk for lung cancer because of a history of smoking.  A yearly low-dose CT scan of the lungs is recommended for people who:  Currently smoke.  Have quit within the past 15 years.  Have at least a 30-pack-year history of smoking. A pack year is smoking an average of one pack of cigarettes a day for 1 year.  Yearly screening should continue until it has been 15 years since you quit.  Yearly screening should stop if you develop a health problem that would prevent you from having lung cancer treatment.  Breast Cancer  Practice breast self-awareness. This means understanding how your breasts normally appear and feel.  It also means doing regular breast self-exams. Let your health care provider know about any changes, no matter how small.  If you are in your 20s or 30s, you should have a clinical breast exam (CBE) by a health care provider every 1-3 years as part of a regular health exam.  If you are 40 or older, have a CBE every year. Also consider having a breast X-ray (mammogram) every year.  If you have a family history of breast cancer, talk to your health care provider about genetic screening.  If you   are at high risk for breast cancer, talk to your health care provider about having an MRI and a mammogram every year.  Breast cancer gene (BRCA) assessment is recommended for women who have family members with BRCA-related cancers. BRCA-related cancers include:  Breast.  Ovarian.  Tubal.  Peritoneal cancers.  Results of the assessment will determine the need for genetic counseling and BRCA1 and BRCA2 testing. Cervical Cancer Your health care provider may recommend that you be screened regularly for cancer of the pelvic organs (ovaries, uterus, and  vagina). This screening involves a pelvic examination, including checking for microscopic changes to the surface of your cervix (Pap test). You may be encouraged to have this screening done every 3 years, beginning at age 21.  For women ages 30-65, health care providers may recommend pelvic exams and Pap testing every 3 years, or they may recommend the Pap and pelvic exam, combined with testing for human papilloma virus (HPV), every 5 years. Some types of HPV increase your risk of cervical cancer. Testing for HPV may also be done on women of any age with unclear Pap test results.  Other health care providers may not recommend any screening for nonpregnant women who are considered low risk for pelvic cancer and who do not have symptoms. Ask your health care provider if a screening pelvic exam is right for you.  If you have had past treatment for cervical cancer or a condition that could lead to cancer, you need Pap tests and screening for cancer for at least 20 years after your treatment. If Pap tests have been discontinued, your risk factors (such as having a new sexual partner) need to be reassessed to determine if screening should resume. Some women have medical problems that increase the chance of getting cervical cancer. In these cases, your health care provider may recommend more frequent screening and Pap tests. Colorectal Cancer  This type of cancer can be detected and often prevented.  Routine colorectal cancer screening usually begins at 42 years of age and continues through 42 years of age.  Your health care provider may recommend screening at an earlier age if you have risk factors for colon cancer.  Your health care provider may also recommend using home test kits to check for hidden blood in the stool.  A small camera at the end of a tube can be used to examine your colon directly (sigmoidoscopy or colonoscopy). This is done to check for the earliest forms of colorectal  cancer.  Routine screening usually begins at age 50.  Direct examination of the colon should be repeated every 5-10 years through 42 years of age. However, you may need to be screened more often if early forms of precancerous polyps or small growths are found. Skin Cancer  Check your skin from head to toe regularly.  Tell your health care provider about any new moles or changes in moles, especially if there is a change in a mole's shape or color.  Also tell your health care provider if you have a mole that is larger than the size of a pencil eraser.  Always use sunscreen. Apply sunscreen liberally and repeatedly throughout the day.  Protect yourself by wearing long sleeves, pants, a wide-brimmed hat, and sunglasses whenever you are outside. HEART DISEASE, DIABETES, AND HIGH BLOOD PRESSURE   High blood pressure causes heart disease and increases the risk of stroke. High blood pressure is more likely to develop in:  People who have blood pressure in the high end   of the normal range (130-139/85-89 mm Hg).  People who are overweight or obese.  People who are African American.  If you are 38-23 years of age, have your blood pressure checked every 3-5 years. If you are 61 years of age or older, have your blood pressure checked every year. You should have your blood pressure measured twice--once when you are at a hospital or clinic, and once when you are not at a hospital or clinic. Record the average of the two measurements. To check your blood pressure when you are not at a hospital or clinic, you can use:  An automated blood pressure machine at a pharmacy.  A home blood pressure monitor.  If you are between 45 years and 39 years old, ask your health care provider if you should take aspirin to prevent strokes.  Have regular diabetes screenings. This involves taking a blood sample to check your fasting blood sugar level.  If you are at a normal weight and have a low risk for diabetes,  have this test once every three years after 42 years of age.  If you are overweight and have a high risk for diabetes, consider being tested at a younger age or more often. PREVENTING INFECTION  Hepatitis B  If you have a higher risk for hepatitis B, you should be screened for this virus. You are considered at high risk for hepatitis B if:  You were born in a country where hepatitis B is common. Ask your health care provider which countries are considered high risk.  Your parents were born in a high-risk country, and you have not been immunized against hepatitis B (hepatitis B vaccine).  You have HIV or AIDS.  You use needles to inject street drugs.  You live with someone who has hepatitis B.  You have had sex with someone who has hepatitis B.  You get hemodialysis treatment.  You take certain medicines for conditions, including cancer, organ transplantation, and autoimmune conditions. Hepatitis C  Blood testing is recommended for:  Everyone born from [birth year] through 1967.  Anyone with known risk factors for hepatitis C. Sexually transmitted infections (STIs)  You should be screened for sexually transmitted infections (STIs) including gonorrhea and chlamydia if:  You are sexually active and are younger than 42 years of age.  You are older than 42 years of age and your health care provider tells you that you are at risk for this type of infection.  Your sexual activity has changed since you were last screened and you are at an increased risk for chlamydia or gonorrhea. Ask your health care provider if you are at risk.  If you do not have HIV, but are at risk, it may be recommended that you take a prescription medicine daily to prevent HIV infection. This is called pre-exposure prophylaxis (PrEP). You are considered at risk if:  You are sexually active and do not regularly use condoms or know the HIV status of your partner(s).  You take drugs by injection.  You are sexually  active with a partner who has HIV. Talk with your health care provider about whether you are at high risk of being infected with HIV. If you choose to begin PrEP, you should first be tested for HIV. You should then be tested every 3 months for as long as you are taking PrEP.  PREGNANCY   If you are premenopausal and you may become pregnant, ask your health care provider about preconception counseling.  If you may  become pregnant, take 400 to 800 micrograms (mcg) of folic acid every day.  If you want to prevent pregnancy, talk to your health care provider about birth control (contraception). OSTEOPOROSIS AND MENOPAUSE   Osteoporosis is a disease in which the bones lose minerals and strength with aging. This can result in serious bone fractures. Your risk for osteoporosis can be identified using a bone density scan.  If you are 61 years of age or older, or if you are at risk for osteoporosis and fractures, ask your health care provider if you should be screened.  Ask your health care provider whether you should take a calcium or vitamin D supplement to lower your risk for osteoporosis.  Menopause may have certain physical symptoms and risks.  Hormone replacement therapy may reduce some of these symptoms and risks. Talk to your health care provider about whether hormone replacement therapy is right for you.  HOME CARE INSTRUCTIONS   Schedule regular health, dental, and eye exams.  Stay current with your immunizations.   Do not use any tobacco products including cigarettes, chewing tobacco, or electronic cigarettes.  If you are pregnant, do not drink alcohol.  If you are breastfeeding, limit how much and how often you drink alcohol.  Limit alcohol intake to no more than 1 drink per day for nonpregnant women. One drink equals 12 ounces of beer, 5 ounces of wine, or 1 ounces of hard liquor.  Do not use street drugs.  Do not share needles.  Ask your health care provider for help if  you need support or information about quitting drugs.  Tell your health care provider if you often feel depressed.  Tell your health care provider if you have ever been abused or do not feel safe at home.   This information is not intended to replace advice given to you by your health care provider. Make sure you discuss any questions you have with your health care provider.   Document Released: 08/11/2010 Document Revised: 02/16/2014 Document Reviewed: 12/28/2012 Elsevier Interactive Patient Education [employer].

## 2015-07-19 NOTE — Assessment & Plan Note (Signed)
Advised her Advair is not used as needed- will d/c Continue Albuterol prn

## 2015-07-19 NOTE — Progress Notes (Signed)
Pre visit review using our clinic review tool, if applicable. No additional management support is needed unless otherwise documented below in the visit note. 

## 2015-08-19 ENCOUNTER — Ambulatory Visit (INDEPENDENT_AMBULATORY_CARE_PROVIDER_SITE_OTHER): Payer: BC Managed Care – PPO | Admitting: Internal Medicine

## 2015-08-19 ENCOUNTER — Encounter: Payer: Self-pay | Admitting: Internal Medicine

## 2015-08-19 VITALS — BP 122/76 | HR 71 | Temp 98.6°F | Wt 190.0 lb

## 2015-08-19 DIAGNOSIS — R635 Abnormal weight gain: Secondary | ICD-10-CM

## 2015-08-19 MED ORDER — PHENTERMINE HCL 37.5 MG PO CAPS: 37.5000 mg | ORAL_CAPSULE | ORAL | Status: DC

## 2015-08-19 NOTE — Patient Instructions (Signed)

## 2015-08-19 NOTE — Progress Notes (Signed)
Subjective:    Patient ID: Carla Wheeler, female    DOB: 09-17-1975, 40 y.o.   MRN: [id number]  HPI  Pt presents to the clinic today for 1 month weight check/med refill. She was started on Phentermine at her last visit. Her starting weight was 197.25, BMI of 37.29. She has been taking the medication as directed and has not noticed any side effects. She lost 7 lbs over  The last month. Her weight today is 190 lbs with a BMI of 35.92. She has been eating more grilled and baked meats instead of fried. She has tried to increase more vegetables and salads. She is drinking 6-8 glasses of water daily, and cut back on soda.  Breakfast: omelet, orange juice Lunch: sandwich and salad Dinner: grilled meat, veggies Snacks: fruit, smoothies, granola bar Exercise: none  Review of Systems      Past Medical History  Diagnosis Date  . Thyroid disease   . Asthma   . Depression     Current Outpatient Prescriptions  Medication Sig Dispense Refill  . ferrous sulfate 325 (65 FE) MG tablet Take 325 mg by mouth daily with breakfast.    . levothyroxine (SYNTHROID, LEVOTHROID) 100 MCG tablet TAKE 1 TABLET (100 MCG TOTAL) BY MOUTH DAILY BEFORE BREAKFAST. 30 tablet 11  . phentermine 37.5 MG capsule Take 1 capsule (37.5 mg total) by mouth every morning. 30 capsule 0  . PROAIR HFA 108 (90 Base) MCG/ACT inhaler INHALE 2 PUFFS INTO THE LUNGS EVERY 6 (SIX) HOURS AS NEEDED FOR WHEEZING OR SHORTNESS OF BREATH. 8.5 Inhaler 0   No current facility-administered medications for this visit.    Allergies  Allergen Reactions  . Augmentin [Amoxicillin-Pot Clavulanate] Other (See Comments)    Muscle cramps    Family History  Problem Relation Age of Onset  . Cancer Mother     Breast  . Arthritis Father   . Heart disease Father   . Stroke Father   . Hypertension Father   . Diabetes Father   . Congestive Heart Failure Father   . Cancer Maternal Grandmother     Breast  . Hypertension Maternal Grandmother    . Hypertension Paternal Grandmother   . Learning disabilities Paternal Grandmother     Social History   Social History  . Marital Status: Married    Spouse Name: N/A  . Number of Children: N/A  . Years of Education: N/A   Occupational History  . Not on file.   Social History Main Topics  . Smoking status: Former Smoker    Types: Cigars  . Smokeless tobacco: Never Used  . Alcohol Use: 1.8 oz/week    3 Glasses of wine per week     Comment: occasional  . Drug Use: No  . Sexual Activity: Yes   Other Topics Concern  . Not on file   Social History Narrative     Constitutional: Denies fever, malaise, fatigue, headache or abrupt weight changes.  Respiratory: Denies difficulty breathing, shortness of breath, cough or sputum production.   Cardiovascular: Denies chest pain, chest tightness, palpitations or swelling in the hands or feet.  Neurological: Denies dizziness, difficulty with memory, difficulty with speech or problems with balance and coordination.    No other specific complaints in a complete review of systems (except as listed in HPI above).  Objective:   Physical Exam   BP 122/76 mmHg  Pulse 71  Temp(Src) 98.6 F (37 C) (Oral)  Wt 190 lb (86.183 kg)  SpO2  98% Wt Readings from Last 3 Encounters:  08/19/15 190 lb (86.183 kg)  07/19/15 197 lb 4 oz (89.472 kg)  10/01/14 194 lb (87.998 kg)    General: Appears her stated age, obese in NAD. Cardiovascular: Normal rate and rhythm. S1,S2 noted.  No murmur, rubs or gallops noted.  Pulmonary/Chest: Normal effort and positive vesicular breath sounds. No respiratory distress. No wheezes, rales or ronchi noted.  Neurological: Alert and oriented.   BMET    Component Value Date/Time   NA 139 07/19/2015 1511   K 4.4 07/19/2015 1511   CL 104 07/19/2015 1511   CO2 24 07/19/2015 1511   GLUCOSE 85 07/19/2015 1511   BUN 13 07/19/2015 1511   CREATININE 0.79 07/19/2015 1511   CREATININE 0.7 09/07/2013 0909    CALCIUM 9.1 07/19/2015 1511    Lipid Panel     Component Value Date/Time   CHOL 176 07/19/2015 1511   TRIG 93 07/19/2015 1511   HDL 43* 07/19/2015 1511   CHOLHDL 4.1 07/19/2015 1511   VLDL 19 07/19/2015 1511   LDLCALC 114 07/19/2015 1511    CBC    Component Value Date/Time   WBC 6.0 07/19/2015 1511   RBC 4.35 07/19/2015 1511   HGB 10.1* 07/19/2015 1511   HCT 32.8* 07/19/2015 1511   PLT 339 07/19/2015 1511   MCV 75.4* 07/19/2015 1511   MCH 23.2* 07/19/2015 1511   MCHC 30.8* 07/19/2015 1511   RDW 16.3* 07/19/2015 1511    Hgb A1C Lab Results  Component Value Date   HGBA1C 5.7 09/07/2013        Assessment & Plan:   Abnormal weight gain:  Encouraged her to cut out OJ, cut out fast food smoothies, replace granola bars with protein bars Encouraged her to start exercising, walking 30 minutes at least 3 days per week RX for Phentermine provided today  RTC in 1 month for weight check/med refill Webb Silversmith, NP

## 2015-08-19 NOTE — Progress Notes (Signed)
Pre visit review using our clinic review tool, if applicable. No additional management support is needed unless otherwise documented below in the visit note. 

## 2015-08-23 LAB — HM MAMMOGRAPHY

## 2015-08-26 ENCOUNTER — Encounter: Payer: Self-pay | Admitting: Internal Medicine

## 2015-09-23 ENCOUNTER — Ambulatory Visit: Payer: BC Managed Care – PPO | Admitting: Internal Medicine

## 2015-09-23 DIAGNOSIS — Z0289 Encounter for other administrative examinations: Secondary | ICD-10-CM

## 2015-10-01 ENCOUNTER — Ambulatory Visit (INDEPENDENT_AMBULATORY_CARE_PROVIDER_SITE_OTHER): Payer: BC Managed Care – PPO | Admitting: Internal Medicine

## 2015-10-01 ENCOUNTER — Encounter: Payer: Self-pay | Admitting: Internal Medicine

## 2015-10-01 VITALS — BP 118/82 | HR 74 | Temp 98.7°F | Wt 190.0 lb

## 2015-10-01 DIAGNOSIS — Z01419 Encounter for gynecological examination (general) (routine) without abnormal findings: Secondary | ICD-10-CM

## 2015-10-01 DIAGNOSIS — R635 Abnormal weight gain: Secondary | ICD-10-CM

## 2015-10-01 DIAGNOSIS — Z124 Encounter for screening for malignant neoplasm of cervix: Secondary | ICD-10-CM | POA: Diagnosis not present

## 2015-10-01 MED ORDER — PHENTERMINE HCL 37.5 MG PO CAPS: 37.5000 mg | ORAL_CAPSULE | ORAL | 0 refills | Status: DC

## 2015-10-02 ENCOUNTER — Other Ambulatory Visit (HOSPITAL_COMMUNITY)
Admission: RE | Admit: 2015-10-02 | Discharge: 2015-10-02 | Disposition: A | Discharge status: Home / Self Care | Payer: BC Managed Care – PPO | Source: Ambulatory Visit | Attending: Internal Medicine | Admitting: Internal Medicine

## 2015-10-02 ENCOUNTER — Encounter: Payer: Self-pay | Admitting: Internal Medicine

## 2015-10-02 DIAGNOSIS — Z01419 Encounter for gynecological examination (general) (routine) without abnormal findings: Secondary | ICD-10-CM | POA: Insufficient documentation

## 2015-10-02 DIAGNOSIS — Z1151 Encounter for screening for human papillomavirus (HPV): Secondary | ICD-10-CM | POA: Insufficient documentation

## 2015-10-02 NOTE — Progress Notes (Signed)
Subjective:    Patient ID: Murray Hodgkins, female    DOB: March 25, 1975, 40 y.o.   MRN: [id number]  HPI  Pt presents to the clinic today for 1 month weight check/med refill. She was started on Phentermine 07/2015. Her starting weight was 197.25, BMI of 37.29. She has been taking the medication as directed and has not noticed any side effects. She did not lose or gain anything over the last month but she has noticed that her clothes are fitting better. Her weight today is 190 lbs with a BMI of 35.92. She has been eating more grilled and baked meats instead of fried. She has tried to increase more vegetables and salads. She is cutting out fast food. She is drinking 6-8 glasses of water daily, and cut back on soda. She has started walking as well.  She is also due for her pap smear today. Her last pap was > 3 years ago. She is still having regular periods. She denies vaginal complaints.  Review of Systems      Past Medical History:  Diagnosis Date  . Asthma   . Depression   . Thyroid disease     Current Outpatient Prescriptions  Medication Sig Dispense Refill  . ferrous sulfate 325 (65 FE) MG tablet Take 325 mg by mouth daily with breakfast.    . levothyroxine (SYNTHROID, LEVOTHROID) 100 MCG tablet TAKE 1 TABLET (100 MCG TOTAL) BY MOUTH DAILY BEFORE BREAKFAST. 30 tablet 11  . phentermine 37.5 MG capsule Take 1 capsule (37.5 mg total) by mouth every morning. 30 capsule 0  . PROAIR HFA 108 (90 Base) MCG/ACT inhaler INHALE 2 PUFFS INTO THE LUNGS EVERY 6 (SIX) HOURS AS NEEDED FOR WHEEZING OR SHORTNESS OF BREATH. 8.5 Inhaler 0   No current facility-administered medications for this visit.     Allergies  Allergen Reactions  . Augmentin [Amoxicillin-Pot Clavulanate] Other (See Comments)    Muscle cramps    Family History  Problem Relation Age of Onset  . Cancer Mother     Breast  . Arthritis Father   . Heart disease Father   . Stroke Father   . Hypertension Father   . Diabetes  Father   . Congestive Heart Failure Father   . Cancer Maternal Grandmother     Breast  . Hypertension Maternal Grandmother   . Hypertension Paternal Grandmother   . Learning disabilities Paternal Grandmother     Social History   Social History  . Marital status: Married    Spouse name: N/A  . Number of children: N/A  . Years of education: N/A   Occupational History  . Not on file.   Social History Main Topics  . Smoking status: Former Smoker    Types: Cigars  . Smokeless tobacco: Never Used  . Alcohol use 1.8 oz/week    3 Glasses of wine per week     Comment: occasional  . Drug use: No  . Sexual activity: Yes   Other Topics Concern  . Not on file   Social History Narrative  . No narrative on file     Constitutional: Denies fever, malaise, fatigue, headache or abrupt weight changes.  Respiratory: Denies difficulty breathing, shortness of breath, cough or sputum production.   Cardiovascular: Denies chest pain, chest tightness, palpitations or swelling in the hands or feet.  GU: Denies urgency, frequency, dysuria, blood in urine of vaginal discharge/odor. Neurological: Denies dizziness, difficulty with memory, difficulty with speech or problems with balance and coordination.  No other specific complaints in a complete review of systems (except as listed in HPI above).  Objective:   Physical Exam   BP 118/82   Pulse 74   Temp 98.7 F (37.1 C) (Oral)   Wt 190 lb (86.2 kg)   LMP 08/27/2015   BMI 35.90 kg/m  Wt Readings from Last 3 Encounters:  10/01/15 190 lb (86.2 kg)  08/19/15 190 lb (86.2 kg)  07/19/15 197 lb 4 oz (89.5 kg)    General: Appears her stated age, obese in NAD. Cardiovascular: Normal rate and rhythm. S1,S2 noted.  No murmur, rubs or gallops noted.  Pulmonary/Chest: Normal effort and positive vesicular breath sounds. No respiratory distress. No wheezes, rales or ronchi noted.  Abdomen: Soft, nontender. Active bowel sounds. Pelvic: Normal  female anatomy. No discharge noted in the vaginal vault. No CMT. Adnexa non palpable. Neurological: Alert and oriented.   BMET    Component Value Date/Time   NA 139 07/19/2015 1511   K 4.4 07/19/2015 1511   CL 104 07/19/2015 1511   CO2 24 07/19/2015 1511   GLUCOSE 85 07/19/2015 1511   BUN 13 07/19/2015 1511   CREATININE 0.79 07/19/2015 1511   CALCIUM 9.1 07/19/2015 1511    Lipid Panel     Component Value Date/Time   CHOL 176 07/19/2015 1511   TRIG 93 07/19/2015 1511   HDL 43 (L) 07/19/2015 1511   CHOLHDL 4.1 07/19/2015 1511   VLDL 19 07/19/2015 1511   LDLCALC 114 07/19/2015 1511    CBC    Component Value Date/Time   WBC 6.0 07/19/2015 1511   RBC 4.35 07/19/2015 1511   HGB 10.1 (L) 07/19/2015 1511   HCT 32.8 (L) 07/19/2015 1511   PLT 339 07/19/2015 1511   MCV 75.4 (L) 07/19/2015 1511   MCH 23.2 (L) 07/19/2015 1511   MCHC 30.8 (L) 07/19/2015 1511   RDW 16.3 (H) 07/19/2015 1511    Hgb A1C Lab Results  Component Value Date   HGBA1C 5.7 09/07/2013        Assessment & Plan:   Abnormal weight gain:  Encouraged her to continue to cut out fast food and drink more water Encouraged her to start exercising, walking 30 minutes at least 3 days per week RX for Phentermine provided today  RTC in 1 month for weight check/med refill  Screening for cervical cancer with [facility] exam:  Pap today She declines STD testing Will call you with the results  Webb Silversmith, NP

## 2015-10-02 NOTE — Patient Instructions (Signed)

## 2015-10-03 LAB — CYTOLOGY - PAP

## 2015-11-01 ENCOUNTER — Ambulatory Visit (INDEPENDENT_AMBULATORY_CARE_PROVIDER_SITE_OTHER): Payer: BC Managed Care – PPO | Admitting: Internal Medicine

## 2015-11-01 ENCOUNTER — Encounter: Payer: Self-pay | Admitting: Internal Medicine

## 2015-11-01 VITALS — BP 112/60 | HR 65 | Temp 98.7°F | Wt 186.0 lb

## 2015-11-01 DIAGNOSIS — R635 Abnormal weight gain: Secondary | ICD-10-CM | POA: Diagnosis not present

## 2015-11-01 MED ORDER — PHENTERMINE HCL 37.5 MG PO CAPS: 37.5000 mg | ORAL_CAPSULE | ORAL | 0 refills | Status: DC

## 2015-11-01 MED ORDER — TOPIRAMATE 25 MG PO CPSP: 25.0000 mg | ORAL_CAPSULE | Freq: Every day | ORAL | 0 refills | Status: DC

## 2015-11-01 NOTE — Progress Notes (Signed)
Pre visit review using our clinic review tool, if applicable. No additional management support is needed unless otherwise documented below in the visit note. 

## 2015-11-01 NOTE — Patient Instructions (Signed)

## 2015-11-01 NOTE — Progress Notes (Signed)
Subjective:    Patient ID: Carla Wheeler, female    DOB: Jul 29, 1975, 40 y.o.   MRN: [id number]  HPI  Pt presents to the clinic today for 1 month weight check/med refill. She was started on Phentermine 07/2015. Her starting weight was 197.25, BMI of 37.29. She has been taking the medication as directed and has not noticed any side effects. She lost 4 lbs over the last month. Her weight today is 186 lbs with a BMI of 35.14. She has been eating more grilled and baked meats instead of fried. She has tried to increase more vegetables and salads. She is cutting out fast food. She is drinking 6-8 glasses of water daily, and cut back on soda. She has started walking as well. She walks a mile 2 x week.  Review of Systems      Past Medical History:  Diagnosis Date  . Asthma   . Depression   . Thyroid disease     Current Outpatient Prescriptions  Medication Sig Dispense Refill  . ferrous sulfate 325 (65 FE) MG tablet Take 325 mg by mouth daily with breakfast.    . levothyroxine (SYNTHROID, LEVOTHROID) 100 MCG tablet TAKE 1 TABLET (100 MCG TOTAL) BY MOUTH DAILY BEFORE BREAKFAST. 30 tablet 11  . phentermine 37.5 MG capsule Take 1 capsule (37.5 mg total) by mouth every morning. 30 capsule 0  . PROAIR HFA 108 (90 Base) MCG/ACT inhaler INHALE 2 PUFFS INTO THE LUNGS EVERY 6 (SIX) HOURS AS NEEDED FOR WHEEZING OR SHORTNESS OF BREATH. 8.5 Inhaler 0   No current facility-administered medications for this visit.     Allergies  Allergen Reactions  . Augmentin [Amoxicillin-Pot Clavulanate] Other (See Comments)    Muscle cramps    Family History  Problem Relation Age of Onset  . Cancer Mother     Breast  . Arthritis Father   . Heart disease Father   . Stroke Father   . Hypertension Father   . Diabetes Father   . Congestive Heart Failure Father   . Cancer Maternal Grandmother     Breast  . Hypertension Maternal Grandmother   . Hypertension Paternal Grandmother   . Learning disabilities  Paternal Grandmother     Social History   Social History  . Marital status: Married    Spouse name: N/A  . Number of children: N/A  . Years of education: N/A   Occupational History  . Not on file.   Social History Main Topics  . Smoking status: Former Smoker    Types: Cigars  . Smokeless tobacco: Never Used  . Alcohol use 1.8 oz/week    3 Glasses of wine per week     Comment: occasional  . Drug use: No  . Sexual activity: Yes   Other Topics Concern  . Not on file   Social History Narrative  . No narrative on file     Constitutional: Denies fever, malaise, fatigue, headache or abrupt weight changes.  Respiratory: Denies difficulty breathing, shortness of breath, cough or sputum production.   Cardiovascular: Denies chest pain, chest tightness, palpitations or swelling in the hands or feet.  GU: Denies urgency, frequency, dysuria, blood in urine of vaginal discharge/odor. Neurological: Denies dizziness, difficulty with memory, difficulty with speech or problems with balance and coordination.    No other specific complaints in a complete review of systems (except as listed in HPI above).  Objective:   Physical Exam   BP 112/60 (BP Location: Left Arm, Patient Position:  Sitting)   Pulse 65   Temp 98.7 F (37.1 C) (Oral)   Wt 186 lb (84.4 kg)   LMP 10/04/2015 (Approximate)   SpO2 99%   BMI 35.14 kg/m   Wt Readings from Last 3 Encounters:  10/01/15 190 lb (86.2 kg)  08/19/15 190 lb (86.2 kg)  07/19/15 197 lb 4 oz (89.5 kg)    General: Appears her stated age, obese in NAD. Cardiovascular: Normal rate and rhythm. S1,S2 noted.  No murmur, rubs or gallops noted.  Pulmonary/Chest: Normal effort and positive vesicular breath sounds. No respiratory distress. No wheezes, rales or ronchi noted.  Abdomen: Soft, nontender. Active bowel sounds. Pelvic: Normal female anatomy. No discharge noted in the vaginal vault. No CMT. Adnexa non palpable. Neurological: Alert and  oriented.   BMET    Component Value Date/Time   NA 139 07/19/2015 1511   K 4.4 07/19/2015 1511   CL 104 07/19/2015 1511   CO2 24 07/19/2015 1511   GLUCOSE 85 07/19/2015 1511   BUN 13 07/19/2015 1511   CREATININE 0.79 07/19/2015 1511   CALCIUM 9.1 07/19/2015 1511    Lipid Panel     Component Value Date/Time   CHOL 176 07/19/2015 1511   TRIG 93 07/19/2015 1511   HDL 43 (L) 07/19/2015 1511   CHOLHDL 4.1 07/19/2015 1511   VLDL 19 07/19/2015 1511   LDLCALC 114 07/19/2015 1511    CBC    Component Value Date/Time   WBC 6.0 07/19/2015 1511   RBC 4.35 07/19/2015 1511   HGB 10.1 (L) 07/19/2015 1511   HCT 32.8 (L) 07/19/2015 1511   PLT 339 07/19/2015 1511   MCV 75.4 (L) 07/19/2015 1511   MCH 23.2 (L) 07/19/2015 1511   MCHC 30.8 (L) 07/19/2015 1511   RDW 16.3 (H) 07/19/2015 1511    Hgb A1C Lab Results  Component Value Date   HGBA1C 5.7 09/07/2013        Assessment & Plan:   Abnormal weight gain:  Encouraged her to increase her heart rate while exercising to burn more fat eRx for Topamax 25 mg daily RX for Phentermine provided today  RTC in 1 month for weight check/med refill   Webb Silversmith, NP

## 2015-11-27 ENCOUNTER — Other Ambulatory Visit: Payer: Self-pay | Admitting: Internal Medicine

## 2015-12-02 ENCOUNTER — Ambulatory Visit: Payer: BC Managed Care – PPO | Admitting: Internal Medicine

## 2015-12-02 DIAGNOSIS — Z0289 Encounter for other administrative examinations: Secondary | ICD-10-CM

## 2016-02-19 ENCOUNTER — Other Ambulatory Visit: Payer: Self-pay | Admitting: Internal Medicine

## 2016-02-19 MED ORDER — LEVOTHYROXINE SODIUM 125 MCG PO TABS: 125.0000 ug | ORAL_TABLET | Freq: Every day | ORAL | 1 refills | Status: DC

## 2016-05-25 ENCOUNTER — Ambulatory Visit (INDEPENDENT_AMBULATORY_CARE_PROVIDER_SITE_OTHER): Payer: BC Managed Care – PPO | Admitting: Internal Medicine

## 2016-05-25 ENCOUNTER — Encounter: Payer: Self-pay | Admitting: Internal Medicine

## 2016-05-25 VITALS — BP 126/82 | HR 93 | Temp 99.8°F | Wt 195.5 lb

## 2016-05-25 DIAGNOSIS — J209 Acute bronchitis, unspecified: Secondary | ICD-10-CM

## 2016-05-25 MED ORDER — ALBUTEROL SULFATE HFA 108 (90 BASE) MCG/ACT IN AERS: INHALATION_SPRAY | RESPIRATORY_TRACT | 1 refills | Status: DC

## 2016-05-25 NOTE — Progress Notes (Signed)
   Subjective:    Patient ID: Carla Wheeler, female    DOB: Dec 01, 1975, 41 y.o.   MRN: [id number]  HPI Here due to respiratory illness  Day 3 of cough with lots of mucus--feels like it is from chest Especially bad with breathing when lying down  Horrible headache Some sore throat Pressure along side of head--but no set ear pain. Relates to cough Fever---max 100.8 Some rhinorrhea Has knot in front of left ear also  Out of the proair Never used prednisone  Alka seltzer cold plus, ibuprofen---some help  Current Outpatient Prescriptions on File Prior to Visit  Medication Sig Dispense Refill  . levothyroxine (SYNTHROID, LEVOTHROID) 125 MCG tablet Take 1 tablet (125 mcg total) by mouth daily. 30 tablet 1  . PROAIR HFA 108 (90 Base) MCG/ACT inhaler INHALE 2 PUFFS INTO THE LUNGS EVERY 6 (SIX) HOURS AS NEEDED FOR WHEEZING OR SHORTNESS OF BREATH. 8.5 Inhaler 0  . ferrous sulfate 325 (65 FE) MG tablet Take 325 mg by mouth daily with breakfast.     No current facility-administered medications on file prior to visit.     Allergies  Allergen Reactions  . Augmentin [Amoxicillin-Pot Clavulanate] Other (See Comments)    Muscle cramps    Past Medical History:  Diagnosis Date  . Asthma   . Depression   . Thyroid disease     Past Surgical History:  Procedure Laterality Date  . CESAREAN SECTION      Family History  Problem Relation Age of Onset  . Cancer Mother     Breast  . Arthritis Father   . Heart disease Father   . Stroke Father   . Hypertension Father   . Diabetes Father   . Congestive Heart Failure Father   . Cancer Maternal Grandmother     Breast  . Hypertension Maternal Grandmother   . Hypertension Paternal Grandmother   . Learning disabilities Paternal Grandmother     Social History   Social History  . Marital status: Married    Spouse name: N/A  . Number of children: N/A  . Years of education: N/A   Occupational History  . Not on file.   Social  History Main Topics  . Smoking status: Former Smoker    Types: Cigars  . Smokeless tobacco: Never Used  . Alcohol use 1.8 oz/week    3 Glasses of wine per week     Comment: occasional  . Drug use: No  . Sexual activity: Yes   Other Topics Concern  . Not on file   Social History Narrative  . No narrative on file   Review of Systems  No rash No vomiting or diarrhea Appetite is okay     Objective:   Physical Exam  Constitutional: She appears well-nourished. No distress.  HENT:  No sinus tenderness TMs normal Mild nasal inflammation Slight pharyngeal injection  Neck: No thyromegaly present.  ??small left preauricular node  Pulmonary/Chest: Effort normal and breath sounds normal. No respiratory distress. She has no wheezes. She has no rales.  Lymphadenopathy:    She has no cervical adenopathy.          Assessment & Plan:

## 2016-05-25 NOTE — Patient Instructions (Signed)
Please send a message later this week if you are worsening.

## 2016-05-25 NOTE — Progress Notes (Signed)
Pre visit review using our clinic review tool, if applicable. No additional management support is needed unless otherwise documented below in the visit note. 

## 2016-05-25 NOTE — Assessment & Plan Note (Signed)
Discussed usual viral etiology Supportive care--especially analgesics Refill albuterol If worsens later this week, will give z-pak

## 2016-05-31 ENCOUNTER — Encounter: Payer: Self-pay | Admitting: Internal Medicine

## 2016-06-01 MED ORDER — AZITHROMYCIN 250 MG PO TABS: ORAL_TABLET | ORAL | 0 refills | Status: DC

## 2016-06-06 ENCOUNTER — Other Ambulatory Visit: Payer: Self-pay | Admitting: Internal Medicine

## 2016-06-12 ENCOUNTER — Other Ambulatory Visit: Payer: Self-pay | Admitting: Family Medicine

## 2016-06-12 ENCOUNTER — Ambulatory Visit
Admission: RE | Admit: 2016-06-12 | Discharge: 2016-06-12 | Disposition: A | Discharge status: Home / Self Care | Payer: Self-pay | Source: Ambulatory Visit | Attending: Family Medicine | Admitting: Family Medicine

## 2016-06-12 DIAGNOSIS — T1490XA Injury, unspecified, initial encounter: Secondary | ICD-10-CM

## 2016-07-23 ENCOUNTER — Telehealth: Payer: Worker's Compensation | Admitting: Family

## 2016-07-23 DIAGNOSIS — B9689 Other specified bacterial agents as the cause of diseases classified elsewhere: Secondary | ICD-10-CM

## 2016-07-23 DIAGNOSIS — N760 Acute vaginitis: Secondary | ICD-10-CM

## 2016-07-23 MED ORDER — METRONIDAZOLE 500 MG PO TABS: 500.0000 mg | ORAL_TABLET | Freq: Two times a day (BID) | ORAL | 0 refills | Status: DC

## 2016-07-23 NOTE — Progress Notes (Signed)
Thank you for the details you put in the comment boxes. Those details really help [place] take better care of you.   We are sorry that you are not feeling well. Here is how we plan to help! Based on what you shared with me it looks like you: May have a vaginosis due to bacteria  Vaginosis is an inflammation of the vagina that can result in discharge, itching and pain. The cause is usually a change in the normal balance of vaginal bacteria or an infection. Vaginosis can also result from reduced estrogen levels after menopause.  The most common causes of vaginosis are:   Bacterial vaginosis which results from an overgrowth of one on several organisms that are normally present in your vagina.   Yeast infections which are caused by a naturally occurring fungus called candida.   Vaginal atrophy (atrophic vaginosis) which results from the thinning of the vagina from reduced estrogen levels after menopause.   Trichomoniasis which is caused by a parasite and is commonly transmitted by sexual intercourse.  Factors that increase your risk of developing vaginosis include: Marland Kitchen Medications, such as antibiotics and steroids . Uncontrolled diabetes . Use of hygiene products such as bubble bath, vaginal spray or vaginal deodorant . Douching . Wearing damp or tight-fitting clothing . Using an intrauterine device (IUD) for birth control . Hormonal changes, such as those associated with pregnancy, birth control pills or menopause . Sexual activity . Having a sexually transmitted infection  Your treatment plan is Metronidazole or Flagyl 500mg  twice a day for 7 days.  I have electronically sent this prescription into the pharmacy that you have chosen.  Be sure to take all of the medication as directed. Stop taking any medication if you develop a rash, tongue swelling or shortness of breath. Mothers who are breast feeding should consider pumping and discarding their breast milk while on these antibiotics. However,  there is no consensus that infant exposure at these doses would be harmful.  Remember that medication creams can weaken latex condoms. Marland Kitchen   HOME CARE:  Good hygiene may prevent some types of vaginosis from recurring and may relieve some symptoms:  . Avoid baths, hot tubs and whirlpool spas. Rinse soap from your outer genital area after a shower, and dry the area well to prevent irritation. Don't use scented or harsh soaps, such as those with deodorant or antibacterial action. Marland Kitchen Avoid irritants. These include scented tampons and pads. . Wipe from front to back after using the toilet. Doing so avoids spreading fecal bacteria to your vagina.  Other things that may help prevent vaginosis include:  Marland Kitchen Don't douche. Your vagina doesn't require cleansing other than normal bathing. Repetitive douching disrupts the normal organisms that reside in the vagina and can actually increase your risk of vaginal infection. Douching won't clear up a vaginal infection. . Use a latex condom. Both female and female latex condoms may help you avoid infections spread by sexual contact. . Wear cotton underwear. Also wear pantyhose with a cotton crotch. If you feel comfortable without it, skip wearing underwear to bed. Yeast thrives in [employer] Your symptoms should improve in the next day or two.  GET HELP RIGHT AWAY IF:  . You have pain in your lower abdomen ( pelvic area or over your ovaries) . You develop nausea or vomiting . You develop a fever . Your discharge changes or worsens . You have persistent pain with intercourse . You develop shortness of breath, a rapid pulse, or  you faint.  These symptoms could be signs of problems or infections that need to be evaluated by a medical provider now.  MAKE SURE YOU    Understand these instructions.  Will watch your condition.  Will get help right away if you are not doing well or get worse.  Your e-visit answers were reviewed by a board certified  advanced clinical practitioner to complete your personal care plan. Depending upon the condition, your plan could have included both over the counter or prescription medications. Please review your pharmacy choice to make sure that you have choses a pharmacy that is open for you to pick up any needed prescription, Your safety is important to [place]. If you have drug allergies check your prescription carefully.   You can use MyChart to ask questions about today's visit, request a non-urgent call back, or ask for a work or school excuse for 24 hours related to this e-Visit. If it has been greater than 24 hours you will need to follow up with your provider, or enter a new e-Visit to address those concerns. You will get a MyChart message within the next two days asking about your experience. I hope that your e-visit has been valuable and will speed your recovery.

## 2016-07-24 ENCOUNTER — Ambulatory Visit: Payer: Self-pay | Admitting: Primary Care

## 2016-08-28 ENCOUNTER — Encounter: Payer: Self-pay | Admitting: Primary Care

## 2016-08-28 ENCOUNTER — Ambulatory Visit (INDEPENDENT_AMBULATORY_CARE_PROVIDER_SITE_OTHER): Payer: BC Managed Care – PPO | Admitting: Primary Care

## 2016-08-28 ENCOUNTER — Telehealth: Payer: Self-pay | Admitting: Internal Medicine

## 2016-08-28 VITALS — BP 124/86 | HR 60 | Temp 98.2°F | Wt 199.4 lb

## 2016-08-28 DIAGNOSIS — R350 Frequency of micturition: Secondary | ICD-10-CM | POA: Diagnosis not present

## 2016-08-28 DIAGNOSIS — N898 Other specified noninflammatory disorders of vagina: Secondary | ICD-10-CM

## 2016-08-28 LAB — POC URINALSYSI DIPSTICK (AUTOMATED)
BILIRUBIN UA: NEGATIVE
Blood, UA: NEGATIVE
Glucose, UA: NEGATIVE
LEUKOCYTES UA: NEGATIVE
NITRITE UA: NEGATIVE
Protein, UA: NEGATIVE
Spec Grav, UA: 1.025 (ref 1.010–1.025)
Urobilinogen, UA: 0.2 E.U./dL
pH, UA: 6 (ref 5.0–8.0)

## 2016-08-28 NOTE — Patient Instructions (Signed)
We will send off your vaginal specimen and notify you of the results once received. Check your My Chart messages tomorrow.   Your urine does not show evidence of infection which is reassuring.  It was a pleasure meeting you!

## 2016-08-28 NOTE — Progress Notes (Signed)
Subjective:    Patient ID: Carla Wheeler, female    DOB: September 02, 1975, 41 y.o.   MRN: [id number]  HPI  Carla Wheeler is a 41 year old female who presents today with a chief complaint of vaginal discharge. She also reports vaginal itching. Her symptoms began about 1 month ago.   She completed an e-visit for urinary frequency, difficulty urinating, and clear vaginal discharge several weeks ago. She was prescribed oral flagyl and symptoms resolved. One week ago she noticed whitish vaginal discharge with her itching, urinary frequency, dysuria.   She denies fevers, pelvic pain, hematuria.   Review of Systems  Constitutional: Negative for fever.  Gastrointestinal: Negative for abdominal pain and nausea.  Genitourinary: Positive for dysuria, frequency, urgency and vaginal discharge. Negative for flank pain and hematuria.       Past Medical History:  Diagnosis Date  . Asthma   . Depression   . Thyroid disease      Social History   Social History  . Marital status: Married    Spouse name: N/A  . Number of children: N/A  . Years of education: N/A   Occupational History  . Not on file.   Social History Main Topics  . Smoking status: Former Smoker    Types: Cigars  . Smokeless tobacco: Never Used  . Alcohol use 1.8 oz/week    3 Glasses of wine per week     Comment: occasional  . Drug use: No  . Sexual activity: Yes   Other Topics Concern  . Not on file   Social History Narrative  . No narrative on file    Past Surgical History:  Procedure Laterality Date  . CESAREAN SECTION      Family History  Problem Relation Age of Onset  . Cancer Mother        Breast  . Arthritis Father   . Heart disease Father   . Stroke Father   . Hypertension Father   . Diabetes Father   . Congestive Heart Failure Father   . Cancer Maternal Grandmother        Breast  . Hypertension Maternal Grandmother   . Hypertension Paternal Grandmother   . Learning disabilities Paternal  Grandmother     Allergies  Allergen Reactions  . Augmentin [Amoxicillin-Pot Clavulanate] Other (See Comments)    Muscle cramps    Current Outpatient Prescriptions on File Prior to Visit  Medication Sig Dispense Refill  . albuterol (PROAIR HFA) 108 (90 Base) MCG/ACT inhaler INHALE 2 PUFFS INTO THE LUNGS EVERY 6 (SIX) HOURS AS NEEDED FOR WHEEZING OR SHORTNESS OF BREATH. 8.5 Inhaler 1  . ferrous sulfate 325 (65 FE) MG tablet Take 325 mg by mouth daily with breakfast.    . levothyroxine (SYNTHROID, LEVOTHROID) 125 MCG tablet Take 1 tablet (125 mcg total) by mouth daily. PLEASE SCHEDULE ANNUAL EXAM FOR June 2018 30 tablet 1   No current facility-administered medications on file prior to visit.     BP 124/86   Pulse 60   Temp 98.2 F (36.8 C) (Oral)   Wt 199 lb 6.4 oz (90.4 kg)   LMP 08/08/2016   SpO2 98%   BMI 37.68 kg/m    Objective:   Physical Exam  Constitutional: She appears well-nourished.  Neck: Neck supple.  Cardiovascular: Normal rate.   Pulmonary/Chest: Effort normal.  Abdominal: Soft. There is no tenderness.  Genitourinary: There is no lesion on the right labia. There is no lesion on the left labia. Cervix  exhibits no motion tenderness and no discharge. No vaginal discharge found.  Skin: Skin is warm and dry.          Assessment & Plan:  Vaginal Discharge/Urinary Frequency:  Also with vaginal itching and urinary symptoms. Vaginal exam today unremarkable. Wet prep, gonorrhea/chlamydia sent off for testing. UA today: Negative. Will await lab results.  Sheral Flow, NP

## 2016-08-28 NOTE — Telephone Encounter (Signed)
She has not been on Phentermine since 10/2015. If she wants to restart, this would require an OV.

## 2016-08-28 NOTE — Addendum Note (Signed)
Addended by: Jacqualin Combes on: 08/28/2016 11:11 AM   Modules accepted: Orders

## 2016-08-28 NOTE — Telephone Encounter (Signed)
Patient was seen by Carla Wheeler for an acute on 08/28/16. Patient stated that she would like a refill of her phentermine 37.5  Ok to refill? It stated that therapy completed on 05/25/2016

## 2016-08-29 LAB — GC/CHLAMYDIA PROBE AMP
CT Probe RNA: NOT DETECTED
GC Probe RNA: NOT DETECTED

## 2016-08-29 LAB — WET PREP BY MOLECULAR PROBE
CANDIDA SPECIES: NOT DETECTED
GARDNERELLA VAGINALIS: NOT DETECTED
Trichomonas vaginosis: NOT DETECTED

## 2016-08-31 ENCOUNTER — Telehealth: Payer: Self-pay | Admitting: *Deleted

## 2016-08-31 NOTE — Telephone Encounter (Signed)
Pt is aware of results and will schedule appt with GYN

## 2016-08-31 NOTE — Telephone Encounter (Signed)
Pt is aware of needing OV and will call back to schedule appt

## 2016-08-31 NOTE — Telephone Encounter (Signed)
[facility] Patient Name: Carla Wheeler Gender: Female DOB: 02-02-1976 Age: 41 Y 42 M 24 D Return Phone Number: [phone] (Primary) [place] [address] Client [facility] - Client Client Site [place] Physician Alma Friendly - NP Who Is Calling Patient / Member / Family / Caregiver Call Type Triage / Clinical Relationship To Patient Self Return Phone Number [phone] (Primary) Chief Complaint Lab Result Reason for Call Symptomatic / Request for Health Information Initial Comment Caller askingto get test results because she is supposed to get a rx. She says dr Renold Don contacted her Nurse Assessment Nurse: Arthor Captain, RN, Joycelyn Schmid Date/Time (Eastern Time): 08/29/2016 1:28:09 PM Confirm and document reason for call. If symptomatic, describe symptoms. ---Caller states that she is having discharge and itching in the vaginal area. Denies fever. Symptoms started two weeks ago. Does the PT have any chronic conditions? (i.e. diabetes, asthma, etc.) ---Yes List chronic conditions. ---hyperthyroid Is the patient pregnant or possibly pregnant? (Ask all females between the ages of 65-55) ---No Guidelines Guideline Title Affirmed Question Vaginal Discharge Symptoms of a vaginal yeast infection (i.e., white, thick, cottage-cheese-like, itchy, not bad smelling discharge) Disp. Time [name] Time) Disposition Final User 08/29/2016 1:34:00 [place], RN, [facility] Advice Given Per Guideline HOME CARE: You should be able to treat this at home. REASSURANCE: Your symptoms are probably caused by a yeast infection of the vagina. Over the counter (OTC) medications usually can clear up these symptoms. OTC ANTIFUNGAL MEDS FOR VAGINAL YEAST INFECTION: * [place] Examples include miconazole (Monistat),  clotrimazole (Gyne-Lotrimin, Mycelex), butoconazole (Femstat) * Read and follow the package instructions closely. EXPECTED COURSE: * If no improvement within 3 days, you will need to be examined by a physician. If so, make an appointment with your PCP. * Do not use the yeast medication during the 24 hours prior to your appointment (Reason: interferes with examination). GENITAL HYGIENE: * Keep your genital area clean. Wash daily. * Keep your genital area dry. Wear cotton underwear or underwear with a cotton crotch. * Do not douche * Do not use feminine hygiene products * Avoid prolonged wearing of pantyhose, tight jeans * Avoid bubble baths, deodorant tampons CALL BACK IF: * Discharge becomes yellow or green * Discharge becomes foul smelling or itchy * Fever or abdominal pain occur * You become worse. CARE ADVICE given per Vaginal Discharge (Adult) guideline. PLEASE NOTE: All timestamps contained within this report are represented as [place] Standard Time. CONFIDENTIALTY NOTICE: This fax transmission is intended only for the addressee. It contains information that is legally privileged, confidential or otherwise protected from use or disclosure. If you are not the intended recipient, you are strictly prohibited from reviewing, disclosing, copying using or disseminating any of this information or taking any action in reliance on or regarding this information. If you have received this fax in error, please notify us immediately by telephone so that we can arrange for its return to [place]. Phone: [phone], Toll-Free: [phone], Fax: [phone] Page: 2 of 2 Call Id:

## 2016-08-31 NOTE — Telephone Encounter (Signed)
Did she make an appt?

## 2016-09-14 ENCOUNTER — Other Ambulatory Visit: Payer: Self-pay | Admitting: Internal Medicine

## 2016-11-18 ENCOUNTER — Other Ambulatory Visit: Payer: Self-pay | Admitting: Internal Medicine

## 2016-11-18 NOTE — Telephone Encounter (Signed)
Last Rx states CPE required for any additional refills. Last labs 07/2015

## 2016-11-30 ENCOUNTER — Emergency Department (HOSPITAL_BASED_OUTPATIENT_CLINIC_OR_DEPARTMENT_OTHER)
Admission: EM | Admit: 2016-11-30 | Discharge: 2016-11-30 | Disposition: A | Discharge status: Home / Self Care | Payer: Worker's Compensation | Attending: Emergency Medicine | Admitting: Emergency Medicine

## 2016-11-30 ENCOUNTER — Emergency Department (HOSPITAL_BASED_OUTPATIENT_CLINIC_OR_DEPARTMENT_OTHER): Payer: Worker's Compensation

## 2016-11-30 ENCOUNTER — Encounter (HOSPITAL_BASED_OUTPATIENT_CLINIC_OR_DEPARTMENT_OTHER): Payer: Self-pay | Admitting: Emergency Medicine

## 2016-11-30 DIAGNOSIS — J45909 Unspecified asthma, uncomplicated: Secondary | ICD-10-CM | POA: Diagnosis not present

## 2016-11-30 DIAGNOSIS — Y92219 Unspecified school as the place of occurrence of the external cause: Secondary | ICD-10-CM | POA: Insufficient documentation

## 2016-11-30 DIAGNOSIS — Z87891 Personal history of nicotine dependence: Secondary | ICD-10-CM | POA: Insufficient documentation

## 2016-11-30 DIAGNOSIS — Z79899 Other long term (current) drug therapy: Secondary | ICD-10-CM | POA: Diagnosis not present

## 2016-11-30 DIAGNOSIS — W503XXA Accidental bite by another person, initial encounter: Secondary | ICD-10-CM | POA: Insufficient documentation

## 2016-11-30 DIAGNOSIS — Y9389 Activity, other specified: Secondary | ICD-10-CM | POA: Insufficient documentation

## 2016-11-30 DIAGNOSIS — S60947A Unspecified superficial injury of left little finger, initial encounter: Secondary | ICD-10-CM | POA: Diagnosis present

## 2016-11-30 DIAGNOSIS — E079 Disorder of thyroid, unspecified: Secondary | ICD-10-CM | POA: Diagnosis not present

## 2016-11-30 DIAGNOSIS — S60477A Other superficial bite of left little finger, initial encounter: Secondary | ICD-10-CM | POA: Diagnosis not present

## 2016-11-30 DIAGNOSIS — Y990 Civilian activity done for income or pay: Secondary | ICD-10-CM | POA: Diagnosis not present

## 2016-11-30 MED ORDER — IBUPROFEN 600 MG PO TABS: 600.0000 mg | ORAL_TABLET | Freq: Four times a day (QID) | ORAL | 0 refills | Status: DC | PRN

## 2016-11-30 MED ORDER — TETANUS-DIPHTH-ACELL PERTUSSIS 5-2.5-18.5 LF-MCG/0.5 IM SUSP
0.5000 mL | Freq: Once | INTRAMUSCULAR | Status: AC
  Administered 2016-11-30: 0.5 mL via INTRAMUSCULAR
  Filled 2016-11-30: qty 0.5

## 2016-11-30 MED ORDER — DOXYCYCLINE HYCLATE 100 MG PO CAPS: 100.0000 mg | ORAL_CAPSULE | Freq: Two times a day (BID) | ORAL | 0 refills | Status: DC

## 2016-11-30 MED ORDER — IBUPROFEN 200 MG PO TABS
600.0000 mg | ORAL_TABLET | Freq: Once | ORAL | Status: AC
  Administered 2016-11-30: 600 mg via ORAL
  Filled 2016-11-30: qty 1

## 2016-11-30 MED FILL — IBUPROFEN 600 MG TABLET: 600 | 7 days supply | Qty: 30 | Fill #0

## 2016-11-30 MED FILL — DOXYCYCLINE HYCLATE 100 MG: 100 | 10 days supply | Qty: 20 | Fill #0
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
## 2016-12-01 ENCOUNTER — Ambulatory Visit: Payer: Self-pay | Admitting: Internal Medicine

## 2017-05-19 ENCOUNTER — Ambulatory Visit: Payer: BC Managed Care – PPO | Admitting: Internal Medicine

## 2017-05-19 ENCOUNTER — Encounter: Payer: Self-pay | Admitting: Internal Medicine

## 2017-05-19 VITALS — BP 130/84 | HR 75 | Temp 98.6°F | Wt 204.0 lb

## 2017-05-19 DIAGNOSIS — N920 Excessive and frequent menstruation with regular cycle: Secondary | ICD-10-CM

## 2017-05-19 NOTE — Patient Instructions (Signed)
Menorrhagia Menorrhagia is when your menstrual periods are heavy or last longer than usual. Follow these instructions at home:  Only take medicine as told by your doctor.  Take any iron pills as told by your doctor. Heavy bleeding may cause low levels of iron in your body.  Do not take aspirin 1 week before or during your period. Aspirin can make the bleeding worse.  Lie down for a while if you change your tampon or pad more than once in 2 hours. This may help lessen the bleeding.  Eat a healthy diet and foods with iron. These foods include leafy green vegetables, meat, liver, eggs, and whole grain breads and cereals.  Do not try to lose weight. Wait until the heavy bleeding has stopped and your iron level is normal. Contact a doctor if:  You soak through a pad or tampon every 1 or 2 hours, and this happens every time you have a period.  You need to use pads and tampons at the same time because you are bleeding so much.  You need to change your pad or tampon during the night.  You have a period that lasts for more than 8 days.  You pass clots bigger than 1 inch (2.5 cm) wide.  You have irregular periods that happen more or less often than once a month.  You feel dizzy or pass out (faint).  You feel very weak or tired.  You feel short of breath or feel your heart is beating too fast when you exercise.  You feel sick to your stomach (nausea) and you throw up (vomit) while you are taking your medicine.  You have watery poop (diarrhea) while you are taking your medicine.  You have any problems that may be related to the medicine you are taking. Get help right away if:  You soak through 4 or more pads or tampons in 2 hours.  You have any bleeding while you are pregnant. This information is not intended to replace advice given to you by your health care provider. Make sure you discuss any questions you have with your health care provider. Document Released: 11/05/2007 Document  Revised: 07/04/2015 Document Reviewed: 07/28/2012 Elsevier Interactive Patient Education  2017 [employer]  

## 2017-05-24 ENCOUNTER — Encounter: Payer: Self-pay | Admitting: Internal Medicine

## 2017-05-24 NOTE — Progress Notes (Signed)
Subjective:    Patient ID: Carla Wheeler, female    DOB: [birth year], 42 y.o.   MRN: [id number]  HPI  Pt presents to the clinic today with c/o heavy menstrual bleeding. She reports this has always been an issue but worse in the last 2 months. She has had an increase in cramping and is passing clots. She reports she is bleeding through tampons and pads. She has also noticed an increase in vaginal discharge between periods. The discharge is thin and clear, but has a slight odor. She is sexually active. She has not tried anything OTC for her symptoms.   Review of Systems   Past Medical History:  Diagnosis Date  . Asthma   . Depression   . Thyroid disease     Current Outpatient Medications  Medication Sig Dispense Refill  . albuterol (PROAIR HFA) 108 (90 Base) MCG/ACT inhaler INHALE 2 PUFFS INTO THE LUNGS EVERY 6 (SIX) HOURS AS NEEDED FOR WHEEZING OR SHORTNESS OF BREATH. 8.5 Inhaler 1  . ferrous sulfate 325 (65 FE) MG tablet Take 325 mg by mouth daily with breakfast.    . levothyroxine (SYNTHROID, LEVOTHROID) 125 MCG tablet Take 1 tablet (125 mcg total) by mouth daily. NO MORE REFILLS WITHOUT ANNUAL PHYSICAL 30 tablet 1   No current facility-administered medications for this visit.     Allergies  Allergen Reactions  . Augmentin [Amoxicillin-Pot Clavulanate] Other (See Comments)    Muscle cramps    Family History  Problem Relation Age of Onset  . Cancer Mother        Breast  . Arthritis Father   . Heart disease Father   . Stroke Father   . Hypertension Father   . Diabetes Father   . Congestive Heart Failure Father   . Cancer Maternal Grandmother        Breast  . Hypertension Maternal Grandmother   . Hypertension Paternal Grandmother   . Learning disabilities Paternal Grandmother     Social History   Socioeconomic History  . Marital status: Married    Spouse name: Not on file  . Number of children: Not on file  . Years of education: Not on file  . Highest  education level: Not on file  Occupational History  . Not on file  Social Needs  . Financial resource strain: Not on file  . Food insecurity:    Worry: Not on file    Inability: Not on file  . Transportation needs:    Medical: Not on file    Non-medical: Not on file  Tobacco Use  . Smoking status: Former Smoker    Types: Cigars  . Smokeless tobacco: Never Used  Substance and Sexual Activity  . Alcohol use: Yes    Alcohol/week: 1.8 oz    Types: 3 Glasses of wine per week    Comment: occasional  . Drug use: No  . Sexual activity: Yes  Lifestyle  . Physical activity:    Days per week: Not on file    Minutes per session: Not on file  . Stress: Not on file  Relationships  . Social connections:    Talks on phone: Not on file    Gets together: Not on file    Attends religious service: Not on file    Active member of club or organization: Not on file    Attends meetings of clubs or organizations: Not on file    Relationship status: Not on file  . Intimate partner violence:  Fear of current or ex partner: Not on file    Emotionally abused: Not on file    Physically abused: Not on file    Forced sexual activity: Not on file  Other Topics Concern  . Not on file  Social History Narrative  . Not on file     Constitutional: Denies fever, malaise, fatigue, headache or abrupt weight changes.  Gastrointestinal: Pt reports menstrual cramping. Denies abdominal pain, bloating, constipation, diarrhea or blood in the stool.  GU: Pt reports vaginal discharge and heavy menses. Denies urgency, frequency, pain with urination, burning sensation, blood in urine.   No other specific complaints in a complete review of systems (except as listed in HPI above).    Objective:   Physical Exam  BP 130/84   Pulse 75   Temp 98.6 F (37 C) (Oral)   Wt 204 lb (92.5 kg)   LMP 04/23/2017   SpO2 99%   BMI 39.84 kg/m  Wt Readings from Last 3 Encounters:  05/19/17 204 lb (92.5 kg)    11/30/16 180 lb (81.6 kg)  08/28/16 199 lb 6.4 oz (90.4 kg)    General: Appears her stated age, obese in NAD. Abdomen: Soft and nontender. Normal bowel sounds. No distention or masses noted.  Pelvic: Deferred, on menses.  BMET    Component Value Date/Time   NA 139 07/19/2015 1511   K 4.4 07/19/2015 1511   CL 104 07/19/2015 1511   CO2 24 07/19/2015 1511   GLUCOSE 85 07/19/2015 1511   BUN 13 07/19/2015 1511   CREATININE 0.79 07/19/2015 1511   CALCIUM 9.1 07/19/2015 1511    Lipid Panel     Component Value Date/Time   CHOL 176 07/19/2015 1511   TRIG 93 07/19/2015 1511   HDL 43 (L) 07/19/2015 1511   CHOLHDL 4.1 07/19/2015 1511   VLDL 19 07/19/2015 1511   LDLCALC 114 07/19/2015 1511    CBC    Component Value Date/Time   WBC 6.0 07/19/2015 1511   RBC 4.35 07/19/2015 1511   HGB 10.1 (L) 07/19/2015 1511   HCT 32.8 (L) 07/19/2015 1511   PLT 339 07/19/2015 1511   MCV 75.4 (L) 07/19/2015 1511   MCH 23.2 (L) 07/19/2015 1511   MCHC 30.8 (L) 07/19/2015 1511   RDW 16.3 (H) 07/19/2015 1511    Hgb A1C Lab Results  Component Value Date   HGBA1C 5.7 09/07/2013            Assessment & Plan:   Menorrhagia:  Will obtain pelvic/tranvaginal ultrasound Discussed different measures of treatment for this  Return precautions discused Webb Silversmith, NP

## 2017-05-25 ENCOUNTER — Ambulatory Visit (HOSPITAL_COMMUNITY): Admission: RE | Admit: 2017-05-25 | Payer: BC Managed Care – PPO | Source: Ambulatory Visit

## 2017-06-10 ENCOUNTER — Encounter (HOSPITAL_COMMUNITY): Payer: Self-pay | Admitting: Emergency Medicine

## 2017-06-10 ENCOUNTER — Emergency Department (HOSPITAL_COMMUNITY): Payer: BC Managed Care – PPO

## 2017-06-10 ENCOUNTER — Other Ambulatory Visit: Payer: Self-pay

## 2017-06-10 ENCOUNTER — Emergency Department (HOSPITAL_COMMUNITY)
Admission: EM | Admit: 2017-06-10 | Discharge: 2017-06-10 | Disposition: A | Discharge status: Home / Self Care | Payer: BC Managed Care – PPO | Attending: Emergency Medicine | Admitting: Emergency Medicine

## 2017-06-10 DIAGNOSIS — S199XXA Unspecified injury of neck, initial encounter: Secondary | ICD-10-CM | POA: Diagnosis present

## 2017-06-10 DIAGNOSIS — Y92410 Unspecified street and highway as the place of occurrence of the external cause: Secondary | ICD-10-CM | POA: Insufficient documentation

## 2017-06-10 DIAGNOSIS — Y939 Activity, unspecified: Secondary | ICD-10-CM | POA: Insufficient documentation

## 2017-06-10 DIAGNOSIS — Z79899 Other long term (current) drug therapy: Secondary | ICD-10-CM | POA: Insufficient documentation

## 2017-06-10 DIAGNOSIS — R51 Headache: Secondary | ICD-10-CM | POA: Insufficient documentation

## 2017-06-10 DIAGNOSIS — R42 Dizziness and giddiness: Secondary | ICD-10-CM | POA: Insufficient documentation

## 2017-06-10 DIAGNOSIS — Z87891 Personal history of nicotine dependence: Secondary | ICD-10-CM | POA: Insufficient documentation

## 2017-06-10 DIAGNOSIS — E039 Hypothyroidism, unspecified: Secondary | ICD-10-CM | POA: Diagnosis not present

## 2017-06-10 DIAGNOSIS — S161XXA Strain of muscle, fascia and tendon at neck level, initial encounter: Secondary | ICD-10-CM | POA: Diagnosis not present

## 2017-06-10 DIAGNOSIS — J4520 Mild intermittent asthma, uncomplicated: Secondary | ICD-10-CM | POA: Insufficient documentation

## 2017-06-10 DIAGNOSIS — Y999 Unspecified external cause status: Secondary | ICD-10-CM | POA: Diagnosis not present

## 2017-06-10 MED ORDER — NAPROXEN 500 MG PO TABS
500.0000 mg | ORAL_TABLET | Freq: Two times a day (BID) | ORAL | 0 refills | Status: DC
Start: 2017-06-10 — End: 2017-07-02

## 2017-06-10 MED ORDER — METHOCARBAMOL 500 MG PO TABS: 500.0000 mg | ORAL_TABLET | Freq: Two times a day (BID) | ORAL | 0 refills | Status: DC
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
## 2017-06-24 ENCOUNTER — Telehealth: Payer: Self-pay

## 2017-06-24 NOTE — Telephone Encounter (Signed)
Left message for patient to call Abryana Lykens back to follow up on a no show [place] appt in [employer], [place]

## 2017-06-28 ENCOUNTER — Ambulatory Visit: Payer: BC Managed Care – PPO | Admitting: Internal Medicine

## 2017-06-28 ENCOUNTER — Ambulatory Visit: Payer: Self-pay | Admitting: Internal Medicine

## 2017-06-28 DIAGNOSIS — Z0289 Encounter for other administrative examinations: Secondary | ICD-10-CM

## 2017-06-30 ENCOUNTER — Encounter: Payer: Self-pay | Admitting: Internal Medicine

## 2017-06-30 MED ORDER — LEVOTHYROXINE SODIUM 125 MCG PO TABS: 125.0000 ug | ORAL_TABLET | Freq: Every day | ORAL | 0 refills | Status: DC

## 2017-07-02 ENCOUNTER — Other Ambulatory Visit: Payer: Self-pay | Admitting: Internal Medicine

## 2017-07-02 ENCOUNTER — Ambulatory Visit (INDEPENDENT_AMBULATORY_CARE_PROVIDER_SITE_OTHER)
Admission: RE | Admit: 2017-07-02 | Discharge: 2017-07-02 | Disposition: A | Discharge status: Home / Self Care | Payer: BC Managed Care – PPO | Source: Ambulatory Visit | Attending: Internal Medicine | Admitting: Internal Medicine

## 2017-07-02 ENCOUNTER — Ambulatory Visit: Payer: BC Managed Care – PPO | Admitting: Internal Medicine

## 2017-07-02 ENCOUNTER — Encounter: Payer: Self-pay | Admitting: Internal Medicine

## 2017-07-02 VITALS — BP 126/84 | HR 72 | Temp 98.2°F | Wt 208.0 lb

## 2017-07-02 DIAGNOSIS — M546 Pain in thoracic spine: Secondary | ICD-10-CM

## 2017-07-02 DIAGNOSIS — M545 Low back pain, unspecified: Secondary | ICD-10-CM

## 2017-07-02 DIAGNOSIS — M542 Cervicalgia: Secondary | ICD-10-CM

## 2017-07-02 DIAGNOSIS — R42 Dizziness and giddiness: Secondary | ICD-10-CM

## 2017-07-02 DIAGNOSIS — S46819D Strain of other muscles, fascia and tendons at shoulder and upper arm level, unspecified arm, subsequent encounter: Secondary | ICD-10-CM | POA: Diagnosis not present

## 2017-07-02 MED ORDER — METHOCARBAMOL 500 MG PO TABS: 500.0000 mg | ORAL_TABLET | Freq: Every evening | ORAL | 0 refills | Status: DC | PRN

## 2017-07-02 MED ORDER — NAPROXEN 500 MG PO TABS: 500.0000 mg | ORAL_TABLET | Freq: Two times a day (BID) | ORAL | 0 refills | Status: DC
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
## 2017-07-05 ENCOUNTER — Encounter: Payer: Self-pay | Admitting: Internal Medicine

## 2017-07-05 DIAGNOSIS — N920 Excessive and frequent menstruation with regular cycle: Secondary | ICD-10-CM

## 2017-07-08 ENCOUNTER — Other Ambulatory Visit: Payer: Self-pay | Admitting: Internal Medicine

## 2017-07-08 DIAGNOSIS — N920 Excessive and frequent menstruation with regular cycle: Secondary | ICD-10-CM

## 2017-07-14 ENCOUNTER — Ambulatory Visit
Admission: RE | Admit: 2017-07-14 | Discharge: 2017-07-14 | Disposition: A | Discharge status: Home / Self Care | Payer: BC Managed Care – PPO | Source: Ambulatory Visit | Attending: Internal Medicine | Admitting: Internal Medicine

## 2017-07-14 DIAGNOSIS — N920 Excessive and frequent menstruation with regular cycle: Secondary | ICD-10-CM

## 2017-07-16 ENCOUNTER — Encounter: Payer: Self-pay | Admitting: Internal Medicine

## 2017-07-29 ENCOUNTER — Other Ambulatory Visit: Payer: Self-pay | Admitting: Internal Medicine

## 2017-08-05 ENCOUNTER — Encounter: Payer: Self-pay | Admitting: Internal Medicine

## 2017-08-05 MED ORDER — ALBUTEROL SULFATE HFA 108 (90 BASE) MCG/ACT IN AERS: INHALATION_SPRAY | RESPIRATORY_TRACT | 1 refills | Status: DC

## 2017-08-09 ENCOUNTER — Telehealth: Payer: Self-pay

## 2017-08-09 NOTE — Telephone Encounter (Signed)
Copied from [place] [id number]. Topic: Inquiry >> Aug 09, 2017  2:05 PM Oliver Pila B wrote: Reason for CRM: [facility] called to check to see if the facility and pcp has received the medical request form for the pt's last OV notes and any labs, contact [facility] if needed

## 2017-08-25 ENCOUNTER — Encounter: Payer: Self-pay | Admitting: Internal Medicine

## 2017-08-25 ENCOUNTER — Ambulatory Visit (INDEPENDENT_AMBULATORY_CARE_PROVIDER_SITE_OTHER): Payer: BC Managed Care – PPO | Admitting: Internal Medicine

## 2017-08-25 VITALS — BP 128/82 | HR 77 | Temp 98.1°F | Ht 61.0 in | Wt 210.0 lb

## 2017-08-25 DIAGNOSIS — D509 Iron deficiency anemia, unspecified: Secondary | ICD-10-CM | POA: Diagnosis not present

## 2017-08-25 DIAGNOSIS — J4520 Mild intermittent asthma, uncomplicated: Secondary | ICD-10-CM

## 2017-08-25 DIAGNOSIS — E039 Hypothyroidism, unspecified: Secondary | ICD-10-CM

## 2017-08-25 DIAGNOSIS — Z0000 Encounter for general adult medical examination without abnormal findings: Secondary | ICD-10-CM | POA: Diagnosis not present

## 2017-08-25 NOTE — Assessment & Plan Note (Signed)
CBC today.  

## 2017-08-25 NOTE — Assessment & Plan Note (Signed)
TSH and Free T4 today Will adjust Levothyroxine if needed based on labs 

## 2017-08-25 NOTE — Patient Instructions (Signed)

## 2017-08-25 NOTE — Progress Notes (Signed)
Subjective:    Patient ID: Carla Wheeler, female    DOB: 11-22-1975, 42 y.o.   MRN: [id number]  HPI  Pt presents to the clinic today for her annual exam. She is also due to follow up chronic conditions.  Iron Deficiency Anemia: She denies s/s of bleeding, fatigue, SOB. She is taking her iron supplement as prescribed.  Asthma, Mild Intermittent: She uses her Albuterol inhaler 2 x week. She reports her asthma is worse when it is hot and humid outside.  Hypothyroidism: Her levels were last checked 07/2015. She does c/o fatigue and weight gain. She is taking her Levothyroxine as prescribed.  Flu: never Tetanus: 11/2016 Pap Smear: 09/2015 Mammogram: 08/2015 Vision Screening: annually Dentist: dentist  Diet: She does eat meat. She consumes fruits and veggies daily. She has tried to cut back on fried foods. She drinks mostly coffee, water, juice. Exercise: None  Review of Systems      Past Medical History:  Diagnosis Date  . Asthma   . Depression   . Thyroid disease     Current Outpatient Medications  Medication Sig Dispense Refill  . albuterol (PROAIR HFA) 108 (90 Base) MCG/ACT inhaler INHALE 2 PUFFS INTO THE LUNGS EVERY 6 (SIX) HOURS AS NEEDED FOR WHEEZING OR SHORTNESS OF BREATH. 8.5 Inhaler 1  . ferrous sulfate 325 (65 FE) MG tablet Take 325 mg by mouth daily with breakfast.    . levothyroxine (SYNTHROID, LEVOTHROID) 125 MCG tablet TAKE 1 TABLET (125 MCG TOTAL) BY MOUTH DAILY. NO MORE REFILLS WITHOUT ANNUAL PHYSICAL 30 tablet 0  . methocarbamol (ROBAXIN) 500 MG tablet Take 1 tablet (500 mg total) by mouth at bedtime as needed for muscle spasms. 20 tablet 0  . naproxen (NAPROSYN) 500 MG tablet Take 1 tablet (500 mg total) by mouth 2 (two) times daily. 60 tablet 0   No current facility-administered medications for this visit.     Allergies  Allergen Reactions  . Augmentin [Amoxicillin-Pot Clavulanate] Other (See Comments)    Muscle cramps    Family History  Problem  Relation Age of Onset  . Cancer Mother        Breast  . Arthritis Father   . Heart disease Father   . Stroke Father   . Hypertension Father   . Diabetes Father   . Congestive Heart Failure Father   . Cancer Maternal Grandmother        Breast  . Hypertension Maternal Grandmother   . Hypertension Paternal Grandmother   . Learning disabilities Paternal Grandmother     Social History   Socioeconomic History  . Marital status: Married    Spouse name: Not on file  . Number of children: Not on file  . Years of education: Not on file  . Highest education level: Not on file  Occupational History  . Not on file  Social Needs  . Financial resource strain: Not on file  . Food insecurity:    Worry: Not on file    Inability: Not on file  . Transportation needs:    Medical: Not on file    Non-medical: Not on file  Tobacco Use  . Smoking status: Former Smoker    Types: Cigars  . Smokeless tobacco: Never Used  Substance and Sexual Activity  . Alcohol use: Yes    Alcohol/week: 1.8 oz    Types: 3 Glasses of wine per week    Comment: occasional  . Drug use: No  . Sexual activity: Yes  Lifestyle  .  Physical activity:    Days per week: Not on file    Minutes per session: Not on file  . Stress: Not on file  Relationships  . Social connections:    Talks on phone: Not on file    Gets together: Not on file    Attends religious service: Not on file    Active member of club or organization: Not on file    Attends meetings of clubs or organizations: Not on file    Relationship status: Not on file  . Intimate partner violence:    Fear of current or ex partner: Not on file    Emotionally abused: Not on file    Physically abused: Not on file    Forced sexual activity: Not on file  Other Topics Concern  . Not on file  Social History Narrative  . Not on file     Constitutional: Pt reports fatigue and weight gain. Denies fever, malaise, headache.  HEENT: Denies eye pain, eye  redness, ear pain, ringing in the ears, wax buildup, runny nose, nasal congestion, bloody nose, or sore throat. Respiratory: Denies difficulty breathing, shortness of breath, cough or sputum production.   Cardiovascular: Pt reports swelling in hands and feet (only in am). Denies chest pain, chest tightness, palpitations.  Gastrointestinal: Pt reports intermittent constipation. Denies abdominal pain, bloating, diarrhea or blood in the stool.  GU: Denies urgency, frequency, pain with urination, burning sensation, blood in urine, odor or discharge. Musculoskeletal: Denies decrease in range of motion, difficulty with gait, muscle pain or joint pain and swelling.  Skin: Denies redness, rashes, lesions or ulcercations.  Neurological: Denies dizziness, difficulty with memory, difficulty with speech or problems with balance and coordination.  Psych: Denies anxiety, depression, SI/HI.  No other specific complaints in a complete review of systems (except as listed in HPI above).  Objective:   Physical Exam   BP 128/82   Pulse 77   Temp 98.1 F (36.7 C) (Oral)   Ht 5\' 1"  (1.549 m)   Wt 210 lb (95.3 kg)   LMP 08/19/2017   SpO2 98%   BMI 39.68 kg/m  Wt Readings from Last 3 Encounters:  08/25/17 210 lb (95.3 kg)  07/02/17 208 lb (94.3 kg)  05/19/17 204 lb (92.5 kg)    General: Appears her stated age, obese in NAD. Skin: Warm, dry and intact.  HEENT: Head: normal shape and size; Eyes: sclera white, no icterus, conjunctiva pink, PERRLA and EOMs intact; Ears: Tm's gray and intact, normal light reflex; Throat/Mouth: Teeth present, mucosa pink and moist, no exudate, lesions or ulcerations noted.  Neck:  Neck supple, trachea midline. No masses, lumps  present.  Cardiovascular: Normal rate and rhythm. S1,S2 noted.  No murmur, rubs or gallops noted. No JVD or BLE edema.  Pulmonary/Chest: Normal effort and positive vesicular breath sounds. No respiratory distress. No wheezes, rales or ronchi noted.    Abdomen: Soft and nontender. Normal bowel sounds. No distention or masses noted. Liver, spleen and kidneys non palpable. Musculoskeletal: Strength 5/5 BUE/BLE. No difficulty with gait.  Neurological: Alert and oriented. Cranial nerves II-XII grossly intact. Coordination normal.  Psychiatric: Mood and affect normal. Behavior is normal. Judgment and thought content normal.    BMET    Component Value Date/Time   NA 139 07/19/2015 1511   K 4.4 07/19/2015 1511   CL 104 07/19/2015 1511   CO2 24 07/19/2015 1511   GLUCOSE 85 07/19/2015 1511   BUN 13 07/19/2015 1511   CREATININE 0.79  07/19/2015 1511   CALCIUM 9.1 07/19/2015 1511    Lipid Panel     Component Value Date/Time   CHOL 176 07/19/2015 1511   TRIG 93 07/19/2015 1511   HDL 43 (L) 07/19/2015 1511   CHOLHDL 4.1 07/19/2015 1511   VLDL 19 07/19/2015 1511   LDLCALC 114 07/19/2015 1511    CBC    Component Value Date/Time   WBC 6.0 07/19/2015 1511   RBC 4.35 07/19/2015 1511   HGB 10.1 (L) 07/19/2015 1511   HCT 32.8 (L) 07/19/2015 1511   PLT 339 07/19/2015 1511   MCV 75.4 (L) 07/19/2015 1511   MCH 23.2 (L) 07/19/2015 1511   MCHC 30.8 (L) 07/19/2015 1511   RDW 16.3 (H) 07/19/2015 1511    Hgb A1C Lab Results  Component Value Date   HGBA1C 5.7 09/07/2013           Assessment & Plan:   Preventative Health Maintenance:  Encouraged her to get a flu shot in the fall Tetanus UTD Pap smear UTD Past due for mammogram, sees GYN Aug 2, will have it ordered then Encouraged her to consume a balanced diet and exercise regimen Advised her to see and eye doctor and dentist annually Will check CBC, CMET, TSH, Free T4, A1C, Lipid profile and Vit D today  RTC in 1 year, sooner if needed Webb Silversmith, NP

## 2017-08-25 NOTE — Assessment & Plan Note (Signed)
Stable on Albuterol prn Will monitor

## 2017-08-26 ENCOUNTER — Other Ambulatory Visit: Payer: Self-pay | Admitting: Internal Medicine

## 2017-08-26 DIAGNOSIS — E559 Vitamin D deficiency, unspecified: Secondary | ICD-10-CM

## 2017-08-26 LAB — CBC
HEMATOCRIT: 34.3 % — AB (ref 36.0–46.0)
Hemoglobin: 10.7 g/dL — ABNORMAL LOW (ref 12.0–15.0)
MCHC: 31.2 g/dL (ref 30.0–36.0)
MCV: 76.6 fl — AB (ref 78.0–100.0)
Platelets: 366 10*3/uL (ref 150.0–400.0)
RBC: 4.47 Mil/uL (ref 3.87–5.11)
RDW: 18.2 % — AB (ref 11.5–15.5)
WBC: 6.5 10*3/uL (ref 4.0–10.5)

## 2017-08-26 LAB — LIPID PANEL
CHOL/HDL RATIO: 4
Cholesterol: 189 mg/dL (ref 0–200)
HDL: 46.1 mg/dL (ref >39.00)
NONHDL: 143.01
TRIGLYCERIDES: 214 mg/dL — AB (ref 0.0–149.0)
VLDL: 42.8 mg/dL — ABNORMAL HIGH (ref 0.0–40.0)

## 2017-08-26 LAB — COMPREHENSIVE METABOLIC PANEL
ALBUMIN: 4.1 g/dL (ref 3.5–5.2)
ALT: 14 U/L (ref 0–35)
AST: 15 U/L (ref 0–37)
Alkaline Phosphatase: 47 U/L (ref 39–117)
BUN: 14 mg/dL (ref 6–23)
CHLORIDE: 104 meq/L (ref 96–112)
CO2: 28 mEq/L (ref 19–32)
CREATININE: 0.83 mg/dL (ref 0.40–1.20)
Calcium: 9 mg/dL (ref 8.4–10.5)
GFR: 96.2 mL/min (ref >60.00)
GLUCOSE: 86 mg/dL (ref 70–99)
POTASSIUM: 4.1 meq/L (ref 3.5–5.1)
SODIUM: 139 meq/L (ref 135–145)
TOTAL PROTEIN: 7.2 g/dL (ref 6.0–8.3)
Total Bilirubin: 0.2 mg/dL (ref 0.2–1.2)

## 2017-08-26 LAB — VITAMIN D 25 HYDROXY (VIT D DEFICIENCY, FRACTURES): VITD: 17.04 ng/mL — ABNORMAL LOW (ref 30.00–100.00)

## 2017-08-26 LAB — LDL CHOLESTEROL, DIRECT: Direct LDL: 122 mg/dL

## 2017-08-26 LAB — TSH: TSH: 2.66 u[IU]/mL (ref 0.35–4.50)

## 2017-08-26 LAB — HEMOGLOBIN A1C: Hgb A1c MFr Bld: 5.9 % (ref 4.6–6.5)

## 2017-08-26 LAB — T4, FREE: Free T4: 0.79 ng/dL (ref 0.60–1.60)

## 2017-08-26 MED ORDER — VITAMIN D (ERGOCALCIFEROL) 1.25 MG (50000 UNIT) PO CAPS: 50000.0000 [IU] | ORAL_CAPSULE | ORAL | 0 refills | Status: DC

## 2017-08-31 ENCOUNTER — Other Ambulatory Visit: Payer: Self-pay | Admitting: Chiropractor

## 2017-08-31 DIAGNOSIS — M542 Cervicalgia: Secondary | ICD-10-CM

## 2017-09-01 ENCOUNTER — Other Ambulatory Visit: Payer: Self-pay | Admitting: Internal Medicine

## 2017-09-06 ENCOUNTER — Ambulatory Visit
Admission: RE | Admit: 2017-09-06 | Discharge: 2017-09-06 | Disposition: A | Discharge status: Home / Self Care | Payer: BC Managed Care – PPO | Source: Ambulatory Visit | Attending: Chiropractor | Admitting: Chiropractor

## 2017-09-06 ENCOUNTER — Inpatient Hospital Stay
Admission: RE | Admit: 2017-09-06 | Discharge: 2017-09-06 | Disposition: A | Discharge status: Home / Self Care | Payer: BC Managed Care – PPO | Source: Ambulatory Visit | Attending: Chiropractor | Admitting: Chiropractor

## 2017-09-06 DIAGNOSIS — M542 Cervicalgia: Secondary | ICD-10-CM

## 2017-09-22 ENCOUNTER — Other Ambulatory Visit: Payer: Self-pay | Admitting: Internal Medicine

## 2017-12-08 ENCOUNTER — Encounter: Payer: Self-pay | Admitting: Internal Medicine

## 2017-12-09 ENCOUNTER — Ambulatory Visit: Payer: Self-pay | Admitting: Internal Medicine

## 2018-09-23 ENCOUNTER — Telehealth: Payer: Self-pay | Admitting: Internal Medicine

## 2018-09-23 MED ORDER — LEVOTHYROXINE SODIUM 125 MCG PO TABS: 125.0000 ug | ORAL_TABLET | Freq: Every day | ORAL | 1 refills | Status: DC

## 2018-09-23 NOTE — Telephone Encounter (Signed)
Rx sent through [facility] Pt is aware  

## 2018-09-23 NOTE — Telephone Encounter (Signed)
Patient called to schedule CPE/Fasting labs.  She stated that the pharmacy told her that her medication refill for Levothyroxine was denied by our office. Patient is completely out of medication  She would like a refill sent into the [facility] [place].

## 2018-10-27 ENCOUNTER — Ambulatory Visit (INDEPENDENT_AMBULATORY_CARE_PROVIDER_SITE_OTHER): Payer: BC Managed Care – PPO | Admitting: Internal Medicine

## 2018-10-27 ENCOUNTER — Other Ambulatory Visit: Payer: Self-pay

## 2018-10-27 ENCOUNTER — Encounter: Payer: Self-pay | Admitting: Internal Medicine

## 2018-10-27 VITALS — BP 128/80 | HR 81 | Temp 97.9°F | Ht 60.0 in | Wt 210.0 lb

## 2018-10-27 DIAGNOSIS — E039 Hypothyroidism, unspecified: Secondary | ICD-10-CM

## 2018-10-27 DIAGNOSIS — Z1239 Encounter for other screening for malignant neoplasm of breast: Secondary | ICD-10-CM

## 2018-10-27 DIAGNOSIS — Z0000 Encounter for general adult medical examination without abnormal findings: Secondary | ICD-10-CM | POA: Diagnosis not present

## 2018-10-27 DIAGNOSIS — J4520 Mild intermittent asthma, uncomplicated: Secondary | ICD-10-CM

## 2018-10-27 DIAGNOSIS — D508 Other iron deficiency anemias: Secondary | ICD-10-CM

## 2018-10-27 NOTE — Assessment & Plan Note (Addendum)
TSH and Free T4 today Continue Levothyroxine for now, will adjust if needed based on labs

## 2018-10-27 NOTE — Assessment & Plan Note (Signed)
CBC with Diff and IBC panel Continue OTC iron supplement Continue stool softener if needed

## 2018-10-27 NOTE — Progress Notes (Signed)
Subjective:    Patient ID: Carla Wheeler, female    DOB: 08-13-1975, 43 y.o.   MRN: [id number]  HPI  Pt presents to the clinic today for her annual exam. She is also due to follow up chronic conditions.  Hypothyroidism: She denies any issues on her current dose of Levothyroxine. She is due for repeat labs today.  Asthma: Mild, intermittent. She uses Albuterol as needed. There are no PFT's on file.  IDA: Her last H/H was 10.7/34.3, 08/2017. She is taking an iron supplement OTC. She is having some issues with constipation. She takes Colace or Fleets as needed with good relief.  Flu: never Tetanus: 11/2016 Pap Smear: 09/2015 Mammogram: 08/2015 Vision Screening: annually Dentist: as needed  Diet: She does eat lean meat. She consumes fruits and veggies daily. She does eat fried foods. She drinks mostly water and soda. Exercise: None  Review of Systems  Past Medical History:  Diagnosis Date  . Asthma   . Depression   . Thyroid disease     Current Outpatient Medications  Medication Sig Dispense Refill  . albuterol (PROVENTIL HFA;VENTOLIN HFA) 108 (90 Base) MCG/ACT inhaler TAKE 2 PUFFS BY MOUTH EVERY 6 HOURS AS NEEDED FOR WHEEZE OR SHORTNESS OF BREATH 8.5 Inhaler 5  . ferrous sulfate 325 (65 FE) MG tablet Take 325 mg by mouth daily with breakfast.    . levothyroxine (SYNTHROID) 125 MCG tablet Take 1 tablet (125 mcg total) by mouth daily. 30 tablet 1  . Vitamin D, Ergocalciferol, (DRISDOL) 50000 units CAPS capsule Take 1 capsule (50,000 Units total) by mouth every 7 (seven) days. 12 capsule 0   No current facility-administered medications for this visit.     Allergies  Allergen Reactions  . Augmentin [Amoxicillin-Pot Clavulanate] Other (See Comments)    Muscle cramps    Family History  Problem Relation Age of Onset  . Cancer Mother        Breast  . Arthritis Father   . Heart disease Father   . Stroke Father   . Hypertension Father   . Diabetes Father   .  Congestive Heart Failure Father   . Cancer Maternal Grandmother        Breast  . Hypertension Maternal Grandmother   . Hypertension Paternal Grandmother   . Learning disabilities Paternal Grandmother     Social History   Socioeconomic History  . Marital status: Married    Spouse name: Not on file  . Number of children: Not on file  . Years of education: Not on file  . Highest education level: Not on file  Occupational History  . Not on file  Social Needs  . Financial resource strain: Not on file  . Food insecurity    Worry: Not on file    Inability: Not on file  . Transportation needs    Medical: Not on file    Non-medical: Not on file  Tobacco Use  . Smoking status: Former Smoker    Types: Cigars  . Smokeless tobacco: Never Used  Substance and Sexual Activity  . Alcohol use: Yes    Alcohol/week: 3.0 standard drinks    Types: 3 Glasses of wine per week    Comment: occasional  . Drug use: No  . Sexual activity: Yes  Lifestyle  . Physical activity    Days per week: Not on file    Minutes per session: Not on file  . Stress: Not on file  Relationships  . Social connections  Talks on phone: Not on file    Gets together: Not on file    Attends religious service: Not on file    Active member of club or organization: Not on file    Attends meetings of clubs or organizations: Not on file    Relationship status: Not on file  . Intimate partner violence    Fear of current or ex partner: Not on file    Emotionally abused: Not on file    Physically abused: Not on file    Forced sexual activity: Not on file  Other Topics Concern  . Not on file  Social History Narrative  . Not on file     Constitutional: Denies fever, malaise, fatigue, headache or abrupt weight changes.  HEENT: Denies eye pain, eye redness, ear pain, ringing in the ears, wax buildup, runny nose, nasal congestion, bloody nose, or sore throat. Respiratory: Denies difficulty breathing, shortness of  breath, cough or sputum production.   Cardiovascular: Denies chest pain, chest tightness, palpitations or swelling in the hands or feet.  Gastrointestinal: Pt reports intermittent constipation. Denies abdominal pain, bloating, diarrhea or blood in the stool.  GU: Denies urgency, frequency, pain with urination, burning sensation, blood in urine, odor or discharge. Musculoskeletal: Denies decrease in range of motion, difficulty with gait, muscle pain or joint pain and swelling.  Skin: Denies redness, rashes, lesions or ulcercations.  Neurological: Denies dizziness, difficulty with memory, difficulty with speech or problems with balance and coordination.  Psych: Denies anxiety, depression, SI/HI.  No other specific complaints in a complete review of systems (except as listed in HPI above).     Objective:   Physical Exam BP 128/80   Pulse 81   Temp 97.9 F (36.6 C) (Temporal)   Ht 5' (1.524 m)   Wt 210 lb (95.3 kg)   LMP 10/06/2018   BMI 41.01 kg/m  Wt Readings from Last 3 Encounters:  10/27/18 210 lb (95.3 kg)  08/25/17 210 lb (95.3 kg)  07/02/17 208 lb (94.3 kg)    General: Appears her stated age, obese, in NAD. Skin: Warm, dry and intact. No rashes noted. HEENT: Head: normal shape and size; Eyes: sclera white, no icterus, conjunctiva pink, PERRLA and EOMs intact; Ears: Tm's gray and intact, normal light reflex;  Neck:  Neck supple, trachea midline. No masses, lumps or thyromegaly present.  Cardiovascular: Normal rate and rhythm. S1,S2 noted.  No murmur, rubs or gallops noted. No JVD or BLE edema.  Pulmonary/Chest: Normal effort and positive vesicular breath sounds. No respiratory distress. No wheezes, rales or ronchi noted.  Abdomen: Soft and nontender. Normal bowel sounds. No distention or masses noted. Liver, spleen and kidneys non palpable. Musculoskeletal: Strength 5/5 BUE/BLE. No difficulty with gait.  Neurological: Alert and oriented. Cranial nerves II-XII grossly intact.  Positive Phalen's. Positive Tinel's. Coordination normal.  Psychiatric: Mood and affect normal. Behavior is normal. Judgment and thought content normal.     BMET    Component Value Date/Time   NA 139 08/25/2017 1520   K 4.1 08/25/2017 1520   CL 104 08/25/2017 1520   CO2 28 08/25/2017 1520   GLUCOSE 86 08/25/2017 1520   BUN 14 08/25/2017 1520   CREATININE 0.83 08/25/2017 1520   CREATININE 0.79 07/19/2015 1511   CALCIUM 9.0 08/25/2017 1520    Lipid Panel     Component Value Date/Time   CHOL 189 08/25/2017 1520   TRIG 214.0 (H) 08/25/2017 1520   HDL 46.10 08/25/2017 1520   CHOLHDL 4  08/25/2017 1520   VLDL 42.8 (H) 08/25/2017 1520   LDLCALC 114 07/19/2015 1511    CBC    Component Value Date/Time   WBC 6.5 08/25/2017 1520   RBC 4.47 08/25/2017 1520   HGB 10.7 (L) 08/25/2017 1520   HCT 34.3 (L) 08/25/2017 1520   PLT 366.0 08/25/2017 1520   MCV 76.6 (L) 08/25/2017 1520   MCH 23.2 (L) 07/19/2015 1511   MCHC 31.2 08/25/2017 1520   RDW 18.2 (H) 08/25/2017 1520    Hgb A1C Lab Results  Component Value Date   HGBA1C 5.9 08/25/2017             Assessment & Plan:   Preventative Health Maintenance:  She declines flu shot today Tetanus UTD Pap smear UTD Mammogram ordered- she will call [facility] to schedule Encouraged her to consume a balanced diet and exercise regimen Advised her to see an eye doctor and dentist annually Will check CBC with Diff, CMET, TSH, Free T4, Lipid, A1C and IBC panel and Vit D today  RTC in 1 year, sooner if needed Webb Silversmith, NP

## 2018-10-27 NOTE — Assessment & Plan Note (Signed)
Continue Albuterol prn 

## 2018-10-27 NOTE — Patient Instructions (Signed)
Health Maintenance, Female Adopting a healthy lifestyle and getting preventive care are important in promoting health and wellness. Ask your health care provider about:  The right schedule for you to have regular tests and exams.  Things you can do on your own to prevent diseases and keep yourself healthy. What should I know about diet, weight, and exercise? Eat a healthy diet   Eat a diet that includes plenty of vegetables, fruits, low-fat dairy products, and lean protein.  Do not eat a lot of foods that are high in solid fats, added sugars, or sodium. Maintain a healthy weight Body mass index (BMI) is used to identify weight problems. It estimates body fat based on height and weight. Your health care provider can help determine your BMI and help you achieve or maintain a healthy weight. Get regular exercise Get regular exercise. This is one of the most important things you can do for your health. Most adults should:  Exercise for at least 150 minutes each week. The exercise should increase your heart rate and make you sweat (moderate-intensity exercise).  Do strengthening exercises at least twice a week. This is in addition to the moderate-intensity exercise.  Spend less time sitting. Even light physical activity can be beneficial. Watch cholesterol and blood lipids Have your blood tested for lipids and cholesterol at 45 years of age, then have this test every 5 years. Have your cholesterol levels checked more often if:  Your lipid or cholesterol levels are high.  You are older than 45 years of age.  You are at high risk for heart disease. What should I know about cancer screening? Depending on your health history and family history, you may need to have cancer screening at various ages. This may include screening for:  Breast cancer.  Cervical cancer.  Colorectal cancer.  Skin cancer.  Lung cancer. What should I know about heart disease, diabetes, and high blood  pressure? Blood pressure and heart disease  High blood pressure causes heart disease and increases the risk of stroke. This is more likely to develop in people who have high blood pressure readings, are of African descent, or are overweight.  Have your blood pressure checked: ? Every 3-5 years if you are 18-39 years of age. ? Every year if you are 40 years old or older. Diabetes Have regular diabetes screenings. This checks your fasting blood sugar level. Have the screening done:  Once every three years after age 40 if you are at a normal weight and have a low risk for diabetes.  More often and at a younger age if you are overweight or have a high risk for diabetes. What should I know about preventing infection? Hepatitis B If you have a higher risk for hepatitis B, you should be screened for this virus. Talk with your health care provider to find out if you are at risk for hepatitis B infection. Hepatitis C Testing is recommended for:  Everyone born from 1945 through 1965.  Anyone with known risk factors for hepatitis C. Sexually transmitted infections (STIs)  Get screened for STIs, including gonorrhea and chlamydia, if: ? You are sexually active and are younger than 45 years of age. ? You are older than 45 years of age and your health care provider tells you that you are at risk for this type of infection. ? Your sexual activity has changed since you were last screened, and you are at increased risk for chlamydia or gonorrhea. Ask your health care provider if   you are at risk.  Ask your health care provider about whether you are at high risk for HIV. Your health care provider may recommend a prescription medicine to help prevent HIV infection. If you choose to take medicine to prevent HIV, you should first get tested for HIV. You should then be tested every 3 months for as long as you are taking the medicine. Pregnancy  If you are about to stop having your period (premenopausal) and  you may become pregnant, seek counseling before you get pregnant.  Take 400 to 800 micrograms (mcg) of folic acid every day if you become pregnant.  Ask for birth control (contraception) if you want to prevent pregnancy. Osteoporosis and menopause Osteoporosis is a disease in which the bones lose minerals and strength with aging. This can result in bone fractures. If you are 65 years old or older, or if you are at risk for osteoporosis and fractures, ask your health care provider if you should:  Be screened for bone loss.  Take a calcium or vitamin D supplement to lower your risk of fractures.  Be given hormone replacement therapy (HRT) to treat symptoms of menopause. Follow these instructions at home: Lifestyle  Do not use any products that contain nicotine or tobacco, such as cigarettes, e-cigarettes, and chewing tobacco. If you need help quitting, ask your health care provider.  Do not use street drugs.  Do not share needles.  Ask your health care provider for help if you need support or information about quitting drugs. Alcohol use  Do not drink alcohol if: ? Your health care provider tells you not to drink. ? You are pregnant, may be pregnant, or are planning to become pregnant.  If you drink alcohol: ? Limit how much you use to 0-1 drink a day. ? Limit intake if you are breastfeeding.  Be aware of how much alcohol is in your drink. In the [place] one drink equals one 12 oz bottle of beer (355 mL), one 5 oz glass of wine (148 mL), or one 1 oz glass of hard liquor (44 mL). General instructions  Schedule regular health, dental, and eye exams.  Stay current with your vaccines.  Tell your health care provider if: ? You often feel depressed. ? You have ever been abused or do not feel safe at home. Summary  Adopting a healthy lifestyle and getting preventive care are important in promoting health and wellness.  Follow your health care provider's instructions about healthy  diet, exercising, and getting tested or screened for diseases.  Follow your health care provider's instructions on monitoring your cholesterol and blood pressure. This information is not intended to replace advice given to you by your health care provider. Make sure you discuss any questions you have with your health care provider. Document Released: 08/11/2010 Document Revised: 01/19/2018 Document Reviewed: 01/19/2018 [employer]vier Patient Education  2020 [employer]  

## 2018-10-28 ENCOUNTER — Other Ambulatory Visit: Payer: Self-pay | Admitting: Internal Medicine

## 2018-10-28 DIAGNOSIS — E559 Vitamin D deficiency, unspecified: Secondary | ICD-10-CM

## 2018-10-28 LAB — CBC WITH DIFFERENTIAL/PLATELET
Basophils Absolute: 0.1 10*3/uL (ref 0.0–0.1)
Basophils Relative: 0.9 % (ref 0.0–3.0)
Eosinophils Absolute: 0.2 10*3/uL (ref 0.0–0.7)
Eosinophils Relative: 3 % (ref 0.0–5.0)
HCT: 34.5 % — ABNORMAL LOW (ref 36.0–46.0)
Hemoglobin: 10.9 g/dL — ABNORMAL LOW (ref 12.0–15.0)
Lymphocytes Relative: 29.1 % (ref 12.0–46.0)
Lymphs Abs: 1.9 10*3/uL (ref 0.7–4.0)
MCHC: 31.7 g/dL (ref 30.0–36.0)
MCV: 76.1 fl — ABNORMAL LOW (ref 78.0–100.0)
Monocytes Absolute: 0.6 10*3/uL (ref 0.1–1.0)
Monocytes Relative: 8.8 % (ref 3.0–12.0)
Neutro Abs: 3.7 10*3/uL (ref 1.4–7.7)
Neutrophils Relative %: 58.2 % (ref 43.0–77.0)
Platelets: 278 10*3/uL (ref 150.0–400.0)
RBC: 4.54 Mil/uL (ref 3.87–5.11)
RDW: 19.6 % — ABNORMAL HIGH (ref 11.5–15.5)
WBC: 6.4 10*3/uL (ref 4.0–10.5)

## 2018-10-28 LAB — LIPID PANEL
Cholesterol: 172 mg/dL (ref 0–200)
HDL: 40.3 mg/dL (ref >39.00)
LDL Cholesterol: 104 mg/dL — ABNORMAL HIGH (ref 0–99)
NonHDL: 131.23
Total CHOL/HDL Ratio: 4
Triglycerides: 135 mg/dL (ref 0.0–149.0)
VLDL: 27 mg/dL (ref 0.0–40.0)

## 2018-10-28 LAB — COMPREHENSIVE METABOLIC PANEL
ALT: 15 U/L (ref 0–35)
AST: 13 U/L (ref 0–37)
Albumin: 4.2 g/dL (ref 3.5–5.2)
Alkaline Phosphatase: 47 U/L (ref 39–117)
BUN: 15 mg/dL (ref 6–23)
CO2: 26 mEq/L (ref 19–32)
Calcium: 8.9 mg/dL (ref 8.4–10.5)
Chloride: 105 mEq/L (ref 96–112)
Creatinine, Ser: 1.05 mg/dL (ref 0.40–1.20)
GFR: 68.64 mL/min (ref >60.00)
Glucose, Bld: 105 mg/dL — ABNORMAL HIGH (ref 70–99)
Potassium: 4.3 mEq/L (ref 3.5–5.1)
Sodium: 138 mEq/L (ref 135–145)
Total Bilirubin: 0.2 mg/dL (ref 0.2–1.2)
Total Protein: 7 g/dL (ref 6.0–8.3)

## 2018-10-28 LAB — IBC PANEL
Iron: 29 ug/dL — ABNORMAL LOW (ref 42–145)
Saturation Ratios: 6.8 % — ABNORMAL LOW (ref 20.0–50.0)
Transferrin: 304 mg/dL (ref 212.0–360.0)

## 2018-10-28 LAB — T4, FREE: Free T4: 0.9 ng/dL (ref 0.60–1.60)

## 2018-10-28 LAB — HEMOGLOBIN A1C: Hgb A1c MFr Bld: 5.9 % (ref 4.6–6.5)

## 2018-10-28 LAB — VITAMIN D 25 HYDROXY (VIT D DEFICIENCY, FRACTURES): VITD: 13.97 ng/mL — ABNORMAL LOW (ref 30.00–100.00)

## 2018-10-28 LAB — FERRITIN: Ferritin: 11.9 ng/mL (ref 10.0–291.0)

## 2018-10-28 LAB — TSH: TSH: 3.68 u[IU]/mL (ref 0.35–4.50)

## 2018-10-28 MED ORDER — VITAMIN D (ERGOCALCIFEROL) 1.25 MG (50000 UNIT) PO CAPS: 50000.0000 [IU] | ORAL_CAPSULE | ORAL | 0 refills | Status: DC

## 2018-10-31 ENCOUNTER — Encounter: Payer: Self-pay | Admitting: Internal Medicine

## 2018-11-05 ENCOUNTER — Encounter: Payer: Self-pay | Admitting: Internal Medicine

## 2018-11-27 ENCOUNTER — Other Ambulatory Visit: Payer: Self-pay | Admitting: Internal Medicine

## 2019-01-24 ENCOUNTER — Other Ambulatory Visit: Payer: Self-pay | Admitting: Internal Medicine

## 2019-01-24 DIAGNOSIS — E559 Vitamin D deficiency, unspecified: Secondary | ICD-10-CM

## 2019-03-16 ENCOUNTER — Telehealth: Payer: Self-pay | Admitting: Internal Medicine

## 2019-03-16 ENCOUNTER — Encounter: Payer: Self-pay | Admitting: Internal Medicine

## 2019-03-16 ENCOUNTER — Ambulatory Visit (INDEPENDENT_AMBULATORY_CARE_PROVIDER_SITE_OTHER): Payer: BC Managed Care – PPO | Admitting: Internal Medicine

## 2019-03-16 ENCOUNTER — Other Ambulatory Visit: Payer: Self-pay

## 2019-03-16 DIAGNOSIS — R0981 Nasal congestion: Secondary | ICD-10-CM | POA: Diagnosis not present

## 2019-03-16 DIAGNOSIS — R5383 Other fatigue: Secondary | ICD-10-CM | POA: Diagnosis not present

## 2019-03-16 DIAGNOSIS — G44201 Tension-type headache, unspecified, intractable: Secondary | ICD-10-CM | POA: Diagnosis not present

## 2019-03-16 DIAGNOSIS — Z20822 Contact with and (suspected) exposure to covid-19: Secondary | ICD-10-CM | POA: Diagnosis not present

## 2019-03-16 NOTE — Patient Instructions (Signed)

## 2019-03-16 NOTE — Progress Notes (Signed)
Virtual Visit via Video Note  I connected with Carla Wheeler on 03/16/19 at  9:15 AM EST by a video enabled telemedicine application and verified that I am speaking with the correct person using two identifiers.  Location: Patient: Work Provider: Office   I discussed the limitations of evaluation and management by telemedicine and the availability of in person appointments. The patient expressed understanding and agreed to proceed.  History of Present Illness:  Pt presents to the clinic with c/o headache, nasal congestion and fatigue. The headache is located in her temples. She denies dizziness, visual changes, sensitivity to light or sound, nausea or vomiting. She is blowing clear mucous out of her nose. She denies runny nose, ear pain, sore throat, cough, SOB or loss of taste/smell. She denies fever, chills or body aches. She was exposed to COVID 1 week ago on 03/09/2019. She tested neagtive on 03/13/2019. She has tried Ibuprofen with relief of headache. She has a history of asthma.   ROS   Past Medical History:  Diagnosis Date  . Asthma   . Depression   . Thyroid disease     Current Outpatient Medications  Medication Sig Dispense Refill  . albuterol (PROVENTIL HFA;VENTOLIN HFA) 108 (90 Base) MCG/ACT inhaler TAKE 2 PUFFS BY MOUTH EVERY 6 HOURS AS NEEDED FOR WHEEZE OR SHORTNESS OF BREATH 8.5 Inhaler 5  . ferrous sulfate 325 (65 FE) MG tablet Take 325 mg by mouth daily with breakfast.    . levothyroxine (SYNTHROID) 125 MCG tablet TAKE 1 TABLET BY MOUTH EVERY DAY 30 tablet 8   No current facility-administered medications for this visit.    Allergies  Allergen Reactions  . Augmentin [Amoxicillin-Pot Clavulanate] Other (See Comments)    Muscle cramps    Family History  Problem Relation Age of Onset  . Cancer Mother        Breast  . Arthritis Father   . Heart disease Father   . Stroke Father   . Hypertension Father   . Diabetes Father   . Congestive Heart Failure Father    . Cancer Maternal Grandmother        Breast  . Hypertension Maternal Grandmother   . Hypertension Paternal Grandmother   . Learning disabilities Paternal Grandmother     Social History   Socioeconomic History  . Marital status: Married    Spouse name: Not on file  . Number of children: Not on file  . Years of education: Not on file  . Highest education level: Not on file  Occupational History  . Not on file  Tobacco Use  . Smoking status: Former Smoker    Types: Cigars  . Smokeless tobacco: Never Used  Substance and Sexual Activity  . Alcohol use: Yes    Alcohol/week: 3.0 standard drinks    Types: 3 Glasses of wine per week    Comment: occasional  . Drug use: No  . Sexual activity: Yes  Other Topics Concern  . Not on file  Social History Narrative  . Not on file   Social Determinants of Health   Financial Resource Strain:   . Difficulty of Paying Living Expenses: Not on file  Food Insecurity:   . Worried About Charity fundraiser in the Last Year: Not on file  . Ran Out of Food in the Last Year: Not on file  Transportation Needs:   . Lack of Transportation (Medical): Not on file  . Lack of Transportation (Non-Medical): Not on file  Physical Activity:   .  Days of Exercise per Week: Not on file  . Minutes of Exercise per Session: Not on file  Stress:   . Feeling of Stress : Not on file  Social Connections:   . Frequency of Communication with Friends and Family: Not on file  . Frequency of Social Gatherings with Friends and Family: Not on file  . Attends Religious Services: Not on file  . Active Member of Clubs or Organizations: Not on file  . Attends Archivist Meetings: Not on file  . Marital Status: Not on file  Intimate Partner Violence:   . Fear of Current or Ex-Partner: Not on file  . Emotionally Abused: Not on file  . Physically Abused: Not on file  . Sexually Abused: Not on file     Constitutional: Pt reports fatigue and headache. Denies  fever, malaise or abrupt weight changes.  HEENT: Pt reports nasal congestion. Denies eye pain, eye redness, ear pain, ringing in the ears, wax buildup, runny nose, bloody nose, or sore throat. Respiratory: Denies difficulty breathing, shortness of breath, cough or sputum production.   Cardiovascular: Denies chest pain, chest tightness, palpitations or swelling in the hands or feet.  Gastrointestinal: Denies abdominal pain, bloating, constipation, diarrhea or blood in the stool.   No other specific complaints in a complete review of systems (except as listed in HPI above).     Observations/Objective:   Wt Readings from Last 3 Encounters:  10/27/18 210 lb (95.3 kg)  08/25/17 210 lb (95.3 kg)  07/02/17 208 lb (94.3 kg)    General: Appears her stated age, obese, in NAD. HEENT: Head: normal shape and size. Nose: slight congestion noted; Throat: no hoarseness noted.  Pulmonary/Chest: Normal effort. No respiratory distress.   Neurological: Alert and oriented.   BMET    Component Value Date/Time   NA 138 10/27/2018 1558   K 4.3 10/27/2018 1558   CL 105 10/27/2018 1558   CO2 26 10/27/2018 1558   GLUCOSE 105 (H) 10/27/2018 1558   BUN 15 10/27/2018 1558   CREATININE 1.05 10/27/2018 1558   CREATININE 0.79 07/19/2015 1511   CALCIUM 8.9 10/27/2018 1558    Lipid Panel     Component Value Date/Time   CHOL 172 10/27/2018 1558   TRIG 135.0 10/27/2018 1558   HDL 40.30 10/27/2018 1558   CHOLHDL 4 10/27/2018 1558   VLDL 27.0 10/27/2018 1558   LDLCALC 104 (H) 10/27/2018 1558    CBC    Component Value Date/Time   WBC 6.4 10/27/2018 1558   RBC 4.54 10/27/2018 1558   HGB 10.9 (L) 10/27/2018 1558   HCT 34.5 (L) 10/27/2018 1558   PLT 278.0 10/27/2018 1558   MCV 76.1 (L) 10/27/2018 1558   MCH 23.2 (L) 07/19/2015 1511   MCHC 31.7 10/27/2018 1558   RDW 19.6 (H) 10/27/2018 1558   LYMPHSABS 1.9 10/27/2018 1558   MONOABS 0.6 10/27/2018 1558   EOSABS 0.2 10/27/2018 1558   BASOSABS 0.1  10/27/2018 1558    Hgb A1C Lab Results  Component Value Date   HGBA1C 5.9 10/27/2018        Assessment and Plan:  Fatigue, Nasal Congestion, Headache, Exposure to Covid 19::  Covid testing negative Advised to take OTC Zyrtec and to use Flonase PRN. Patient given a note for work to quarantine until 03/23/2019 which will be 14 days from exposure.  Return precautions discussed  Follow Up Instructions:    I discussed the assessment and treatment plan with the patient. The patient was provided  an opportunity to ask questions and all were answered. The patient agreed with the plan and demonstrated an understanding of the instructions.   The patient was advised to call back or seek an in-person evaluation if the symptoms worsen or if the condition fails to improve as anticipated.     Webb Silversmith, NP

## 2019-03-16 NOTE — Telephone Encounter (Signed)
Patient called about work letter she is needing She stated she looked on her my chart and did not see anything come through

## 2019-03-16 NOTE — Telephone Encounter (Signed)
It has been done, she should recheck her mychart

## 2019-03-17 IMAGING — DX DG LUMBAR SPINE COMPLETE 4+V
5 series · 5 of 5 positions shown · non-contrast
Comparison: Radiographs August 10, 2014.

CLINICAL DATA: Acute midline low back pain without sciatica.

EXAM:
LUMBAR SPINE - COMPLETE 4+ VIEW

[l-spine ap]
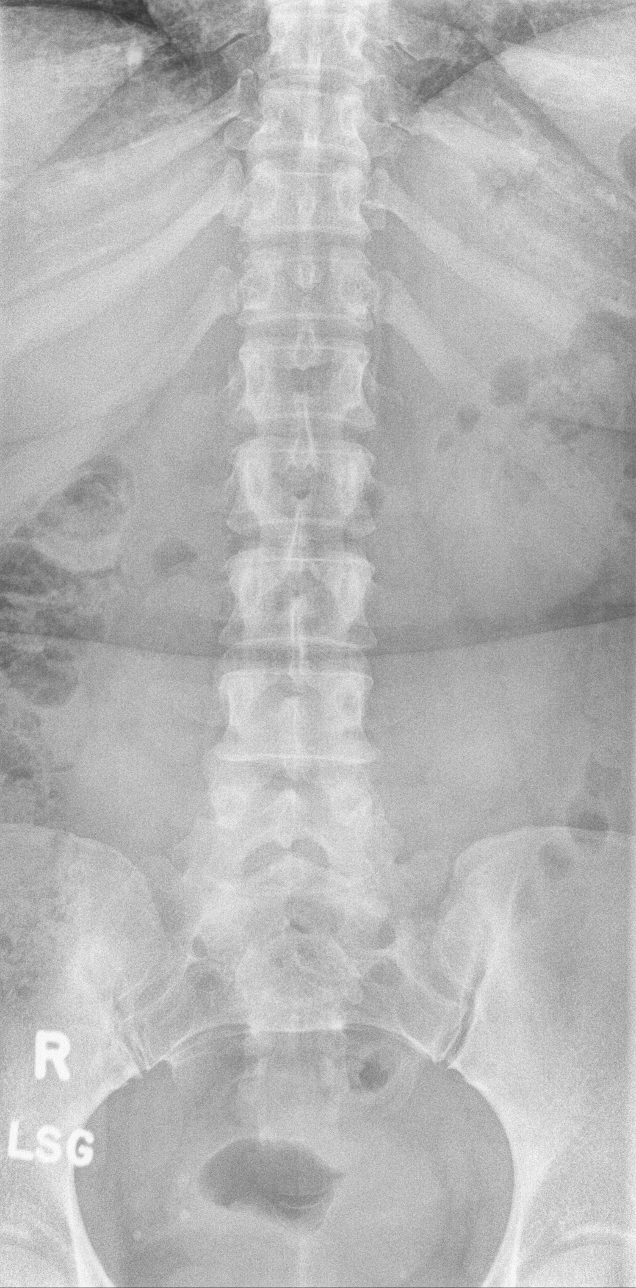

[l-spine obl (1 of 2)]
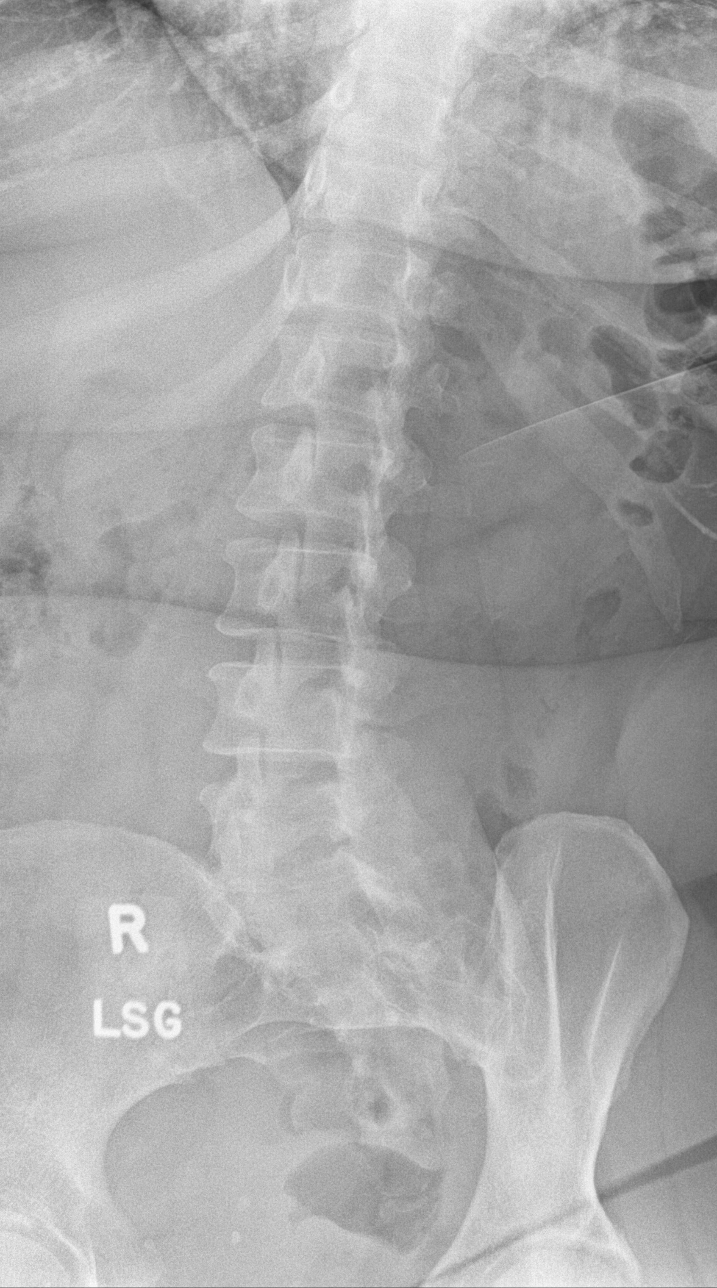

[l-spine obl (2 of 2)]
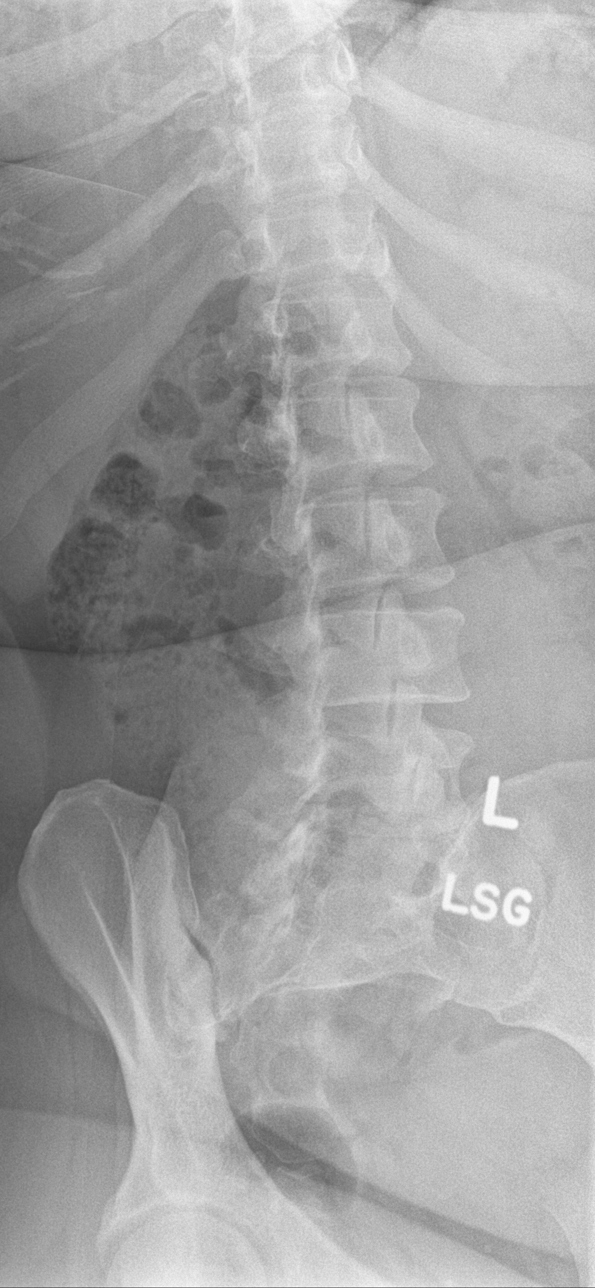

[l-spine lat]
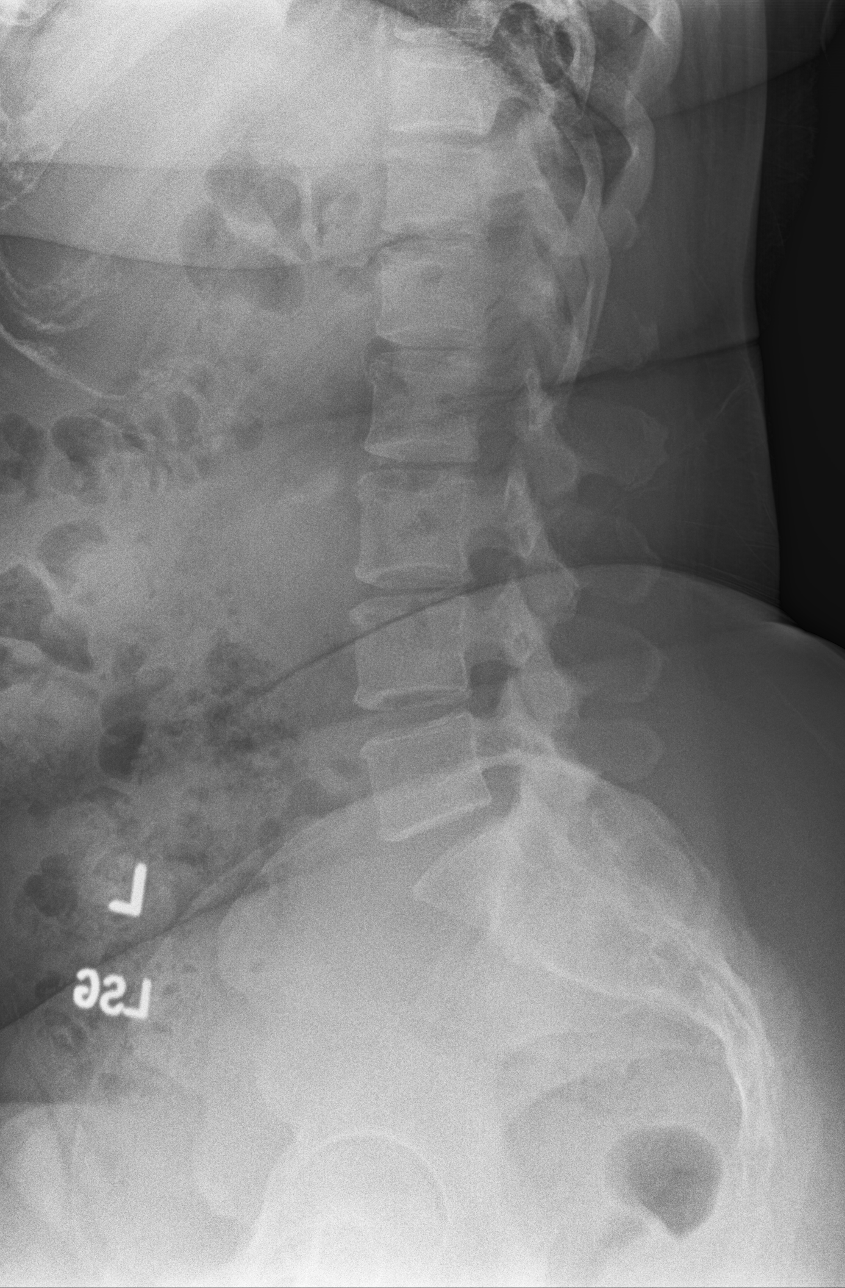

[l-spine l5/s1]
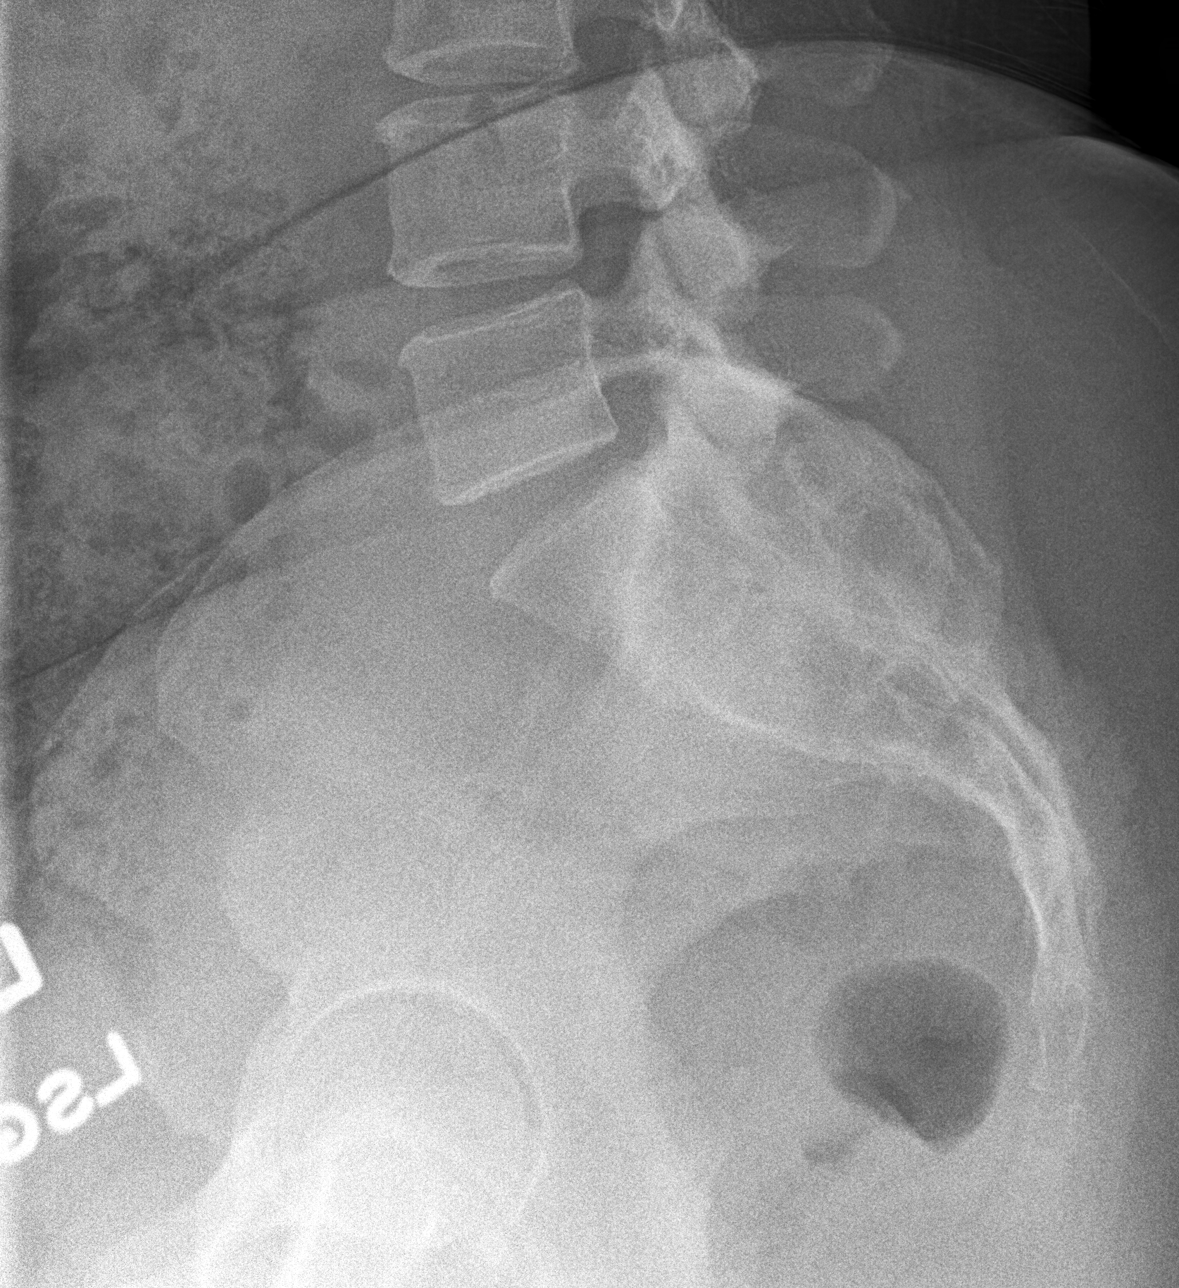

[5 of 5 positions shown; findings below may reference images not displayed]

FINDINGS: There is no evidence of lumbar spine fracture. Alignment is normal.
Intervertebral disc spaces are maintained.
IMPRESSION: Normal lumbar spine.

## 2019-04-11 ENCOUNTER — Encounter: Payer: Self-pay | Admitting: Internal Medicine

## 2019-04-11 ENCOUNTER — Ambulatory Visit: Payer: BC Managed Care – PPO | Admitting: Internal Medicine

## 2019-04-11 ENCOUNTER — Other Ambulatory Visit: Payer: Self-pay

## 2019-04-11 VITALS — BP 124/78 | HR 64 | Temp 97.8°F | Wt 210.0 lb

## 2019-04-11 DIAGNOSIS — R232 Flushing: Secondary | ICD-10-CM

## 2019-04-11 DIAGNOSIS — R102 Pelvic and perineal pain: Secondary | ICD-10-CM

## 2019-04-11 DIAGNOSIS — N912 Amenorrhea, unspecified: Secondary | ICD-10-CM

## 2019-04-11 DIAGNOSIS — R454 Irritability and anger: Secondary | ICD-10-CM

## 2019-04-11 DIAGNOSIS — R61 Generalized hyperhidrosis: Secondary | ICD-10-CM

## 2019-04-11 DIAGNOSIS — E039 Hypothyroidism, unspecified: Secondary | ICD-10-CM

## 2019-04-11 LAB — T4, FREE: Free T4: 0.89 ng/dL (ref 0.60–1.60)

## 2019-04-11 LAB — LUTEINIZING HORMONE: LH: 21.61 m[IU]/mL

## 2019-04-11 LAB — FOLLICLE STIMULATING HORMONE: FSH: 20.4 m[IU]/mL

## 2019-04-11 LAB — HCG, QUANTITATIVE, PREGNANCY: Quantitative HCG: 0.63 m[IU]/mL

## 2019-04-11 LAB — TSH: TSH: 3.6 u[IU]/mL (ref 0.35–4.50)

## 2019-04-11 NOTE — Patient Instructions (Signed)
Primary Amenorrhea  Primary amenorrhea is when a female has not started having periods by the age of 15 years. What are the causes? This condition may be caused by:  An abnormal chromosome that causes the ovaries not to work properly (common).  Malnutrition.  Low blood sugar (hypoglycemia).  Polycystic ovary syndrome.  Being born without a vagina, uterus, or ovaries.  Extreme obesity.  Cystic fibrosis.  Drastic weight loss.  Over-exercising that leads to a loss of body fat.  Pituitary gland tumor in the brain.  Long-term illnesses.  Cushing disease.  A thyroid disease, such as hypothyroidism or hyperthyroidism.  A part of the brain called the hypothalamus not working normally.  Premature ovarian failure. What are the signs or symptoms? The main symptom of this condition is not having had a period by age 15 years. Other symptoms include:  Discharge from the breasts.  Hot flashes.  Adult acne.  Facial or chest hair.  Headaches.  Impaired vision.  Recent excessive stress.  Changes in weight, diet, or exercise patterns. How is this diagnosed? This condition may be diagnosed based on:  Your medical history.  A physical exam.  Tests, such as: ? Blood tests. ? Urine tests. How is this treated? Treatment for this condition depends on the cause. For example, an absence of sex organs will require surgery to correct the problem. Others causes may respond when treated with medicine. Follow these instructions at home:  Maintain a healthy diet.  Maintain a healthy weight.  Exercise regularly but not excessively.  Take over-the-counter and prescription medicines only as told by your health care provider.  Keep all follow-up visits as told by your health care provider. This is important. Contact a health care provider if:  Your body is not developing at the level of your peers.  You have pelvic pain.  You gain an unusual amount of weight.  You have  an unusual amount of hair growth. Summary  Primary amenorrhea is when a female has not started having periods by the age of 15 years.  This condition has many causes, including abnormal ovaries, obesity, extreme weight loss.  Contact a health care provider if your body is not developing at the level of your peers. This information is not intended to replace advice given to you by your health care provider. Make sure you discuss any questions you have with your health care provider. Document Revised: 07/12/2018 Document Reviewed: 04/14/2016 Elsevier Patient Education  2020 [employer] Inc[employer]  

## 2019-04-11 NOTE — Progress Notes (Signed)
Subjective:    Patient ID: Carla Wheeler, female    DOB: 09-28-1975, 44 y.o.   MRN: [id number]  HPI  Pt presents to the clinic today with c/o 2 missed menstrual cycles. Her LMP was 01/2019. She has not had any spotting since that time. She reports normal menses prior to that. She reports associated fatigue, hot flashes, night sweats and irritability. She has had some pelvic cramping but denies nausea, breast tenderness etc. She is sexually active with her husband but reports she has had her tubes tied after her last pregnancy. She did not take a urine pregnancy test at home and has not tried anything OTC for this. She has a history of hypothyroidism but has been taking Levothyroxine as prescribed.   Review of Systems      Past Medical History:  Diagnosis Date  . Asthma   . Depression   . Thyroid disease     Current Outpatient Medications  Medication Sig Dispense Refill  . albuterol (PROVENTIL HFA;VENTOLIN HFA) 108 (90 Base) MCG/ACT inhaler TAKE 2 PUFFS BY MOUTH EVERY 6 HOURS AS NEEDED FOR WHEEZE OR SHORTNESS OF BREATH 8.5 Inhaler 5  . ferrous sulfate 325 (65 FE) MG tablet Take 325 mg by mouth daily with breakfast.    . levothyroxine (SYNTHROID) 125 MCG tablet TAKE 1 TABLET BY MOUTH EVERY DAY 30 tablet 8   No current facility-administered medications for this visit.    Allergies  Allergen Reactions  . Augmentin [Amoxicillin-Pot Clavulanate] Other (See Comments)    Muscle cramps    Family History  Problem Relation Age of Onset  . Cancer Mother        Breast  . Arthritis Father   . Heart disease Father   . Stroke Father   . Hypertension Father   . Diabetes Father   . Congestive Heart Failure Father   . Cancer Maternal Grandmother        Breast  . Hypertension Maternal Grandmother   . Hypertension Paternal Grandmother   . Learning disabilities Paternal Grandmother     Social History   Socioeconomic History  . Marital status: Married    Spouse name: Not on  file  . Number of children: Not on file  . Years of education: Not on file  . Highest education level: Not on file  Occupational History  . Not on file  Tobacco Use  . Smoking status: Former Smoker    Types: Cigars  . Smokeless tobacco: Never Used  Substance and Sexual Activity  . Alcohol use: Yes    Alcohol/week: 3.0 standard drinks    Types: 3 Glasses of wine per week    Comment: occasional  . Drug use: No  . Sexual activity: Yes  Other Topics Concern  . Not on file  Social History Narrative  . Not on file   Social Determinants of Health   Financial Resource Strain:   . Difficulty of Paying Living Expenses: Not on file  Food Insecurity:   . Worried About Charity fundraiser in the Last Year: Not on file  . Ran Out of Food in the Last Year: Not on file  Transportation Needs:   . Lack of Transportation (Medical): Not on file  . Lack of Transportation (Non-Medical): Not on file  Physical Activity:   . Days of Exercise per Week: Not on file  . Minutes of Exercise per Session: Not on file  Stress:   . Feeling of Stress : Not on file  Social Connections:   . Frequency of Communication with Friends and Family: Not on file  . Frequency of Social Gatherings with Friends and Family: Not on file  . Attends Religious Services: Not on file  . Active Member of Clubs or Organizations: Not on file  . Attends Archivist Meetings: Not on file  . Marital Status: Not on file  Intimate Partner Violence:   . Fear of Current or Ex-Partner: Not on file  . Emotionally Abused: Not on file  . Physically Abused: Not on file  . Sexually Abused: Not on file     Constitutional: Pt reports fatigue. Denies fever, malaise, headache or abrupt weight changes.  Respiratory: Denies difficulty breathing, shortness of breath, cough or sputum production.   Cardiovascular: Denies chest pain, chest tightness, palpitations or swelling in the hands or feet.  Gastrointestinal: Pt reports pelvic  cramping. Denies abdominal pain, bloating, constipation, diarrhea or blood in the stool.  GU: Pt reports missed period. Denies urgency, frequency, pain with urination, burning sensation, blood in urine, odor or discharge.  No other specific complaints in a complete review of systems (except as listed in HPI above).  Objective:   Physical Exam  BP 124/78   Pulse 64   Temp 97.8 F (36.6 C) (Temporal)   Wt 210 lb (95.3 kg)   LMP 01/28/2019   SpO2 98%   BMI 41.01 kg/m   Wt Readings from Last 3 Encounters:  10/27/18 210 lb (95.3 kg)  08/25/17 210 lb (95.3 kg)  07/02/17 208 lb (94.3 kg)    General: Appears her stated age, obese, in NAD. Cardiovascular: Normal rate and rhythm. Pulmonary/Chest: Normal effort and positive vesicular breath sounds. No respiratory distress. No wheezes, rales or ronchi noted.  Abdomen: Soft and nontender. Normal bowel sounds. No distention or masses noted.  Neurological: Alert and oriented.   BMET    Component Value Date/Time   NA 138 10/27/2018 1558   K 4.3 10/27/2018 1558   CL 105 10/27/2018 1558   CO2 26 10/27/2018 1558   GLUCOSE 105 (H) 10/27/2018 1558   BUN 15 10/27/2018 1558   CREATININE 1.05 10/27/2018 1558   CREATININE 0.79 07/19/2015 1511   CALCIUM 8.9 10/27/2018 1558    Lipid Panel     Component Value Date/Time   CHOL 172 10/27/2018 1558   TRIG 135.0 10/27/2018 1558   HDL 40.30 10/27/2018 1558   CHOLHDL 4 10/27/2018 1558   VLDL 27.0 10/27/2018 1558   LDLCALC 104 (H) 10/27/2018 1558    CBC    Component Value Date/Time   WBC 6.4 10/27/2018 1558   RBC 4.54 10/27/2018 1558   HGB 10.9 (L) 10/27/2018 1558   HCT 34.5 (L) 10/27/2018 1558   PLT 278.0 10/27/2018 1558   MCV 76.1 (L) 10/27/2018 1558   MCH 23.2 (L) 07/19/2015 1511   MCHC 31.7 10/27/2018 1558   RDW 19.6 (H) 10/27/2018 1558   LYMPHSABS 1.9 10/27/2018 1558   MONOABS 0.6 10/27/2018 1558   EOSABS 0.2 10/27/2018 1558   BASOSABS 0.1 10/27/2018 1558    Hgb A1C Lab  Results  Component Value Date   HGBA1C 5.9 10/27/2018           Assessment & Plan:   Amenorrhea, Pelvic Cramping, Hot Flashes, Night Sweats, Irritabilty:  DDX include perimenopause, pregnancy after tubal ligation vs pregnancy Low suspicion for tubal pregnancy in the abcense of no abdominal pain Will check TSH, Free T4, FSH, LH and serum quantitative HCG If labs normal, consider  pelvic/transvaginal ultrasound  Will follow up after labs, return precautions discussed  Webb Silversmith, NP This visit occurred during the SARS-CoV-2 public health emergency.  Safety protocols were in place, including screening questions prior to the visit, additional usage of staff PPE, and extensive cleaning of exam room while observing appropriate contact time as indicated for disinfecting solutions.

## 2019-07-12 ENCOUNTER — Encounter: Payer: Self-pay | Admitting: Internal Medicine

## 2019-07-12 ENCOUNTER — Other Ambulatory Visit: Payer: Self-pay

## 2019-07-12 ENCOUNTER — Ambulatory Visit: Payer: BC Managed Care – PPO | Admitting: Internal Medicine

## 2019-07-12 VITALS — BP 126/78 | HR 66 | Temp 98.3°F | Wt 210.0 lb

## 2019-07-12 DIAGNOSIS — R300 Dysuria: Secondary | ICD-10-CM | POA: Diagnosis not present

## 2019-07-12 DIAGNOSIS — R8290 Unspecified abnormal findings in urine: Secondary | ICD-10-CM

## 2019-07-12 DIAGNOSIS — R350 Frequency of micturition: Secondary | ICD-10-CM

## 2019-07-12 DIAGNOSIS — N898 Other specified noninflammatory disorders of vagina: Secondary | ICD-10-CM | POA: Diagnosis not present

## 2019-07-12 LAB — POC URINALSYSI DIPSTICK (AUTOMATED)
Bilirubin, UA: NEGATIVE
Glucose, UA: NEGATIVE
Ketones, UA: NEGATIVE
Leukocytes, UA: NEGATIVE
Nitrite, UA: NEGATIVE
Protein, UA: NEGATIVE
Spec Grav, UA: 1.025 (ref 1.010–1.025)
Urobilinogen, UA: 0.2 E.U./dL
pH, UA: 6 (ref 5.0–8.0)

## 2019-07-12 MED ORDER — PHENAZOPYRIDINE HCL 200 MG PO TABS: 200.0000 mg | ORAL_TABLET | Freq: Three times a day (TID) | ORAL | 0 refills | Status: DC | PRN

## 2019-07-12 NOTE — Patient Instructions (Signed)

## 2019-07-12 NOTE — Progress Notes (Signed)
HPI  Pt presents to the clinic today with c/o urinary frequency, dysuria and strong odor. This started 2 weeks ago. She has had some vaginal discharge requiring use of a panty liner. She denies urgency, blood in her urine, odor or irritation, fever, chills nausea or low back pain. She has not taken anything OTC for this. She started her menses 2 days ago.   Review of Systems  Past Medical History:  Diagnosis Date  . Asthma   . Depression   . Thyroid disease     Family History  Problem Relation Age of Onset  . Cancer Mother        Breast  . Arthritis Father   . Heart disease Father   . Stroke Father   . Hypertension Father   . Diabetes Father   . Congestive Heart Failure Father   . Cancer Maternal Grandmother        Breast  . Hypertension Maternal Grandmother   . Hypertension Paternal Grandmother   . Learning disabilities Paternal Grandmother     Social History   Socioeconomic History  . Marital status: Married    Spouse name: Not on file  . Number of children: Not on file  . Years of education: Not on file  . Highest education level: Not on file  Occupational History  . Not on file  Tobacco Use  . Smoking status: Former Smoker    Types: Cigars  . Smokeless tobacco: Never Used  Substance and Sexual Activity  . Alcohol use: Yes    Alcohol/week: 3.0 standard drinks    Types: 3 Glasses of wine per week    Comment: occasional  . Drug use: No  . Sexual activity: Yes  Other Topics Concern  . Not on file  Social History Narrative  . Not on file   Social Determinants of Health   Financial Resource Strain:   . Difficulty of Paying Living Expenses:   Food Insecurity:   . Worried About Charity fundraiser in the Last Year:   . Arboriculturist in the Last Year:   Transportation Needs:   . Film/video editor (Medical):   Marland Kitchen Lack of Transportation (Non-Medical):   Physical Activity:   . Days of Exercise per Week:   . Minutes of Exercise per Session:   Stress:    . Feeling of Stress :   Social Connections:   . Frequency of Communication with Friends and Family:   . Frequency of Social Gatherings with Friends and Family:   . Attends Religious Services:   . Active Member of Clubs or Organizations:   . Attends Archivist Meetings:   Marland Kitchen Marital Status:   Intimate Partner Violence:   . Fear of Current or Ex-Partner:   . Emotionally Abused:   Marland Kitchen Physically Abused:   . Sexually Abused:     Allergies  Allergen Reactions  . Augmentin [Amoxicillin-Pot Clavulanate] Other (See Comments)    Muscle cramps     Constitutional: Denies fever, malaise, fatigue, headache or abrupt weight changes.   GU: Pt reports frequency, dysuria, urine odor, vaginal discharge and incontinence. Denies urgency, burning sensation. Skin: Denies redness, rashes, lesions or ulcercations.   No other specific complaints in a complete review of systems (except as listed in HPI above).    Objective:   Physical Exam  BP 126/78   Pulse 66   Temp 98.3 F (36.8 C) (Temporal)   Wt 210 lb (95.3 kg)   LMP 07/10/2019  SpO2 98%   BMI 41.01 kg/m   Wt Readings from Last 3 Encounters:  04/11/19 210 lb (95.3 kg)  10/27/18 210 lb (95.3 kg)  08/25/17 210 lb (95.3 kg)    General: Appears her stated age, obese, in NAD. Cardiovascular: Normal rate and rhythm. S1,S2 noted.   Pulmonary/Chest: Normal effort and positive vesicular breath sounds. No respiratory distress. No wheezes, rales or ronchi noted.  Abdomen: Soft. Normal bowel sounds. No distention or masses noted.  Tender to palpation over the bladder area. No CVA tenderness.        Assessment & Plan:   Frequency, Dysuria, Urine Odor:  Urinalysis: 3+ blood Will send urine culture RX for Pyridium 200 mg TID prn Drink plenty of fluids  Vaginal Discharge:  Will send off wet prep  RTC as needed or if symptoms persist. Webb Silversmith, NP This visit occurred during the SARS-CoV-2 public health emergency.   Safety protocols were in place, including screening questions prior to the visit, additional usage of staff PPE, and extensive cleaning of exam room while observing appropriate contact time as indicated for disinfecting solutions.

## 2019-07-12 NOTE — Addendum Note (Signed)
Addended by: Lurlean Nanny on: 07/12/2019 05:10 PM   Modules accepted: Orders

## 2019-07-13 LAB — WET PREP BY MOLECULAR PROBE
Candida species: NOT DETECTED
Gardnerella vaginalis: NOT DETECTED
MICRO NUMBER:: 10544085
SPECIMEN QUALITY:: ADEQUATE
Trichomonas vaginosis: NOT DETECTED

## 2019-07-13 LAB — URINE CULTURE
MICRO NUMBER:: 10544088
SPECIMEN QUALITY:: ADEQUATE

## 2019-11-01 ENCOUNTER — Encounter: Payer: Self-pay | Admitting: Internal Medicine

## 2020-02-16 ENCOUNTER — Encounter: Payer: Self-pay | Admitting: Family Medicine

## 2020-02-17 ENCOUNTER — Telehealth (INDEPENDENT_AMBULATORY_CARE_PROVIDER_SITE_OTHER): Payer: Managed Care, Other (non HMO) | Admitting: Family Medicine

## 2020-02-17 ENCOUNTER — Encounter: Payer: Self-pay | Admitting: Family Medicine

## 2020-02-17 VITALS — Ht 60.0 in | Wt 210.0 lb

## 2020-02-17 DIAGNOSIS — J4521 Mild intermittent asthma with (acute) exacerbation: Secondary | ICD-10-CM | POA: Diagnosis not present

## 2020-02-17 DIAGNOSIS — Z20822 Contact with and (suspected) exposure to covid-19: Secondary | ICD-10-CM

## 2020-02-17 MED ORDER — GUAIFENESIN-CODEINE 100-10 MG/5ML PO SYRP: 5.0000 mL | ORAL_SOLUTION | Freq: Every evening | ORAL | 0 refills | Status: DC | PRN

## 2020-02-17 MED ORDER — PREDNISONE 20 MG PO TABS: ORAL_TABLET | ORAL | 0 refills | Status: DC

## 2020-02-17 MED ORDER — ALBUTEROL SULFATE HFA 108 (90 BASE) MCG/ACT IN AERS
INHALATION_SPRAY | RESPIRATORY_TRACT | 5 refills | Status: DC
Start: 2020-02-17 — End: 2022-09-07

## 2020-02-17 NOTE — Progress Notes (Signed)
VIRTUAL VISIT Due to national recommendations of social distancing due to [place] 19, a virtual visit is felt to be most appropriate for this patient at this time.   I connected with the patient on 02/17/20 at  9:00 AM EST by virtual telehealth platform and verified that I am speaking with the correct person using two identifiers.   I discussed the limitations, risks, security and privacy concerns of performing an evaluation and management service by  virtual telehealth platform and the availability of in person appointments. I also discussed with the patient that there may be a patient responsible charge related to this service. The patient expressed understanding and agreed to proceed.  Patient location: Home Provider Location: [facility] Participants: Eliezer Lofts and Murray Hodgkins   Chief Complaint  Patient presents with  . Cough    X 2 days not productive, body aches, fatigue low grade fever. Has not had covid test. Son tested positive 1/4 lives in the home.     History of Present Illness:  45 year old female patient with history of mild intermittent asthma  Presents with new onset  Chills, fatigue and cough on 02/14/2020.  Progressed to  productive cough, nasal congestion.  Has fever and chills, 99.47F low grade, last night was last time.   Hears rattling and wheezing at night. Thick yellow brown mucus.  Mild shortness of breath better if sitting up   Cough keeping up at night.   H as no albuterol.  Has tried ibuprofen, mucinex cold puls, theraflu.  COVID 19 screen COVID testing:: None, cannot find testing. COVID vaccine: x 3, booster on 02/13/20 COVID exposure: Son  Positive, on 1/4, household contact.   The importance of social distancing was discussed today.   Review of Systems  Constitutional: Positive for chills, fever and malaise/fatigue.  HENT: Positive for congestion.   Respiratory: Positive for cough, sputum production, shortness of breath and wheezing.        Past Medical History:  Diagnosis Date  . Asthma   . Depression   . Thyroid disease     reports that she has quit smoking. Her smoking use included cigars. She has never used smokeless tobacco. She reports current alcohol use of about 3.0 standard drinks of alcohol per week. She reports that she does not use drugs.   Current Outpatient Medications:  .  albuterol (PROVENTIL HFA;VENTOLIN HFA) 108 (90 Base) MCG/ACT inhaler, TAKE 2 PUFFS BY MOUTH EVERY 6 HOURS AS NEEDED FOR WHEEZE OR SHORTNESS OF BREATH, Disp: 8.5 Inhaler, Rfl: 5 .  docusate sodium (COLACE) 100 MG capsule, Take 100 mg by mouth daily., Disp: , Rfl:  .  ferrous sulfate 325 (65 FE) MG tablet, Take 325 mg by mouth daily with breakfast., Disp: , Rfl:  .  levothyroxine (SYNTHROID) 125 MCG tablet, TAKE 1 TABLET BY MOUTH EVERY DAY, Disp: 30 tablet, Rfl: 8   Observations/Objective: Height 5' (1.524 m), weight 210 lb (95.3 kg).  Physical Exam  Physical Exam Constitutional:      General: The patient is not in acute distress. Pulmonary:     Effort: Pulmonary effort is normal. No respiratory distress.  Neurological:     Mental Status: The patient is alert and oriented to person, place, and time.  Psychiatric:        Mood and Affect: Mood normal.        Behavior: Behavior normal.   Assessment and Plan    Problem List Items Addressed This Visit  Close exposure to COVID-19 virus - Primary   Mild intermittent asthma with acute exacerbation    Likely COVID19  infection vs other viral infection. No clear sign of bacterial infection at this time.  Recommend testing .. info on how to obtain testing provided through [facility] [facility].  No red flags/need for ER visit or in-person exam at respiratory clinic at this time.  Start prednsione taper, albuterol prn for asthma exacerbation.  Use cough suppressant at night prn cough.    Pt moderate to high risk for COVID complications given asthma,obesity and age... likely candidate for COVID  outpatient treatment if COVID test positive. If SOB begins symptoms worsening.. have low threshold for in-person exam, if severe shortness of breath ER visit recommended.  Can monitor Oxygen saturation at home with home monitor if able to obtain.  Go to ER if O2 sat < 90% on room air.   Reviewed home care and provided information through [facility].  Recommended isolation until test returns. If returns positive 10 days quarantine recommended.  Provided info about prevention of spread of COVID 19.       Relevant Medications   albuterol (VENTOLIN HFA) 108 (90 Base) MCG/ACT inhaler   predniSONE (DELTASONE) 20 MG tablet      Meds ordered this encounter  Medications  . albuterol (VENTOLIN HFA) 108 (90 Base) MCG/ACT inhaler    Sig: TAKE 2 PUFFS BY MOUTH EVERY 6 HOURS AS NEEDED FOR WHEEZE OR SHORTNESS OF BREATH    Dispense:  8.5 each    Refill:  5  . predniSONE (DELTASONE) 20 MG tablet    Sig: 3 tabs by mouth daily x 3 days, then 2 tabs by mouth daily x 2 days then 1 tab by mouth daily x 2 days    Dispense:  15 tablet    Refill:  0  . guaiFENesin-codeine (ROBITUSSIN AC) 100-10 MG/5ML syrup    Sig: Take 5-10 mLs by mouth at bedtime as needed for cough.    Dispense:  180 mL    Refill:  0    I discussed the assessment and treatment plan with the patient. The patient was provided an opportunity to ask questions and all were answered. The patient agreed with the plan and demonstrated an understanding of the instructions.   The patient was advised to call back or seek an in-person evaluation if the symptoms worsen or if the condition fails to improve as anticipated.     Eliezer Lofts, MD

## 2020-02-17 NOTE — Assessment & Plan Note (Signed)
Likely COVID19  infection vs other viral infection. No clear sign of bacterial infection at this time.  Recommend testing .. info on how to obtain testing provided through [place].  No red flags/need for ER visit or in-person exam at respiratory clinic at this time.  Start prednsione taper, albuterol prn for asthma exacerbation.  Use cough suppressant at night prn cough.    Pt moderate to high risk for COVID complications given asthma,obesity and age... likely candidate for COVID outpatient treatment if COVID test positive. If SOB begins symptoms worsening.. have low threshold for in-person exam, if severe shortness of breath ER visit recommended.  Can monitor Oxygen saturation at home with home monitor if able to obtain.  Go to ER if O2 sat < 90% on room air.   Reviewed home care and provided information through [facility].  Recommended isolation until test returns. If returns positive 10 days quarantine recommended.  Provided info about prevention of spread of COVID 19.

## 2020-02-17 NOTE — Patient Instructions (Signed)
COVID19 Testing:  1. [facility]:  [facility], [place], [place], [address] [email or web address]  2. [facility]: [email or web address] or call [phone]  3.[facility]:  [email or web address] or [phone]   Likely COVID19  infection vs other viral infection. No clear sign of bacterial infection at this time.  Recommend testing .. info on how to obtain testing provided through [facility].  No red flags/need for ER visit or in-person exam at respiratory clinic at this time.  Start prednsione taper, albuterol prn for asthma exacerbation.  use cough suppressant at night prn cough.    Pt moderate to high risk for COVID complications given asthma,obesity and age. If SOB begins symptoms worsening.. have low threshold for in-person exam, if severe shortness of breath ER visit recommended.  Can monitor Oxygen saturation at home with home monitor if able to obtain.  Go to ER if O2 sat < 90% on room air.     Person Under Monitoring Name: Carla Wheeler  Location: [address] [place] [address]   Infection Prevention Recommendations for Individuals Confirmed to have, or Being Evaluated for, 2019 Novel Coronavirus (COVID-19) Infection Who Receive Care at Home  Individuals who are confirmed to have, or are being evaluated for, COVID-19 should follow the prevention steps below until a healthcare provider or local or state health department says they can return to normal activities.  Stay home except to get medical care You should restrict activities outside your home, except for getting medical care. Do not go to work, school, or public areas, and do not use public transportation or taxis.  Call ahead before visiting your doctor Before your medical appointment, call the healthcare provider and tell them that you have, or are being evaluated for, COVID-19  infection. This will help the healthcare provider's office take steps to keep other people from getting infected. Ask your healthcare provider to call the local or state health department.  Monitor your symptoms Seek prompt medical attention if your illness is worsening (e.g., difficulty breathing). Before going to your medical appointment, call the healthcare provider and tell them that you have, or are being evaluated for, COVID-19 infection. Ask your healthcare provider to call the local or state health department.  Wear a facemask You should wear a facemask that covers your nose and mouth when you are in the same room with other people and when you visit a healthcare provider. People who live with or visit you should also wear a facemask while they are in the same room with you.  Separate yourself from other people in your home As much as possible, you should stay in a different room from other people in your home. Also, you should use a separate bathroom, if available.  Avoid sharing household items You should not share dishes, drinking glasses, cups, eating utensils, towels, bedding, or other items with other people in your home. After using these items, you should wash them thoroughly with soap and water.  Cover your coughs and sneezes Cover your mouth and nose with a tissue when you cough or sneeze, or you can cough or sneeze into your sleeve. Throw used tissues in a lined trash can, and immediately wash your hands with soap and water for at least 20 seconds or use an alcohol-based hand rub.  Wash your [facility] your hands often and thoroughly with soap and water for at least 20 seconds. You can use an alcohol-based hand sanitizer if soap and water are  not available and if your hands are not visibly dirty. Avoid touching your eyes, nose, and mouth with unwashed hands.   Prevention Steps for Caregivers and Household Members of Individuals Confirmed to have, or Being Evaluated  for, COVID-19 Infection Being Cared for in the Home  If you live with, or provide care at home for, a person confirmed to have, or being evaluated for, COVID-19 infection please follow these guidelines to prevent infection:  Follow healthcare provider's instructions Make sure that you understand and can help the patient follow any healthcare provider instructions for all care.  Provide for the patient's basic needs You should help the patient with basic needs in the home and provide support for getting groceries, prescriptions, and other personal needs.  Monitor the patient's symptoms If they are getting sicker, call his or her medical provider and tell them that the patient has, or is being evaluated for, COVID-19 infection. This will help the healthcare provider's office take steps to keep other people from getting infected. Ask the healthcare provider to call the local or state health department.  Limit the number of people who have contact with the patient  If possible, have only one caregiver for the patient.  Other household members should stay in another home or place of residence. If this is not possible, they should stay  in another room, or be separated from the patient as much as possible. Use a separate bathroom, if available.  Restrict visitors who do not have an essential need to be in the home.  Keep older adults, very young children, and other sick people away from the patient Keep older adults, very young children, and those who have compromised immune systems or chronic health conditions away from the patient. This includes people with chronic heart, lung, or kidney conditions, diabetes, and cancer.  Ensure good ventilation Make sure that shared spaces in the home have good air flow, such as from an air conditioner or an opened window, weather permitting.  Wash your hands often  Wash your hands often and thoroughly with soap and water for at least 20 seconds. You  can use an alcohol based hand sanitizer if soap and water are not available and if your hands are not visibly dirty.  Avoid touching your eyes, nose, and mouth with unwashed hands.  Use disposable paper towels to dry your hands. If not available, use dedicated cloth towels and replace them when they become wet.  Wear a facemask and gloves  Wear a disposable facemask at all times in the room and gloves when you touch or have contact with the patient's blood, body fluids, and/or secretions or excretions, such as sweat, saliva, sputum, nasal mucus, vomit, urine, or feces.  Ensure the mask fits over your nose and mouth tightly, and do not touch it during use.  Throw out disposable facemasks and gloves after using them. Do not reuse.  Wash your hands immediately after removing your facemask and gloves.  If your personal clothing becomes contaminated, carefully remove clothing and launder. Wash your hands after handling contaminated clothing.  Place all used disposable facemasks, gloves, and other waste in a lined container before disposing them with other household waste.  Remove gloves and wash your hands immediately after handling these items.  Do not share dishes, glasses, or other household items with the patient  Avoid sharing household items. You should not share dishes, drinking glasses, cups, eating utensils, towels, bedding, or other items with a patient who is confirmed to  have, or being evaluated for, COVID-19 infection.  After the person uses these items, you should wash them thoroughly with soap and water.  Wash laundry thoroughly  Immediately remove and wash clothes or bedding that have blood, body fluids, and/or secretions or excretions, such as sweat, saliva, sputum, nasal mucus, vomit, urine, or feces, on them.  Wear gloves when handling laundry from the patient.  Read and follow directions on labels of laundry or clothing items and detergent. In general, wash and dry with  the warmest temperatures recommended on the label.  Clean all areas the individual has used often  Clean all touchable surfaces, such as counters, tabletops, doorknobs, bathroom fixtures, toilets, phones, keyboards, tablets, and bedside tables, every day. Also, clean any surfaces that may have blood, body fluids, and/or secretions or excretions on them.  Wear gloves when cleaning surfaces the patient has come in contact with.  Use a diluted bleach solution (e.g., dilute bleach with 1 part bleach and 10 parts water) or a household disinfectant with a label that says EPA-registered for coronaviruses. To make a bleach solution at home, add 1 tablespoon of bleach to 1 quart (4 cups) of water. For a larger supply, add  cup of bleach to 1 gallon (16 cups) of water.  Read labels of cleaning products and follow recommendations provided on product labels. Labels contain instructions for safe and effective use of the cleaning product including precautions you should take when applying the product, such as wearing gloves or eye protection and making sure you have good ventilation during use of the product.  Remove gloves and wash hands immediately after cleaning.  Monitor yourself for signs and symptoms of illness Caregivers and household members are considered close contacts, should monitor their health, and will be asked to limit movement outside of the home to the extent possible. Follow the monitoring steps for close contacts listed on the symptom monitoring form.   ? If you have additional questions, contact your local health department or call the epidemiologist on call at [phone] (available 24/7). ? This guidance is subject to change. For the most up-to-date guidance from [facility], please refer to their website: [email or web address]

## 2020-02-18 ENCOUNTER — Emergency Department (HOSPITAL_COMMUNITY)
Admission: EM | Admit: 2020-02-18 | Discharge: 2020-02-19 | Disposition: A | Discharge status: Home / Self Care | Payer: Managed Care, Other (non HMO) | Attending: Emergency Medicine | Admitting: Emergency Medicine

## 2020-02-18 ENCOUNTER — Encounter (HOSPITAL_COMMUNITY): Payer: Self-pay

## 2020-02-18 ENCOUNTER — Emergency Department (HOSPITAL_COMMUNITY): Payer: Managed Care, Other (non HMO)

## 2020-02-18 ENCOUNTER — Other Ambulatory Visit: Payer: Self-pay

## 2020-02-18 DIAGNOSIS — Z87891 Personal history of nicotine dependence: Secondary | ICD-10-CM | POA: Insufficient documentation

## 2020-02-18 DIAGNOSIS — U071 COVID-19: Secondary | ICD-10-CM | POA: Diagnosis not present

## 2020-02-18 DIAGNOSIS — R059 Cough, unspecified: Secondary | ICD-10-CM | POA: Diagnosis present

## 2020-02-18 DIAGNOSIS — Z79899 Other long term (current) drug therapy: Secondary | ICD-10-CM | POA: Insufficient documentation

## 2020-02-18 DIAGNOSIS — J4521 Mild intermittent asthma with (acute) exacerbation: Secondary | ICD-10-CM | POA: Insufficient documentation

## 2020-02-18 DIAGNOSIS — E039 Hypothyroidism, unspecified: Secondary | ICD-10-CM | POA: Diagnosis not present

## 2020-02-18 LAB — CBC WITH DIFFERENTIAL/PLATELET
Abs Immature Granulocytes: 0.02 10*3/uL (ref 0.00–0.07)
Basophils Absolute: 0 10*3/uL (ref 0.0–0.1)
Basophils Relative: 0 %
Eosinophils Absolute: 0 10*3/uL (ref 0.0–0.5)
Eosinophils Relative: 0 %
HCT: 33.8 % — ABNORMAL LOW (ref 36.0–46.0)
Hemoglobin: 9.9 g/dL — ABNORMAL LOW (ref 12.0–15.0)
Immature Granulocytes: 0 %
Lymphocytes Relative: 12 %
Lymphs Abs: 0.7 10*3/uL (ref 0.7–4.0)
MCH: 22.7 pg — ABNORMAL LOW (ref 26.0–34.0)
MCHC: 29.3 g/dL — ABNORMAL LOW (ref 30.0–36.0)
MCV: 77.3 fL — ABNORMAL LOW (ref 80.0–100.0)
Monocytes Absolute: 0.5 10*3/uL (ref 0.1–1.0)
Monocytes Relative: 9 %
Neutro Abs: 4.5 10*3/uL (ref 1.7–7.7)
Neutrophils Relative %: 79 %
Platelets: 228 10*3/uL (ref 150–400)
RBC: 4.37 MIL/uL (ref 3.87–5.11)
RDW: 17.3 % — ABNORMAL HIGH (ref 11.5–15.5)
WBC: 5.7 10*3/uL (ref 4.0–10.5)
nRBC: 0 % (ref 0.0–0.2)

## 2020-02-18 LAB — BASIC METABOLIC PANEL
Anion gap: 11 (ref 5–15)
BUN: 10 mg/dL (ref 6–20)
CO2: 21 mmol/L — ABNORMAL LOW (ref 22–32)
Calcium: 8.4 mg/dL — ABNORMAL LOW (ref 8.9–10.3)
Chloride: 103 mmol/L (ref 98–111)
Creatinine, Ser: 0.74 mg/dL (ref 0.44–1.00)
GFR, Estimated: >60 mL/min (ref >60)
Glucose, Bld: 136 mg/dL — ABNORMAL HIGH (ref 70–99)
Potassium: 4 mmol/L (ref 3.5–5.1)
Sodium: 135 mmol/L (ref 135–145)

## 2020-02-18 LAB — RESP PANEL BY RT-PCR (FLU A&B, COVID) ARPGX2
Influenza A by PCR: NEGATIVE
Influenza B by PCR: NEGATIVE
SARS Coronavirus 2 by RT PCR: POSITIVE — AB

## 2020-02-18 LAB — TROPONIN I (HIGH SENSITIVITY): Troponin I (High Sensitivity): 3 ng/L (ref <18)

## 2020-02-18 MED ORDER — HYDROCOD POLST-CPM POLST ER 10-8 MG/5ML PO SUER
5.0000 mL | Freq: Once | ORAL | Status: AC
  Administered 2020-02-18: 5 mL via ORAL
  Filled 2020-02-18: qty 5

## 2020-02-18 NOTE — ED Notes (Signed)
Pt ambulated in room well. Oxygen saturation stayed about 95% throughout activity. HR maintained at 75-80. Pt complains of not being able to get breaths in well. PA made aware.

## 2020-02-18 NOTE — ED Triage Notes (Signed)
Patient complains if 4 days of cough and chest tightness. Was prescribed steroids and cough medicine yesterday through virtual visit. Patient alert and oriented, NAD. Has been vaccinated

## 2020-02-18 NOTE — ED Notes (Addendum)
Updated pt on wait for treatment room.  She is sitting in the COVID + waiting area.  States she saw her COVID results on MyChart.

## 2020-02-18 NOTE — ED Provider Notes (Signed)
[facility]ERGENC[facility] DEPARTMENT Provider Note   CSN: [id number] Arrival date & time: 02/18/20  1000     History No chief complaint on file.   Carla Wheeler is a 45 y.o. female history of asthma, hypothyroidism, depression.  Patient presents today with 4-day history of cough, fatigue, rhinorrhea, chest pain.  Symptoms gradual in onset, initially mild have since worsened.  She reports symptoms began 02/14/2020.  She reports that she received COVID-19 vaccinations last year had a booster shot on 02/13/2020.  Patient reports a constant nonproductive cough with associated chest tightness and shortness of breath.  She reports body aches as mild constant aching nonradiating no clear aggravating or alleviating factors.  Patient reports feeling of her cough getting "caught in her throat" causing her to have a choking sensation.  Reports feeling warm at home has not measured a fever.  Denies headache, vision changes, neck stiffness, sore throat, hemoptysis, abdominal pain, nausea/vomiting, diarrhea, history of blood clot, recent surgery/immobilization, exogenous hormone use, extremity swelling/color change, history of cancer or any additional concerns. HPI     Past Medical History:  Diagnosis Date  . Asthma   . Depression   . Thyroid disease     Patient Active Problem List   Diagnosis Date Noted  . Close exposure to COVID-19 virus 02/17/2020  . Anemia, iron deficiency 07/19/2015  . Mild intermittent asthma with acute exacerbation 07/19/2015  . Hypothyroidism 07/21/2013    Past Surgical History:  Procedure Laterality Date  . CESAREAN SECTION       OB History   No obstetric history on file.     Family History  Problem Relation Age of Onset  . Cancer Mother        Breast  . Arthritis Father   . Heart disease Father   . Stroke Father   . Hypertension Father   . Diabetes Father   . Congestive Heart Failure Father   . Cancer Maternal Grandmother         Breast  . Hypertension Maternal Grandmother   . Hypertension Paternal Grandmother   . Learning disabilities Paternal Grandmother     Social History   Tobacco Use  . Smoking status: Former Smoker    Types: Cigars  . Smokeless tobacco: Never Used  Substance Use Topics  . Alcohol use: Yes    Alcohol/week: 3.0 standard drinks    Types: 3 Glasses of wine per week    Comment: occasional  . Drug use: No    Home Medications Prior to Admission medications   Medication Sig Start Date End Date Taking? Authorizing Provider  benzonatate (TESSALON) 100 MG capsule Take 1 capsule (100 mg total) by mouth every 8 (eight) hours. 02/19/20  Yes Nuala Alpha A, PA-C  albuterol (VENTOLIN HFA) 108 (90 Base) MCG/ACT inhaler TAKE 2 PUFFS BY MOUTH EVERY 6 HOURS AS NEEDED FOR WHEEZE OR SHORTNESS OF BREATH 02/17/20   Bedsole, Amy E, MD  docusate sodium (COLACE) 100 MG capsule Take 100 mg by mouth daily.    [provider]  ferrous sulfate 325 (65 FE) MG tablet Take 325 mg by mouth daily with breakfast.    [provider]  guaiFENesin-codeine (ROBITUSSIN AC) 100-10 MG/5ML syrup Take 5-10 mLs by mouth at bedtime as needed for cough. 02/17/20   Jinny Sanders, MD  levothyroxine (SYNTHROID) 125 MCG tablet TAKE 1 TABLET BY MOUTH EVERY DAY 11/28/18   Jearld Fenton, NP  predniSONE (DELTASONE) 20 MG tablet 3 tabs by mouth  daily x 3 days, then 2 tabs by mouth daily x 2 days then 1 tab by mouth daily x 2 days 02/17/20   Jinny Sanders, MD    Allergies    Augmentin [amoxicillin-pot clavulanate] and Hydrogen peroxide  Review of Systems   Review of Systems Ten systems are reviewed and are negative for acute change except as noted in the HPI  Physical Exam Updated Vital Signs BP (!) 147/63 (BP Location: Right Arm)   Pulse 78   Temp 98.8 F (37.1 C) (Oral)   Resp (!) 21   SpO2 97%   Physical Exam Constitutional:      General: She is not in acute distress.    Appearance: Normal appearance. She  is well-developed. She is not ill-appearing or diaphoretic.  HENT:     Head: Normocephalic and atraumatic.     Jaw: There is normal jaw occlusion.     Right Ear: External ear normal.     Left Ear: External ear normal.     Nose: Rhinorrhea present. Rhinorrhea is clear.     Mouth/Throat:     Mouth: Mucous membranes are moist.     Pharynx: Oropharynx is clear.     Comments: Mild posterior oropharynx cobblestoning, no exudate or significant swelling.  Postnasal drip present.  The patient has normal phonation and is in control of secretions. No stridor.  Midline uvula without edema. Soft palate rises symmetrically. No tonsillar erythema, swelling or exudates. Tongue protrusion is normal, floor of mouth is soft. No trismus. No creptius on neck palpation. No gingival erythema or fluctuance noted. Mucus membranes moist. Eyes:     General: Vision grossly intact. Gaze aligned appropriately.     Extraocular Movements: Extraocular movements intact.     Conjunctiva/sclera: Conjunctivae normal.     Pupils: Pupils are equal, round, and reactive to light.  Neck:     Trachea: Trachea and phonation normal. No tracheal tenderness or tracheal deviation.     Meningeal: Brudzinski's sign absent.  Cardiovascular:     Rate and Rhythm: Normal rate and regular rhythm.  Pulmonary:     Effort: Pulmonary effort is normal. No accessory muscle usage or respiratory distress.     Breath sounds: Normal breath sounds and air entry.  Abdominal:     General: There is no distension.     Palpations: Abdomen is soft.     Tenderness: There is no abdominal tenderness. There is no guarding or rebound.  Musculoskeletal:        General: Normal range of motion.     Cervical back: Normal range of motion and neck supple.     Right lower leg: No edema.     Left lower leg: No edema.  Skin:    General: Skin is warm and dry.  Neurological:     Mental Status: She is alert.     GCS: GCS eye subscore is 4. GCS verbal subscore is 5.  GCS motor subscore is 6.     Comments: Speech is clear and goal oriented, follows commands Major Cranial nerves without deficit, no facial droop Moves extremities without ataxia, coordination intact  Psychiatric:        Behavior: Behavior normal.     ED Results / Procedures / Treatments   Labs (all labs ordered are listed, but only abnormal results are displayed) Labs Reviewed  RESP PANEL BY RT-PCR (FLU A&B, COVID) ARPGX2 - Abnormal; Notable for the following components:      Result Value   SARS  Coronavirus 2 by RT PCR POSITIVE (*)    All other components within normal limits  CBC WITH DIFFERENTIAL/PLATELET - Abnormal; Notable for the following components:   Hemoglobin 9.9 (*)    HCT 33.8 (*)    MCV 77.3 (*)    MCH 22.7 (*)    MCHC 29.3 (*)    RDW 17.3 (*)    All other components within normal limits  BASIC METABOLIC PANEL - Abnormal; Notable for the following components:   CO2 21 (*)    Glucose, Bld 136 (*)    Calcium 8.4 (*)    All other components within normal limits  GROUP A STREP BY PCR  I-STAT BETA HCG BLOOD, ED ([facility], [facility], [facility] ONLY)  TROPONIN I (HIGH SENSITIVITY)    EKG EKG Interpretation  Date/Time:  Sunday February 18 2020 10:35:01 EST Ventricular Rate:  72 PR Interval:  156 QRS Duration: 74 QT Interval:  362 QTC Calculation: 396 R Axis:   44 Text Interpretation: Normal sinus rhythm Cannot rule out Anterior infarct , age undetermined Abnormal ECG No prior ECG for comparison. No STEMI Confirmed by Antony Blackbird [phone]) on 02/18/2020 7:12:58 PM   Radiology DG Chest 1 View  Result Date: 02/18/2020 CLINICAL DATA:  Cough EXAM: CHEST  1 VIEW COMPARISON:  None. FINDINGS: The cardiomediastinal silhouette is mildly enlarged in contour.Prominent azygos contour. No pleural effusion. No pneumothorax. Diffuse interstitial prominence. Visualized abdomen is unremarkable. Globular calcific density overlying the RIGHT proximal humerus may reflect calcium hydroxyapatite  deposition. IMPRESSION: Diffuse interstitial prominence with differential considerations including pulmonary edema or atypical/viral pneumonia (including COVID 19 infection). Electronically Signed   By: Valentino Saxon MD   On: 02/18/2020 11:05    Procedures Procedures (including critical care time)  Medications Ordered in ED Medications  chlorpheniramine-HYDROcodone (TUSSIONEX) 10-8 MG/5ML suspension 5 mL (5 mLs Oral Given 02/18/20 2033)    ED Course  I have reviewed the triage vital signs and the nursing notes.  Pertinent labs & imaging results that were available during my care of the patient were reviewed by me and considered in my medical decision making (see chart for details).    MDM Rules/Calculators/A&P                         Additional history obtained from: 1. Nursing notes from this visit. 2. Review of electronic medical records. ------------------------------- 45 year old female presented for 4 days of URI symptoms with rhinorrhea, cough, congestion, chest pain with coughing and postnasal drip.  On exam she is well-appearing and in no acute distress.  She has been waiting multiple hours in the lobby prior to initial evaluation.  Covid test in triage is positive which explains patient's symptoms today.  Chest x-ray reviewed shows evidence of multifocal viral pneumonia due to COVID-19.  Patient does not appear fluid overloaded and does not have a history of heart failure doubt pulmonary edema as cause of chest x-ray abnormalities.  EKG reviewed with Dr. Lovena Le shows no emergent abnormalities.  Given patient's complaint of chest pain cardiac enzymes were added to work-up.  High-sensitivity troponin was negative, given multiple days of symptoms no indication for a second troponin.  BMP shows no emergent electrolyte derangement, AKI or gap.  CBC shows no leukocytosis to suggest bacterial infection, shows anemia of 9.9 which is similar to prior.  Patient low risk by Wells criteria and  PERC negative.  Work-up is reassuring low suspicion for ACS, PE, dissection or other emergent pathologies at  this time. - Patient was given Tussionex for her cough and reported some improvement.  She was then ambulated by nursing staff without hypoxia or difficulty on room air.  She is tolerating p.o. here without difficulty.  I then reevaluated the patient for discharge, she is resting comfortably in bed no acute distress.  She reports that she feels her voice is more hoarse now and she is concerned that something may be caught in her throat, she points towards her cricoid cartilage there is no overlying skin changes or swelling only exam.  Patient is very anxious today, I discussed with patient that this is possibly postnasal drip and mucus causing foreign body sensation in her cough, patient would like further evaluation.  Risks versus benefits of CT imaging were discussed with the patient and she is agreeable to CT soft tissue neck for further evaluation.  I feel CT imaging is reasonable at this time given patient's new hoarseness of her voice and foreign body sensation, will rule out epiglottitis versus other emergent process.  Discussed with Dr. Wyvonnia Dusky. - Patient awaiting CT soft tissue neck.  Care handoff given to Quincy Carnes, PA-C at shift change.  Plan of care is to follow-up on pending CT pending no emergent finding anticipate discharge.  I have referred patient to monoclonal antibody infusion center.  Carla Wheeler was evaluated in Emergency Department on 02/19/2020 for the symptoms described in the history of present illness. She was evaluated in the context of the global COVID-19 pandemic, which necessitated consideration that the patient might be at risk for infection with the SARS-CoV-2 virus that causes COVID-19. Institutional protocols and algorithms that pertain to the evaluation of patients at risk for COVID-19 are in a state of rapid change based on information released by regulatory  bodies including the CDC and federal and state organizations. These policies and algorithms were followed during the patient's care in the ED.  Note: Portions of this report may have been transcribed using voice recognition software. Every effort was made to ensure accuracy; however, inadvertent computerized transcription errors may still be present. Final Clinical Impression(s) / ED Diagnoses Final diagnoses:  COVID-19 virus infection    Rx / DC Orders ED Discharge Orders         Ordered    benzonatate (TESSALON) 100 MG capsule  Every 8 hours        02/19/20 0010           Gari Crown 02/19/20 0013    Ezequiel Essex, MD 02/19/20 [phone]

## 2020-02-19 ENCOUNTER — Telehealth: Payer: Self-pay

## 2020-02-19 ENCOUNTER — Emergency Department (HOSPITAL_COMMUNITY): Payer: Managed Care, Other (non HMO)

## 2020-02-19 LAB — GROUP A STREP BY PCR: Group A Strep by PCR: NOT DETECTED

## 2020-02-19 LAB — I-STAT BETA HCG BLOOD, ED (MC, WL, AP ONLY): I-stat hCG, quantitative: <5 m[IU]/mL (ref <5)

## 2020-02-19 MED ORDER — ACETAMINOPHEN 500 MG PO TABS
1000.0000 mg | ORAL_TABLET | Freq: Once | ORAL | Status: AC
  Administered 2020-02-19: 1000 mg via ORAL
  Filled 2020-02-19: qty 2

## 2020-02-19 MED ORDER — BENZONATATE 100 MG PO CAPS: 100.0000 mg | ORAL_CAPSULE | Freq: Three times a day (TID) | ORAL | 0 refills | Status: DC

## 2020-02-19 MED ORDER — IOHEXOL 300 MG/ML  SOLN
75.0000 mL | Freq: Once | INTRAMUSCULAR | Status: AC | PRN
  Administered 2020-02-19: 75 mL via INTRAVENOUS

## 2020-02-19 NOTE — Telephone Encounter (Signed)
[facility] Night - Client TELEPHONE ADVICE RECORD [facility] Patient Name: Carla Wheeler Gender: Female DOB: [birth year] Age: 45 Y 14 M 13 D Return Phone Number: [phone] (Primary), [phone] (Secondary) [place] [address] Client [facility] Night - Client Client Site [place] Physician Webb Silversmith - NP Contact Type Call Who Is Calling Patient / Member / Family / Caregiver Call Type Triage / Clinical Caller Name Mr. Mulvaney Relationship To Patient Spouse Return Phone Number [phone] (Primary) Chief Complaint BREATHING - shortness of breath or sounds breathless Reason for Call Symptomatic / Request for [place] states, pt having side effects from booster shot. Pt has sob-raspy. Pt has medication for this. Translation No Nurse Assessment Nurse: Louretta Shorten, RN, [place] Date/Time [name] Time): 02/18/2020 9:14:11 AM Confirm and document reason for call. If symptomatic, describe symptoms. ---Caller states pt received her Covid booster shot on Tuesdaystates pt states she has SOB and dry cough. Does the patient have any new or worsening symptoms? ---Yes Will a triage be completed? ---Yes Related visit to physician within the last 2 weeks? ---Yes Does the PT have any chronic conditions? (i.e. diabetes, asthma, this includes High risk factors for pregnancy, etc.) ---Yes List chronic conditions. ---hypothyroidism, anemia, asthma Is the patient pregnant or possibly pregnant? (Ask all females between the ages of 55-55) ---No Is this a behavioral health or substance abuse call? ---No Guidelines Guideline Title Affirmed Question Affirmed Notes Nurse Date/Time (Eastern Time) COVID-19 - Diagnosed or Suspected MODERATE difficulty breathing (e.g., speaks in phrases, SOB even at rest, pulse 100-120) Louretta Shorten, RN, [place] 02/18/2020 9:17:19  AM Disp. Time [name] Time) Disposition Final User 02/18/2020 9:12:23 AM Send to Urgent Queue Lonia Farber 02/18/2020 9:19:31 AM Go to ED Now Yes Louretta Shorten, RN, [place] PLEASE NOTE: All timestamps contained within this report are represented as [place] Standard Time. CONFIDENTIALTY NOTICE: This fax transmission is intended only for the addressee. It contains information that is legally privileged, confidential or otherwise protected from use or disclosure. If you are not the intended recipient, you are strictly prohibited from reviewing, disclosing, copying using or disseminating any of this information or taking any action in reliance on or regarding this information. If you have received this fax in error, please notify us immediately by telephone so that we can arrange for its return to [place]. Phone: [phone], Toll-Free: [phone], Fax: [phone] Page: 2 of 2 Call Id: [id number] [place] Disagree/Comply Comply Caller Understands Yes PreDisposition Call Doctor Care Advice Given Per Guideline GO TO ED NOW: * You need to be seen in the Emergency Department. * Go to the ED at ___________ [place] now. Drive carefully. Referrals GO TO FACILITY UNDECIDED  Patient was seen in ED on 02/18/2020 in regards to the above issue and was diagnosed with COVID. Called and spoke with patient to follow-up. Patient answered phone and stated, "I'm sorry, it's just really hard to talk. My son is going to pick up the medication they prescribed for me." This RN expressed concern and informed patient that if she became worse, she needed to return to ED or call 911. Patient verbalized understanding.

## 2020-02-19 NOTE — Telephone Encounter (Signed)
[facility] Night - Client TELEPHONE ADVICE RECORD [facility] Patient Name: Carla Wheeler Gender: Female DOB: 03/13/1975 Age: 45 Y 69 M 13 D Return Phone Number: [phone] (Primary) Address: City/State/Zip: [place] [address] Client [facility] Night - Client Client Site [place] Physician Webb Silversmith - NP Contact Type Call Who Is Calling Patient / Member / Family / Caregiver Call Type Triage / Clinical Relationship To Patient Self Return Phone Number [phone] (Primary) Chief Complaint BREATHING - shortness of breath or sounds breathless Reason for Call Symptomatic / Request for [name] and she has shortness of breath, fever, chills, cough and fatigue. Caller state she has questions about her test results. Translation No Nurse Assessment Nurse: Fredderick Phenix, RN, Lelan Pons Date/Time [name] Time): 02/18/2020 4:04:42 PM Confirm and document reason for call. If symptomatic, describe symptoms. ---Caller states she is in the ER, still waiting to be seen. Saw her labs/imaging results had been downloaded to her Mychart page. Wants to have chest xray interpretation done while she waits. Does the patient have any new or worsening symptoms? ---No Disp. Time [name] Time) Disposition Final User 02/18/2020 4:00:52 PM Send to Urgent Queue Josephine Cables 02/18/2020 4:06:17 PM Clinical Call Yes Fredderick Phenix, RN, Lelan Pons

## 2020-02-19 NOTE — ED Provider Notes (Signed)
Results for orders placed or performed during the hospital encounter of 02/18/20  Resp Panel by RT-PCR (Flu A&B, Covid) Nasopharyngeal Swab   Specimen: Nasopharyngeal Swab; Nasopharyngeal(NP) swabs in vial transport medium  Result Value Ref Range   SARS Coronavirus 2 by RT PCR POSITIVE (A) NEGATIVE   Influenza A by PCR NEGATIVE NEGATIVE   Influenza B by PCR NEGATIVE NEGATIVE  CBC with Differential  Result Value Ref Range   WBC 5.7 4.0 - 10.5 K/uL   RBC 4.37 3.87 - 5.11 MIL/uL   Hemoglobin 9.9 (L) 12.0 - 15.0 g/dL   HCT 33.8 (L) 36.0 - 46.0 %   MCV 77.3 (L) 80.0 - 100.0 fL   MCH 22.7 (L) 26.0 - 34.0 pg   MCHC 29.3 (L) 30.0 - 36.0 g/dL   RDW 17.3 (H) 11.5 - 15.5 %   Platelets 228 150 - 400 K/uL   nRBC 0.0 0.0 - 0.2 %   Neutrophils Relative % 79 %   Neutro Abs 4.5 1.7 - 7.7 K/uL   Lymphocytes Relative 12 %   Lymphs Abs 0.7 0.7 - 4.0 K/uL   Monocytes Relative 9 %   Monocytes Absolute 0.5 0.1 - 1.0 K/uL   Eosinophils Relative 0 %   Eosinophils Absolute 0.0 0.0 - 0.5 K/uL   Basophils Relative 0 %   Basophils Absolute 0.0 0.0 - 0.1 K/uL   Immature Granulocytes 0 %   Abs Immature Granulocytes 0.02 0.00 - 0.07 K/uL  Basic metabolic panel  Result Value Ref Range   Sodium 135 135 - 145 mmol/L   Potassium 4.0 3.5 - 5.1 mmol/L   Chloride 103 98 - 111 mmol/L   CO2 21 (L) 22 - 32 mmol/L   Glucose, Bld 136 (H) 70 - 99 mg/dL   BUN 10 6 - 20 mg/dL   Creatinine, Ser 0.74 0.44 - 1.00 mg/dL   Calcium 8.4 (L) 8.9 - 10.3 mg/dL   GFR, Estimated >60 >60 mL/min   Anion gap 11 5 - 15  I-Stat Beta hCG blood, ED ([facility], [facility], [facility] only)  Result Value Ref Range   I-stat hCG, quantitative <5.0 <5 mIU/mL   Comment 3          Troponin I (High Sensitivity)  Result Value Ref Range   Troponin I (High Sensitivity) 3 <18 ng/L   DG Chest 1 View  Result Date: 02/18/2020 CLINICAL DATA:  Cough EXAM: CHEST  1 VIEW COMPARISON:  None. FINDINGS: The cardiomediastinal silhouette is mildly enlarged in contour.Prominent  azygos contour. No pleural effusion. No pneumothorax. Diffuse interstitial prominence. Visualized abdomen is unremarkable. Globular calcific density overlying the RIGHT proximal humerus may reflect calcium hydroxyapatite deposition. IMPRESSION: Diffuse interstitial prominence with differential considerations including pulmonary edema or atypical/viral pneumonia (including COVID 19 infection). Electronically Signed   By: Valentino Saxon MD   On: 02/18/2020 11:05   CT Soft Tissue Neck W Contrast  Result Date: 02/19/2020 CLINICAL DATA:  Initial evaluation for acute cough, chest tightness, epiglottitis or tonsillitis suspected. COVID positive. EXAM: CT NECK WITH CONTRAST TECHNIQUE: Multidetector CT imaging of the neck was performed using the standard protocol following the bolus administration of intravenous contrast. CONTRAST:  78mL OMNIPAQUE IOHEXOL 300 MG/ML  SOLN COMPARISON:  None available. FINDINGS: Pharynx and larynx: Oral cavity within normal limits. No acute abnormality about the dentition. [place] tonsils symmetric and within normal limits bilaterally. No tonsillar or peritonsillar abscess. Remainder of the oropharynx and nasopharynx within normal limits. No retropharyngeal collection or swelling. Epiglottis within normal  limits without evidence for acute epiglottitis. Vallecula largely effaced by the lingual tonsils. Remainder of the hypopharynx and supraglottic larynx within normal limits. Glottis normal. Subglottic airway clear. Salivary glands: Salivary glands including the parotid and submandibular glands are within normal limits. Thyroid: There is a possible approximate 2.1 cm nodule extending from the lower pole of the left thyroid lobe (series 3, image 68), difficult to be certain given streak artifact through this region. Thyroid otherwise unremarkable. Lymph nodes: No enlarged or pathologic adenopathy within the neck. Vascular: Normal intravascular enhancement seen throughout the neck.  Limited intracranial: Unremarkable. Visualized orbits: Unremarkable. Mastoids and visualized paranasal sinuses: Small volume layering fluid noted within left sphenoid sinus. Scattered mucosal thickening noted throughout the remainder the visualized paranasal sinuses. Visualized mastoid air cells are clear. Skeleton: No acute osseous finding. No discrete or worrisome osseous lesions. Upper chest: . Focus of hazy ground-glass opacity noted at the periphery aspect of the upper left lung, nonspecific, but could reflect acute infection, including atypical/viral pneumonitis as an etiology. Visualized upper chest demonstrates no other acute finding. Other: None. IMPRESSION: 1. No CT evidence for acute abnormality within the neck. 2. Focus of hazy ground-glass opacity at the periphery of the upper left lung, nonspecific, but could reflect acute infection, including atypical/viral pneumonitis as an etiology. 3. Possible 2.1 cm nodule extending from the lower pole of the left thyroid lobe, difficult to be certain given streak artifact through this region. Recommend thyroid US for further evaluation. This could be performed on a nonemergent outpatient basis. (Ref: J Am Coll Radiol. 2015 Feb;12(2): 143-50). Electronically Signed   By: Jeannine Boga M.D.   On: 02/19/2020 01:20   CT soft tissue without infectious findings, FB, or obstructive process.  Does have incidental finding of possible thyroid nodule which will need OP follow-up.  Strep test also negative.  Symptoms likely due to her covid-19 infection.  She was made aware of CT findings and need for OP follow-up, also written into discharge paperwork.  She has been referred for MAB.  May return here for any new/acute changes.   Larene Pickett, PA-C 02/19/20 Lennox Grumbles, [place], MD 02/19/20 [phone]

## 2020-02-19 NOTE — Discharge Instructions (Addendum)
Your CT of the throat did not show any infectious findings.  There is a possible nodule on your thyroid, you need to have this followed up with your primary care doctor after your covid quarantine period is completed.  Your strep test was also negative.  Your symptoms today appear to be due to covid-19.  Medications have been sent to pharmacy and you have been referred for outpatient monoclonal antibody therapy.  At this time there does not appear to be the presence of an emergent medical condition, however there is always the potential for conditions to change. Please read and follow the below instructions.  Please return to the Emergency Department immediately for any new or worsening symptoms. Please be sure to follow up with your Primary Care Provider within one week regarding your visit today; please call their office to schedule an appointment even if you are feeling better for a follow-up visit. Please continue to quarantine to avoid spread of Covid to others.  Please see your primary care provider next week for a recheck.  Please drink plenty water to avoid dehydration and get plenty of rest.  You may take the medication Tessalon as prescribed to help with your cough.  You have been referred to the monoclonal antibody infusion center, they may call you in the next 1-2 days to see if you qualify for an infusion.  Please be sure to answer your phone when they call as will be from an unrecognized number.  Go to the nearest Emergency Department immediately if: You have fever or chills You have chest pain or trouble breathing You have very bad or constant: Headache. Ear pain. Pain in your forehead, behind your eyes, and over your cheekbones (sinus pain). You have long-lasting (chronic) lung disease along with any of these: Wheezing. Long-lasting cough. Coughing up blood. A change in your usual mucus. You have a stiff neck. You have changes in your: Vision. Hearing. Thinking. Mood. You  have any new/concerning or worsening of symptoms   Please read the additional information packets attached to your discharge summary.  Do not take your medicine if  develop an itchy rash, swelling in your mouth or lips, or difficulty breathing; call 911 and seek immediate emergency medical attention if this occurs.  You may review your lab tests and imaging results in their entirety on your MyChart account.  Please discuss all results of fully with your primary care provider and other specialist at your follow-up visit.  Note: Portions of this text may have been transcribed using voice recognition software. Every effort was made to ensure accuracy; however, inadvertent computerized transcription errors may still be present.

## 2020-02-19 NOTE — Telephone Encounter (Signed)
Noted, I would encourage her to schedule hospital follow-up with me

## 2020-02-20 ENCOUNTER — Encounter: Payer: Self-pay | Admitting: Internal Medicine

## 2020-02-23 ENCOUNTER — Encounter: Payer: Self-pay | Admitting: Internal Medicine

## 2020-02-23 ENCOUNTER — Other Ambulatory Visit: Payer: Self-pay

## 2020-02-23 ENCOUNTER — Ambulatory Visit (INDEPENDENT_AMBULATORY_CARE_PROVIDER_SITE_OTHER): Payer: Managed Care, Other (non HMO) | Admitting: Internal Medicine

## 2020-02-23 VITALS — BP 132/86 | HR 81 | Temp 98.1°F | Wt 196.0 lb

## 2020-02-23 DIAGNOSIS — E041 Nontoxic single thyroid nodule: Secondary | ICD-10-CM | POA: Diagnosis not present

## 2020-02-23 DIAGNOSIS — U071 COVID-19: Secondary | ICD-10-CM | POA: Diagnosis not present

## 2020-02-23 DIAGNOSIS — F439 Reaction to severe stress, unspecified: Secondary | ICD-10-CM | POA: Diagnosis not present

## 2020-02-23 MED ORDER — FLOVENT HFA 110 MCG/ACT IN AERO
INHALATION_SPRAY | RESPIRATORY_TRACT | 0 refills | Status: DC
Start: 2020-02-23 — End: 2020-03-19

## 2020-02-23 MED ORDER — HYDROCOD POLST-CPM POLST ER 10-8 MG/5ML PO SUER: 5.0000 mL | Freq: Every evening | ORAL | 0 refills | Status: DC | PRN

## 2020-02-23 NOTE — Progress Notes (Signed)
Subjective:    Patient ID: Carla Wheeler, female    DOB: 07/05/1975, 45 y.o.   MRN: [id number]  HPI  Patient presents to the clinic today for ER follow-up.  She went to the ER 1/9 with 4-day history of URI symptoms.  Her COVID test was positive.  Her chest x-ray was consistent with viral pneumonia.  She did complain of feeling a sensation that something was stuck in her throat that was causing a choking sensation which is causing her to cough.  CT soft tissue of the neck incidentally found a 2.1 cm left thyroid nodule that was not clearly defined.  She was treated with Tussionex and given a Rx for Tessalon for treatment of her cough.  She was discharged and advised to follow-up with her PCP.  Since discharge, she reports constant throat irritation. She reports difficulty talking, swallowing or taking a deep breath without having a "coughing fit". The cough is dry and nonproductive. She denies shortness of breath of chest pain. She is finishing up Prednisone but reports it has not been effective. She has also tried Albuterol but reports this does not last long.  Review of Systems      Past Medical History:  Diagnosis Date  . Asthma   . Depression   . Thyroid disease     Current Outpatient Medications  Medication Sig Dispense Refill  . albuterol (VENTOLIN HFA) 108 (90 Base) MCG/ACT inhaler TAKE 2 PUFFS BY MOUTH EVERY 6 HOURS AS NEEDED FOR WHEEZE OR SHORTNESS OF BREATH (Patient taking differently: Inhale 2 puffs into the lungs every 6 (six) hours as needed for wheezing or shortness of breath.) 8.5 each 5  . benzonatate (TESSALON) 100 MG capsule Take 1 capsule (100 mg total) by mouth every 8 (eight) hours. 21 capsule 0  . docusate sodium (COLACE) 100 MG capsule Take 100 mg by mouth daily.    . ferrous sulfate 325 (65 FE) MG tablet Take 325 mg by mouth daily with breakfast.    . guaiFENesin-codeine (ROBITUSSIN AC) 100-10 MG/5ML syrup Take 5-10 mLs by mouth at bedtime as needed for cough.  180 mL 0  . levothyroxine (SYNTHROID) 125 MCG tablet TAKE 1 TABLET BY MOUTH EVERY DAY (Patient taking differently: Take 125 mcg by mouth daily before breakfast.) 30 tablet 8  . predniSONE (DELTASONE) 20 MG tablet 3 tabs by mouth daily x 3 days, then 2 tabs by mouth daily x 2 days then 1 tab by mouth daily x 2 days 15 tablet 0   No current facility-administered medications for this visit.    Allergies  Allergen Reactions  . Augmentin [Amoxicillin-Pot Clavulanate] Other (See Comments)    Muscle cramps  . Hydrogen Peroxide Hives    Family History  Problem Relation Age of Onset  . Cancer Mother        Breast  . Arthritis Father   . Heart disease Father   . Stroke Father   . Hypertension Father   . Diabetes Father   . Congestive Heart Failure Father   . Cancer Maternal Grandmother        Breast  . Hypertension Maternal Grandmother   . Hypertension Paternal Grandmother   . Learning disabilities Paternal Grandmother     Social History   Socioeconomic History  . Marital status: Married    Spouse name: Not on file  . Number of children: Not on file  . Years of education: Not on file  . Highest education level: Not on file  Occupational History  . Not on file  Tobacco Use  . Smoking status: Former Smoker    Types: Cigars  . Smokeless tobacco: Never Used  Substance and Sexual Activity  . Alcohol use: Yes    Alcohol/week: 3.0 standard drinks    Types: 3 Glasses of wine per week    Comment: occasional  . Drug use: No  . Sexual activity: Yes  Other Topics Concern  . Not on file  Social History Narrative  . Not on file   Social Determinants of Health   Financial Resource Strain: Not on file  Food Insecurity: Not on file  Transportation Needs: Not on file  Physical Activity: Not on file  Stress: Not on file  Social Connections: Not on file  Intimate Partner Violence: Not on file     Constitutional: Denies fever, malaise, fatigue, headache or abrupt weight changes.   HEENT: Pt reports throat irritation. Denies eye pain, eye redness, ear pain, ringing in the ears, wax buildup, runny nose, nasal congestion, bloody nose. Respiratory: Pt reports cough. Denies difficulty breathing, shortness of breath, or sputum production.   Cardiovascular: Denies chest pain, chest tightness, palpitations or swelling in the hands or feet.  Gastrointestinal: Denies abdominal pain, bloating, constipation, diarrhea or blood in the stool.  Neurological: Denies dizziness, difficulty with memory, difficulty with speech or problems with balance and coordination.  Psych: Pt reports stress. Denies anxiety, depression, SI/HI.  No other specific complaints in a complete review of systems (except as listed in HPI above).  Objective:   Physical Exam   BP 132/86   Pulse 81   Temp 98.1 F (36.7 C) (Temporal)   Wt 196 lb (88.9 kg)   SpO2 98%   BMI 38.28 kg/m   Wt Readings from Last 3 Encounters:  02/16/20 210 lb (95.3 kg)  07/12/19 210 lb (95.3 kg)  04/11/19 210 lb (95.3 kg)    General: Appears her stated age, obese, in NAD. HEENT: Head: normal shape and size; Eyes: sclera white, no icterus, conjunctiva pink, PERRLA and EOMs intact; Throat: Hoarseness noted. Neck:  Neck supple, trachea midline. Unable to feel thyroid mass noted on CT exam. No  thyromegaly present.  Cardiovascular: Normal rate and rhythm. S1,S2 noted.  No murmur, rubs or gallops noted.  Pulmonary/Chest: Normal effort and positive vesicular breath sounds. No respiratory distress. No wheezes, rales or ronchi noted.  Neurological: Alert and oriented.  Psychiatric: Tearful. Behavior is normal. Judgment and thought content normal.    BMET    Component Value Date/Time   NA 135 02/18/2020 2038   K 4.0 02/18/2020 2038   CL 103 02/18/2020 2038   CO2 21 (L) 02/18/2020 2038   GLUCOSE 136 (H) 02/18/2020 2038   BUN 10 02/18/2020 2038   CREATININE 0.74 02/18/2020 2038   CREATININE 0.79 07/19/2015 1511   CALCIUM  8.4 (L) 02/18/2020 2038   GFRNONAA >60 02/18/2020 2038    Lipid Panel     Component Value Date/Time   CHOL 172 10/27/2018 1558   TRIG 135.0 10/27/2018 1558   HDL 40.30 10/27/2018 1558   CHOLHDL 4 10/27/2018 1558   VLDL 27.0 10/27/2018 1558   LDLCALC 104 (H) 10/27/2018 1558    CBC    Component Value Date/Time   WBC 5.7 02/18/2020 2038   RBC 4.37 02/18/2020 2038   HGB 9.9 (L) 02/18/2020 2038   HCT 33.8 (L) 02/18/2020 2038   PLT 228 02/18/2020 2038   MCV 77.3 (L) 02/18/2020 2038   MCH 22.7 (  L) 02/18/2020 2038   MCHC 29.3 (L) 02/18/2020 2038   RDW 17.3 (H) 02/18/2020 2038   LYMPHSABS 0.7 02/18/2020 2038   MONOABS 0.5 02/18/2020 2038   EOSABS 0.0 02/18/2020 2038   BASOSABS 0.0 02/18/2020 2038    Hgb A1C Lab Results  Component Value Date   HGBA1C 5.9 10/27/2018           Assessment & Plan:   ER Follow up for Covid 19, Thyroid Nodule:  ER notes, labs and imaging reviewed It seems as if she has developed and upper airway cough syndrome s/p Covid RX for Floven 110 mcg 2 puffs BID x 10 days then reduce to 1 puff BID x 10 days, then reduce to 1 puff daily thereafter Continue Albuterol as needed RX for Tussionex cough syrup Will obtain thyroid ultrasound for further evaluation and characterization of thyroid nodule noted on CT  Stress:  Support offered today Will monitor  We will follow-up after imaging, return precautions discussed Webb Silversmith, NP This visit occurred during the SARS-CoV-2 public health emergency.  Safety protocols were in place, including screening questions prior to the visit, additional usage of staff PPE, and extensive cleaning of exam room while observing appropriate contact time as indicated for disinfecting solutions.

## 2020-02-23 NOTE — Patient Instructions (Signed)
Thyroid Nodule  A thyroid nodule is an isolated growth of thyroid cells that forms a lump in your thyroid gland. The thyroid gland is a butterfly-shaped gland. It is found in the lower front of your neck. This gland sends chemical messengers (hormones) through your blood to all parts of your body. These hormones are important in regulating your body temperature and helping your body to use energy. Thyroid nodules are common. Most are not cancerous (benign). You may have one nodule or several nodules. Different types of thyroid nodules include nodules that:  Grow and fill with fluid (thyroid cysts).  Produce too much thyroid hormone (hot nodules or hyperthyroid).  Produce no thyroid hormone (cold nodules or hypothyroid).  Form from cancer cells (thyroid cancers). What are the causes? In most cases, the cause of this condition is not known. What increases the risk? The following factors may make you more likely to develop this condition.  Age. Thyroid nodules become more common in people who are older than 47 years of age.  Gender. ? Benign thyroid nodules are more common in women. ? Cancerous (malignant) thyroid nodules are more common in men.  A family history that includes: ? Thyroid nodules. ? Pheochromocytoma. ? Thyroid carcinoma. ? Hyperparathyroidism.  Certain kinds of thyroid diseases, such as Hashimoto's thyroiditis.  Lack of iodine in your diet.  A history of head and neck radiation, such as from previous cancer treatment. What are the signs or symptoms? In many cases, there are no symptoms. If you have symptoms, they may include:  A lump in your lower neck.  Feeling a lump or tickle in your throat.  Pain in your neck, jaw, or ear.  Having trouble swallowing. Hot nodules may cause symptoms that include:  Weight loss.  Warm, flushed skin.  Feeling hot.  Feeling nervous.  A racing heartbeat. Cold nodules may cause symptoms that include:  Weight  gain.  Dry skin.  Brittle hair. This may also occur with hair loss.  Feeling cold.  Fatigue. Thyroid cancer nodules may cause symptoms that include:  Hard nodules that feel stuck to the thyroid gland.  Hoarseness.  Lumps in the glands near your thyroid (lymph nodes). How is this diagnosed? A thyroid nodule may be felt by your health care provider during a physical exam. This condition may also be diagnosed based on your symptoms. You may also have tests, including:  An ultrasound. This may be done to confirm the diagnosis.  A biopsy. This involves taking a sample from the nodule and looking at it under a microscope.  Blood tests to make sure that your thyroid is working properly.  A thyroid scan. This test uses a radioactive tracer injected into a vein to create an image of the thyroid gland on a computer screen.  Imaging tests such as MRI or CT scan. These may be done if: ? Your nodule is large. ? Your nodule is blocking your airway. ? Cancer is suspected. How is this treated? Treatment depends on the cause and size of your nodule or nodules. If the nodule is benign, treatment may not be necessary. Your health care provider may monitor the nodule to see if it goes away without treatment. If the nodule continues to grow, is cancerous, or does not go away, treatment may be needed. Treatment may include:  Having a cystic nodule drained with a needle.  Ablation therapy. In this treatment, alcohol is injected into the area of the nodule to destroy the cells. Ablation with heat (  thermal ablation) may also be used.  Radioactive iodine. In this treatment, radioactive iodine is given as a pill or liquid that you drink. This substance causes the thyroid nodule to shrink.  Surgery to remove the nodule. Part or all of your thyroid gland may need to be removed as well.  Medicines. Follow these instructions at home:  Pay attention to any changes in your nodule.  Take  over-the-counter and prescription medicines only as told by your health care provider.  Keep all follow-up visits as told by your health care provider. This is important. Contact a health care provider if:  Your voice changes.  You have trouble swallowing.  You have pain in your neck, ear, or jaw that is getting worse.  Your nodule gets bigger.  Your nodule starts to make it harder for you to breathe.  Your muscles look like they are shrinking (muscle wasting). Get help right away if:  You have chest pain.  There is a loss of consciousness.  You have a sudden fever.  You feel confused.  You are seeing or hearing things that other people do not see or hear (having hallucinations).  You feel very weak.  You have mood swings.  You feel very restless.  You feel suddenly nauseous or throw up.  You suddenly have diarrhea. Summary  A thyroid nodule is an isolated growth of thyroid cells that forms a lump in your thyroid gland.  Thyroid nodules are common. Most are not cancerous (benign). You may have one nodule or several nodules.  Treatment depends on the cause and size of your nodule or nodules. If the nodule is benign, treatment may not be necessary.  Your health care provider may monitor the nodule to see if it goes away without treatment. If the nodule continues to grow, is cancerous, or does not go away, treatment may be needed. This information is not intended to replace advice given to you by your health care provider. Make sure you discuss any questions you have with your health care provider. Document Revised: 09/10/2017 Document Reviewed: 09/13/2017 Elsevier Patient Education  [place].

## 2020-02-25 ENCOUNTER — Encounter: Payer: Self-pay | Admitting: Internal Medicine

## 2020-03-07 ENCOUNTER — Other Ambulatory Visit: Payer: Self-pay

## 2020-03-07 ENCOUNTER — Ambulatory Visit
Admission: RE | Admit: 2020-03-07 | Discharge: 2020-03-07 | Disposition: A | Discharge status: Home / Self Care | Payer: Managed Care, Other (non HMO) | Source: Ambulatory Visit | Attending: Internal Medicine | Admitting: Internal Medicine

## 2020-03-07 ENCOUNTER — Other Ambulatory Visit: Payer: Self-pay | Admitting: Internal Medicine

## 2020-03-07 DIAGNOSIS — E041 Nontoxic single thyroid nodule: Secondary | ICD-10-CM | POA: Diagnosis present

## 2020-03-08 ENCOUNTER — Encounter: Payer: Self-pay | Admitting: Internal Medicine

## 2020-03-08 ENCOUNTER — Other Ambulatory Visit: Payer: Self-pay | Admitting: Internal Medicine

## 2020-03-09 ENCOUNTER — Other Ambulatory Visit: Payer: Self-pay | Admitting: Internal Medicine

## 2020-03-16 ENCOUNTER — Other Ambulatory Visit: Payer: Self-pay | Admitting: Internal Medicine

## 2020-04-07 ENCOUNTER — Other Ambulatory Visit: Payer: Self-pay | Admitting: Internal Medicine

## 2020-04-22 ENCOUNTER — Other Ambulatory Visit: Payer: Self-pay | Admitting: Internal Medicine

## 2020-05-25 ENCOUNTER — Other Ambulatory Visit: Payer: Self-pay | Admitting: Internal Medicine

## 2020-07-23 ENCOUNTER — Other Ambulatory Visit: Payer: Self-pay | Admitting: Internal Medicine

## 2020-07-23 NOTE — Telephone Encounter (Signed)
   Notes to clinic Not a practice we approve rx for.

## 2020-07-24 NOTE — Telephone Encounter (Signed)
Refill request Levothyroxine Last refill 04/23/20 #90 Last office visit 02/23/20 No upcoming appointment scheuled

## 2020-09-02 ENCOUNTER — Encounter: Payer: Managed Care, Other (non HMO) | Admitting: Nurse Practitioner

## 2020-11-04 ENCOUNTER — Encounter: Payer: Self-pay | Admitting: Nurse Practitioner

## 2020-11-04 ENCOUNTER — Other Ambulatory Visit: Payer: Self-pay

## 2020-11-04 ENCOUNTER — Ambulatory Visit (INDEPENDENT_AMBULATORY_CARE_PROVIDER_SITE_OTHER): Payer: BC Managed Care – PPO | Admitting: Nurse Practitioner

## 2020-11-04 VITALS — BP 108/72 | HR 68 | Temp 98.7°F | Resp 12 | Ht 60.5 in | Wt 202.4 lb

## 2020-11-04 DIAGNOSIS — D508 Other iron deficiency anemias: Secondary | ICD-10-CM | POA: Diagnosis not present

## 2020-11-04 DIAGNOSIS — E669 Obesity, unspecified: Secondary | ICD-10-CM

## 2020-11-04 DIAGNOSIS — J4521 Mild intermittent asthma with (acute) exacerbation: Secondary | ICD-10-CM

## 2020-11-04 DIAGNOSIS — R202 Paresthesia of skin: Secondary | ICD-10-CM | POA: Diagnosis not present

## 2020-11-04 DIAGNOSIS — E039 Hypothyroidism, unspecified: Secondary | ICD-10-CM | POA: Diagnosis not present

## 2020-11-04 NOTE — Assessment & Plan Note (Signed)
Patient was curious about medication eating and weight loss.  Did discuss with patient she needs to food journal for approximately a week for me.  She will work on that.  We will draw A1c and lipids today.  Pending lab results.  She was on phentermine in topiramate in the past.  Continue to monitor currently

## 2020-11-04 NOTE — Assessment & Plan Note (Signed)
Currently maintained on levothyroxine 125 mcg.  Did discuss appropriate technique and taking medication.  Patient acknowledged.  We will check TSH in office today pending lab result.  Continue levothyroxine 125 mcg currently.

## 2020-11-04 NOTE — Assessment & Plan Note (Signed)
Well-controlled.  Patient states she only has issues with changes of season about twice a year.  States that she will use her inhaler then.  Other times not using inhaler.  When asked she does not wake up at night gasping for breath or feeling short of breath.  Continue using albuterol inhaler as needed.  Continue to monitor

## 2020-11-04 NOTE — Assessment & Plan Note (Signed)
Currently maintained on ferrous sulfate 325 mg once daily.  Patient tolerates it okay.  He states sometimes it causes nausea and constipation.  She is taking a stool softener as needed.  Pending iron studies and lab.  Continue ferrous sulfate 325 mg once daily

## 2020-11-04 NOTE — Assessment & Plan Note (Signed)
Has been going on for years.  Bilateral upper arms.  States that his leg is going to sleep.  Has been evaluated by vascular 1 time and stated it looked okay.  This does seem positional upon interview.  Not sure if it happens when she lies on her back.  But does happen when she is on the affected side.  We will check electrolytes and magnesium today.  Exam benign continue to monitor.  Pending lab results

## 2020-11-04 NOTE — Progress Notes (Signed)
Established Patient Office Visit  Subjective:  Patient ID: Carla Wheeler, female    DOB: [birth year]  Age: 45 y.o. MRN: [id number]  CC:  Chief Complaint  Patient presents with   transfer of care    From [facility]   Numbness    Mainly with laying down. Use to be right arm and right leg and now it is left arm and left leg-thigh area. Feels like it is getting worse in intensity and frequency. This has been an issue for years now, 2013?Marland Kitchen Has seen vein and vascular years ago for this.    HPI Carla Wheeler presents for   Asthma: Uses albuterol inhaler at the change of seasons. Not even monthly.  Hypothyroid: Levothyroxine tolerated well. Did review how to take medication. She did mention at times that she will take it with orange juice  Iron def: takes iron daily. Does deal with constipation and nausea.  Paresthesia: Has been of ryears. States a tightness "like going to sleep". Daily while she is laying down. Seems to be side dependent. States she is unsure if it happens when she lays on her back. Will try to notice. Was seen by vascular once and states everything look ok.  Past Medical History:  Diagnosis Date   Asthma    Depression    Thyroid disease     Past Surgical History:  Procedure Laterality Date   CESAREAN SECTION      Family History  Problem Relation Age of Onset   Cancer Mother        Breast   Arthritis Father    Heart disease Father    Stroke Father    Hypertension Father    Diabetes Father    Congestive Heart Failure Father    Cancer Maternal Grandmother        Breast   Hypertension Maternal Grandmother    Hypertension Paternal Grandmother    Learning disabilities Paternal Grandmother     Social History   Socioeconomic History   Marital status: Married    Spouse name: Not on file   Number of children: Not on file   Years of education: Not on file   Highest education level: Not on file  Occupational History   Not on file  Tobacco Use    Smoking status: Former    Types: Cigars   Smokeless tobacco: Never  Substance and Sexual Activity   Alcohol use: Yes    Alcohol/week: 3.0 standard drinks    Types: 3 Glasses of wine per week    Comment: occasional   Drug use: No   Sexual activity: Yes  Other Topics Concern   Not on file  Social History Narrative   Not on file   Social Determinants of Health   Financial Resource Strain: Not on file  Food Insecurity: Not on file  Transportation Needs: Not on file  Physical Activity: Not on file  Stress: Not on file  Social Connections: Not on file  Intimate Partner Violence: Not on file    Outpatient Medications Prior to Visit  Medication Sig Dispense Refill   albuterol (VENTOLIN HFA) 108 (90 Base) MCG/ACT inhaler TAKE 2 PUFFS BY MOUTH EVERY 6 HOURS AS NEEDED FOR WHEEZE OR SHORTNESS OF BREATH (Patient taking differently: Inhale 2 puffs into the lungs every 6 (six) hours as needed for wheezing or shortness of breath.) 8.5 each 5   docusate sodium (COLACE) 100 MG capsule Take 100 mg by mouth daily.     ferrous  sulfate 325 (65 FE) MG tablet Take 325 mg by mouth daily with breakfast.     levothyroxine (SYNTHROID) 125 MCG tablet TAKE 1 TABLET BY MOUTH EVERY DAY BEFORE BREAKFAST 90 tablet 0   benzonatate (TESSALON) 100 MG capsule Take 1 capsule (100 mg total) by mouth every 8 (eight) hours. 21 capsule 0   chlorpheniramine-HYDROcodone (TUSSIONEX PENNKINETIC ER) 10-8 MG/5ML SUER Take 5 mLs by mouth at bedtime as needed. 140 mL 0   FLOVENT HFA 110 MCG/ACT inhaler INHALE 2 PUFFS BY MOUTH IN THE AM AND AT BEDTIME FOR 10 DAYS, THEN 1 PUFF IN THE AM AND AT BEDTIME. 12 each 0   No facility-administered medications prior to visit.    Allergies  Allergen Reactions   Augmentin [Amoxicillin-Pot Clavulanate] Other (See Comments)    Muscle cramps   Hydrogen Peroxide Hives    ROS Review of Systems  Constitutional:  Negative for chills and fever.  Respiratory:  Negative for shortness of  breath.   Cardiovascular:  Negative for chest pain.  Gastrointestinal:  Positive for constipation. Negative for diarrhea, nausea and vomiting.  Skin:  Negative for color change and pallor.  Neurological:  Positive for numbness. Negative for dizziness and weakness.  Psychiatric/Behavioral:  Negative for hallucinations and suicidal ideas.      Objective:    Physical Exam Vitals and nursing note reviewed.  Constitutional:      Appearance: Normal appearance.  HENT:     Right Ear: Tympanic membrane, ear canal and external ear normal. There is no impacted cerumen.     Left Ear: Tympanic membrane, ear canal and external ear normal. There is no impacted cerumen.     Mouth/Throat:     Mouth: Mucous membranes are moist.     Pharynx: Oropharynx is clear.  Eyes:     Extraocular Movements: Extraocular movements intact.     Pupils: Pupils are equal, round, and reactive to light.  Neck:     Thyroid: No thyroid mass, thyromegaly or thyroid tenderness.  Cardiovascular:     Rate and Rhythm: Normal rate and regular rhythm.  Pulmonary:     Effort: Pulmonary effort is normal.     Breath sounds: Normal breath sounds.  Abdominal:     General: Abdomen is flat. Bowel sounds are normal.  Musculoskeletal:     Right lower leg: No edema.     Left lower leg: No edema.  Lymphadenopathy:     Cervical: No cervical adenopathy.  Skin:    General: Skin is warm.  Neurological:     Mental Status: She is alert.     Motor: No weakness.     Coordination: Coordination normal.     Deep Tendon Reflexes: Reflexes normal.  Psychiatric:        Mood and Affect: Mood normal.        Behavior: Behavior normal.        Thought Content: Thought content normal.        Judgment: Judgment normal.    BP 108/72   Pulse 68   Temp 98.7 F (37.1 C)   Resp 12   Ht 5' 0.5" (1.537 m)   Wt 202 lb 6 oz (91.8 kg)   LMP 09/23/2020   SpO2 97%   BMI 38.87 kg/m  Wt Readings from Last 3 Encounters:  11/04/20 202 lb 6 oz (91.8  kg)  02/23/20 196 lb (88.9 kg)  02/16/20 210 lb (95.3 kg)     Health Maintenance Due  Topic Date Due  HIV Screening  Never done   Hepatitis C Screening  Never done   COLONOSCOPY (Pts 45-78yrs Insurance coverage will need to be confirmed)  Never done   MAMMOGRAM  10/31/2020   PAP SMEAR-Modifier  10/01/2020    There are no preventive care reminders to display for this patient.  Lab Results  Component Value Date   TSH 3.60 04/11/2019   Lab Results  Component Value Date   WBC 5.7 02/18/2020   HGB 9.9 (L) 02/18/2020   HCT 33.8 (L) 02/18/2020   MCV 77.3 (L) 02/18/2020   PLT 228 02/18/2020   Lab Results  Component Value Date   NA 135 02/18/2020   K 4.0 02/18/2020   CO2 21 (L) 02/18/2020   GLUCOSE 136 (H) 02/18/2020   BUN 10 02/18/2020   CREATININE 0.74 02/18/2020   BILITOT 0.2 10/27/2018   ALKPHOS 47 10/27/2018   AST 13 10/27/2018   ALT 15 10/27/2018   PROT 7.0 10/27/2018   ALBUMIN 4.2 10/27/2018   CALCIUM 8.4 (L) 02/18/2020   ANIONGAP 11 02/18/2020   GFR 68.64 10/27/2018   Lab Results  Component Value Date   CHOL 172 10/27/2018   Lab Results  Component Value Date   HDL 40.30 10/27/2018   Lab Results  Component Value Date   LDLCALC 104 (H) 10/27/2018   Lab Results  Component Value Date   TRIG 135.0 10/27/2018   Lab Results  Component Value Date   CHOLHDL 4 10/27/2018   Lab Results  Component Value Date   HGBA1C 5.9 10/27/2018      Assessment & Plan:   Problem List Items Addressed This Visit       Respiratory   Mild intermittent asthma with acute exacerbation    Well-controlled.  Patient states she only has issues with changes of season about twice a year.  States that she will use her inhaler then.  Other times not using inhaler.  When asked she does not wake up at night gasping for breath or feeling short of breath.  Continue using albuterol inhaler as needed.  Continue to monitor        Endocrine   Hypothyroidism - Primary    Currently  maintained on levothyroxine 125 mcg.  Did discuss appropriate technique and taking medication.  Patient acknowledged.  We will check TSH in office today pending lab result.  Continue levothyroxine 125 mcg currently.      Relevant Orders   TSH     Other   Anemia, iron deficiency    Currently maintained on ferrous sulfate 325 mg once daily.  Patient tolerates it okay.  He states sometimes it causes nausea and constipation.  She is taking a stool softener as needed.  Pending iron studies and lab.  Continue ferrous sulfate 325 mg once daily      Relevant Orders   CBC   IBC + Ferritin   Obesity (BMI 35.0-39.9 without comorbidity)    Patient was curious about medication eating and weight loss.  Did discuss with patient she needs to food journal for approximately a week for me.  She will work on that.  We will draw A1c and lipids today.  Pending lab results.  She was on phentermine in topiramate in the past.  Continue to monitor currently      Relevant Orders   Lipid panel   Hemoglobin A1c   Paresthesia    Has been going on for years.  Bilateral upper arms.  States that his leg is  going to sleep.  Has been evaluated by vascular 1 time and stated it looked okay.  This does seem positional upon interview.  Not sure if it happens when she lies on her back.  But does happen when she is on the affected side.  We will check electrolytes and magnesium today.  Exam benign continue to monitor.  Pending lab results      Relevant Orders   Magnesium   Comprehensive metabolic panel    No orders of the defined types were placed in this encounter.   Follow-up: Return in about 3 months (around 02/03/2021) for CPE.   This visit occurred during the SARS-CoV-2 public health emergency.  Safety protocols were in place, including screening questions prior to the visit, additional usage of staff PPE, and extensive cleaning of exam room while observing appropriate contact time as indicated for disinfecting  solutions.   Romilda Garret, NP

## 2020-11-05 ENCOUNTER — Other Ambulatory Visit (INDEPENDENT_AMBULATORY_CARE_PROVIDER_SITE_OTHER): Payer: BC Managed Care – PPO

## 2020-11-05 ENCOUNTER — Encounter: Payer: Managed Care, Other (non HMO) | Admitting: Adult Health

## 2020-11-05 DIAGNOSIS — E039 Hypothyroidism, unspecified: Secondary | ICD-10-CM

## 2020-11-05 LAB — IBC + FERRITIN
Ferritin: 3.6 ng/mL — ABNORMAL LOW (ref 10.0–291.0)
Iron: 19 ug/dL — ABNORMAL LOW (ref 42–145)
Saturation Ratios: 3.9 % — ABNORMAL LOW (ref 20.0–50.0)
TIBC: 487.2 ug/dL — ABNORMAL HIGH (ref 250.0–450.0)
Transferrin: 348 mg/dL (ref 212.0–360.0)

## 2020-11-05 LAB — LIPID PANEL
Cholesterol: 186 mg/dL (ref 0–200)
HDL: 47.3 mg/dL (ref >39.00)
LDL Cholesterol: 117 mg/dL — ABNORMAL HIGH (ref 0–99)
NonHDL: 139.16
Total CHOL/HDL Ratio: 4
Triglycerides: 113 mg/dL (ref 0.0–149.0)
VLDL: 22.6 mg/dL (ref 0.0–40.0)

## 2020-11-05 LAB — COMPREHENSIVE METABOLIC PANEL
ALT: 11 U/L (ref 0–35)
AST: 13 U/L (ref 0–37)
Albumin: 4.3 g/dL (ref 3.5–5.2)
Alkaline Phosphatase: 47 U/L (ref 39–117)
BUN: 14 mg/dL (ref 6–23)
CO2: 26 mEq/L (ref 19–32)
Calcium: 8.9 mg/dL (ref 8.4–10.5)
Chloride: 104 mEq/L (ref 96–112)
Creatinine, Ser: 0.87 mg/dL (ref 0.40–1.20)
GFR: 79.67 mL/min (ref >60.00)
Glucose, Bld: 81 mg/dL (ref 70–99)
Potassium: 4.5 mEq/L (ref 3.5–5.1)
Sodium: 137 mEq/L (ref 135–145)
Total Bilirubin: 0.2 mg/dL (ref 0.2–1.2)
Total Protein: 7.2 g/dL (ref 6.0–8.3)

## 2020-11-05 LAB — CBC
HCT: 30.8 % — ABNORMAL LOW (ref 36.0–46.0)
Hemoglobin: 9.5 g/dL — ABNORMAL LOW (ref 12.0–15.0)
MCHC: 31 g/dL (ref 30.0–36.0)
MCV: 67.8 fl — ABNORMAL LOW (ref 78.0–100.0)
Platelets: 333 10*3/uL (ref 150.0–400.0)
RBC: 4.55 Mil/uL (ref 3.87–5.11)
RDW: 19.6 % — ABNORMAL HIGH (ref 11.5–15.5)
WBC: 6.1 10*3/uL (ref 4.0–10.5)

## 2020-11-05 LAB — MAGNESIUM: Magnesium: 1.9 mg/dL (ref 1.5–2.5)

## 2020-11-05 LAB — T4, FREE: Free T4: 0.62 ng/dL (ref 0.60–1.60)

## 2020-11-05 LAB — TSH: TSH: 6.98 u[IU]/mL — ABNORMAL HIGH (ref 0.35–5.50)

## 2020-11-05 LAB — HEMOGLOBIN A1C: Hgb A1c MFr Bld: 6.1 % (ref 4.6–6.5)

## 2020-11-06 LAB — HM MAMMOGRAPHY

## 2020-12-16 ENCOUNTER — Encounter: Payer: Self-pay | Admitting: Nurse Practitioner

## 2020-12-16 ENCOUNTER — Telehealth: Payer: Self-pay | Admitting: *Deleted

## 2020-12-16 ENCOUNTER — Other Ambulatory Visit: Payer: Self-pay

## 2020-12-16 ENCOUNTER — Ambulatory Visit: Payer: BC Managed Care – PPO | Admitting: Nurse Practitioner

## 2020-12-16 VITALS — BP 132/84 | HR 68 | Temp 98.0°F | Ht 60.5 in | Wt 205.0 lb

## 2020-12-16 DIAGNOSIS — R051 Acute cough: Secondary | ICD-10-CM | POA: Diagnosis not present

## 2020-12-16 DIAGNOSIS — R52 Pain, unspecified: Secondary | ICD-10-CM | POA: Insufficient documentation

## 2020-12-16 DIAGNOSIS — J040 Acute laryngitis: Secondary | ICD-10-CM | POA: Insufficient documentation

## 2020-12-16 DIAGNOSIS — J029 Acute pharyngitis, unspecified: Secondary | ICD-10-CM | POA: Insufficient documentation

## 2020-12-16 HISTORY — DX: Acute cough: R05.1

## 2020-12-16 HISTORY — DX: Pain, unspecified: R52

## 2020-12-16 LAB — POCT RAPID STREP A (OFFICE): Rapid Strep A Screen: NEGATIVE

## 2020-12-16 LAB — POCT INFLUENZA A/B
Influenza A, POC: NEGATIVE
Influenza B, POC: NEGATIVE

## 2020-12-16 NOTE — Assessment & Plan Note (Signed)
Patient is having loss of voice.  With sore throat.  Testing in office negative pending at home COVID test.  Continue to monitor we will likely send in steroids to help with voice loss and sore throat

## 2020-12-16 NOTE — Assessment & Plan Note (Signed)
Flu and strep test negative in office.  Patient will take a home COVID test when she gets home to make sure were not missing something.  Given length of symptoms in nature likely viral illness.  Did review over-the-counter regimens and the possibility of adding steroids to help with the laryngitis and sore throat.  Pending at home COVID test.

## 2020-12-16 NOTE — Telephone Encounter (Signed)
Patient scheduled for an appointment today 12/16/20 with Romilda Garret NP  at 3:00 pm.

## 2020-12-16 NOTE — Telephone Encounter (Signed)
PLEASE NOTE: All timestamps contained within this report are represented as [place] Standard Time. CONFIDENTIALTY NOTICE: This fax transmission is intended only for the addressee. It contains information that is legally privileged, confidential or otherwise protected from use or disclosure. If you are not the intended recipient, you are strictly prohibited from reviewing, disclosing, copying using or disseminating any of this information or taking any action in reliance on or regarding this information. If you have received this fax in error, please notify us immediately by telephone so that we can arrange for its return to [place]. Phone: [phone], Toll-Free: [phone], Fax: [phone] Page: 1 of 2 Call Id: [id number] [place] [facility] - Client TELEPHONE ADVICE RECORD [facility] Patient Name: Carla Wheeler Gender: Female DOB: [birth year] Age: 45 Y 11 M 11 D Return Phone Number: [phone] (Primary), [phone] (Secondary) Address[place]  [address] Client [facility] - Client Client Site [place] - Day Physician Romilda Garret- NP Contact Type Call Who Is Calling Patient / Member / Family / Caregiver Call Type Triage / Clinical Relationship To Patient Self Return Phone Number [phone] (Primary) Chief Complaint Coughing Up Blood Reason for Call Symptomatic / Request for [name] states she has a sore throat, earache, and coughing up bloody mucous. She has an appointment at 1500. Translation No Nurse Assessment Nurse: Alvis Lemmings, RN, Marcie Bal Date/Time [name] Time): 12/16/2020 9:31:39 AM Confirm and document reason for call. If symptomatic, describe symptoms. ---Caller states she has a sore throat, right earache, and coughing up bloody mucous. Nasal d/c too. She has an appointment at 1500. S/S started on Wed. Has progressively gotten  worse. No SOB. Does the patient have any new or worsening symptoms? ---Yes Will a triage be completed? ---Yes Related visit to physician within the last 2 weeks? ---No Does the PT have any chronic conditions? (i.e. diabetes, asthma, this includes High risk factors for pregnancy, etc.) ---Yes List chronic conditions. ---thyroid, anemia Is the patient pregnant or possibly pregnant? (Ask all females between the ages of 73-55) ---No Is this a behavioral health or substance abuse call? ---No Guidelines Guideline Title Affirmed Question Affirmed Notes Nurse Date/Time (Eastern Time) Coughing Up Blood [1] MILD difficulty breathing (e.g., minimal/no SOB at rest, SOB with walking, pulse <100) AND [2] still present when not coughing [name] 12/16/2020 9:33:36 AM PLEASE NOTE: All timestamps contained within this report are represented as [place] Standard Time. CONFIDENTIALTY NOTICE: This fax transmission is intended only for the addressee. It contains information that is legally privileged, confidential or otherwise protected from use or disclosure. If you are not the intended recipient, you are strictly prohibited from reviewing, disclosing, copying using or disseminating any of this information or taking any action in reliance on or regarding this information. If you have received this fax in error, please notify us immediately by telephone so that we can arrange for its return to [place]. Phone: [phone], Toll-Free: [phone], Fax: [phone] Page: 2 of 2 Call Id: [id number] Guidelines Guideline Title Affirmed Question Affirmed Notes Nurse Date/Time [name] Time) (Exception: no change from usual, chronic shortness of breath) Disp. Time [name] Time) Disposition Final User 12/16/2020 9:36:56 AM See HCP within 4 Hours (or PCP triage) Yes Alvis Lemmings, RN, Lenon Oms Disagree/Comply Comply Caller Understands Yes PreDisposition Call Doctor Care Advice Given Per  Guideline SEE HCP (OR PCP TRIAGE) WITHIN 4 HOURS: * IF OFFICE WILL BE OPEN: You need to be seen within  the next 3 or 4 hours. Call your doctor (or NP/PA) now or as soon as the office opens. CALL BACK IF: * You become worse CARE ADVICE given per Coughing Up Blood guideline. Comments User: Manning Charity, RN Date/Time [name] Time): 12/16/2020 9:48:34 AM [facility] temp. closed due to renovation. Will be seen at [place], [place], [place] per the RN backline. Advised pt if she feels worse before appt. to go to UC or ER. [facility] Referrals Warm transfer to backline

## 2020-12-16 NOTE — Assessment & Plan Note (Signed)
Flu and strep negative continue to monitor over-the-counter regimens as needed for symptom management.

## 2020-12-16 NOTE — Progress Notes (Signed)
Acute Office Visit  Subjective:    Patient ID: Carla Wheeler, female    DOB: [birth year], 45 y.o.   MRN: [id number]  Chief Complaint  Patient presents with   Nasal Congestion    Sx started on 12/11/20-with scratchy throat, achyness, progressed every day. Has hoarse voice, cough-productive with some blood tent to the phlegm, ears feel stopped up, achy. No fever. Has tried OTC Mucinnex, Ibuprofen, hot tea, gargling throat. Covid test negative on 12/11/20    HPI Patient is in today for nasal congestion   Symptoms on 12/11/2020 Covd test on same day was negative Pfizer x2  Has tired OTC mucinex, ibuprofen, hot tea, gargling salt water.    At home covid test was negative on 12/16/2020 Past Medical History:  Diagnosis Date   Asthma    Depression    Thyroid disease     Past Surgical History:  Procedure Laterality Date   CESAREAN SECTION     TUBAL LIGATION  2005    Family History  Problem Relation Age of Onset   Cancer Mother 25       Breast   Arthritis Father    Heart disease Father    Stroke Father    Hypertension Father    Diabetes Father    Congestive Heart Failure Father    Cancer Maternal Grandmother        Breast   Hypertension Maternal Grandmother    Hypertension Paternal Grandmother    Learning disabilities Paternal Grandmother     Social History   Socioeconomic History   Marital status: Married    Spouse name: Not on file   Number of children: Not on file   Years of education: Not on file   Highest education level: Not on file  Occupational History   Not on file  Tobacco Use   Smoking status: Former    Types: Cigars   Smokeless tobacco: Never  Substance and Sexual Activity   Alcohol use: Yes    Alcohol/week: 3.0 standard drinks    Types: 3 Glasses of wine per week    Comment: occasional   Drug use: No   Sexual activity: Yes  Other Topics Concern   Not on file  Social History Narrative   Not on file   Social Determinants of Health    Financial Resource Strain: Not on file  Food Insecurity: Not on file  Transportation Needs: Not on file  Physical Activity: Not on file  Stress: Not on file  Social Connections: Not on file  Intimate Partner Violence: Not on file    Outpatient Medications Prior to Visit  Medication Sig Dispense Refill   albuterol (VENTOLIN HFA) 108 (90 Base) MCG/ACT inhaler TAKE 2 PUFFS BY MOUTH EVERY 6 HOURS AS NEEDED FOR WHEEZE OR SHORTNESS OF BREATH (Patient taking differently: Inhale 2 puffs into the lungs every 6 (six) hours as needed for wheezing or shortness of breath.) 8.5 each 5   docusate sodium (COLACE) 100 MG capsule Take 100 mg by mouth daily.     ferrous sulfate 325 (65 FE) MG tablet Take 325 mg by mouth daily with breakfast.     levothyroxine (SYNTHROID) 125 MCG tablet TAKE 1 TABLET BY MOUTH EVERY DAY BEFORE BREAKFAST 90 tablet 0   No facility-administered medications prior to visit.    Allergies  Allergen Reactions   Augmentin [Amoxicillin-Pot Clavulanate] Other (See Comments)    Muscle cramps    Review of Systems  Constitutional:  Positive for fatigue. Negative for  chills and fever.  HENT:  Positive for congestion, ear pain (right), rhinorrhea and sore throat.   Respiratory:  Positive for cough (yellowish and bloody). Negative for shortness of breath.   Cardiovascular:  Negative for chest pain.  Gastrointestinal:  Negative for abdominal pain, diarrhea, nausea and vomiting.  Musculoskeletal:  Positive for myalgias.  Neurological:  Negative for headaches.      Objective:    Physical Exam Vitals and nursing note reviewed.  Constitutional:      Appearance: Normal appearance.  HENT:     Right Ear: Tympanic membrane, ear canal and external ear normal. There is no impacted cerumen.     Left Ear: Tympanic membrane, ear canal and external ear normal. There is no impacted cerumen.     Nose:     Right Sinus: No maxillary sinus tenderness or frontal sinus tenderness.     Left  Sinus: No maxillary sinus tenderness or frontal sinus tenderness.     Mouth/Throat:     Mouth: Mucous membranes are moist.     Pharynx: No posterior oropharyngeal erythema.  Eyes:     Extraocular Movements: Extraocular movements intact.     Pupils: Pupils are equal, round, and reactive to light.  Neck:     Thyroid: No thyroid mass, thyromegaly or thyroid tenderness.  Cardiovascular:     Rate and Rhythm: Normal rate and regular rhythm.  Pulmonary:     Effort: Pulmonary effort is normal.     Breath sounds: Normal breath sounds.  Abdominal:     General: Bowel sounds are normal.  Lymphadenopathy:     Cervical: Cervical adenopathy present.  Neurological:     Mental Status: She is alert.  Psychiatric:        Mood and Affect: Mood normal.        Behavior: Behavior normal.        Thought Content: Thought content normal.        Judgment: Judgment normal.    BP 132/84   Pulse 68   Temp 98 F (36.7 C)   Ht 5' 0.5" (1.537 m)   Wt 205 lb (93 kg)   LMP 12/09/2020   SpO2 98%   BMI 39.38 kg/m  Wt Readings from Last 3 Encounters:  12/16/20 205 lb (93 kg)  11/04/20 202 lb 6 oz (91.8 kg)  02/23/20 196 lb (88.9 kg)    Health Maintenance Due  Topic Date Due   Pneumococcal Vaccine 64-31 Years old (1 - PCV) Never done   HIV Screening  Never done   Hepatitis C Screening  Never done   COLONOSCOPY (Pts 45-75yrs Insurance coverage will need to be confirmed)  Never done   COVID-19 Vaccine (4 - Booster for [place] series) 06/28/2019   PAP SMEAR-Modifier  10/01/2020   MAMMOGRAM  10/31/2020    There are no preventive care reminders to display for this patient.   Lab Results  Component Value Date   TSH 6.98 (H) 11/04/2020   Lab Results  Component Value Date   WBC 6.1 11/04/2020   HGB 9.5 (L) 11/04/2020   HCT 30.8 (L) 11/04/2020   MCV 67.8 Repeated and verified X2. (L) 11/04/2020   PLT 333.0 11/04/2020   Lab Results  Component Value Date   NA 137 11/04/2020   K 4.5 11/04/2020    CO2 26 11/04/2020   GLUCOSE 81 11/04/2020   BUN 14 11/04/2020   CREATININE 0.87 11/04/2020   BILITOT 0.2 11/04/2020   ALKPHOS 47 11/04/2020   AST 13  11/04/2020   ALT 11 11/04/2020   PROT 7.2 11/04/2020   ALBUMIN 4.3 11/04/2020   CALCIUM 8.9 11/04/2020   ANIONGAP 11 02/18/2020   GFR 79.67 11/04/2020   Lab Results  Component Value Date   CHOL 186 11/04/2020   Lab Results  Component Value Date   HDL 47.30 11/04/2020   Lab Results  Component Value Date   LDLCALC 117 (H) 11/04/2020   Lab Results  Component Value Date   TRIG 113.0 11/04/2020   Lab Results  Component Value Date   CHOLHDL 4 11/04/2020   Lab Results  Component Value Date   HGBA1C 6.1 11/04/2020       Assessment & Plan:   Problem List Items Addressed This Visit       Respiratory   Viral pharyngitis - Primary    Flu and strep test negative in office.  Patient will take a home COVID test when she gets home to make sure were not missing something.  Given length of symptoms in nature likely viral illness.  Did review over-the-counter regimens and the possibility of adding steroids to help with the laryngitis and sore throat.  Pending at home COVID test.      Laryngitis    Patient is having loss of voice.  With sore throat.  Testing in office negative pending at home COVID test.  Continue to monitor we will likely send in steroids to help with voice loss and sore throat        Other   Acute cough   Body aches    Flu and strep negative continue to monitor over-the-counter regimens as needed for symptom management.      Relevant Orders   Influenza A/B (Completed)     No orders of the defined types were placed in this encounter.  This visit occurred during the SARS-CoV-2 public health emergency.  Safety protocols were in place, including screening questions prior to the visit, additional usage of staff PPE, and extensive cleaning of exam room while observing appropriate contact time as indicated for  disinfecting solutions.   Romilda Garret, NP

## 2020-12-16 NOTE — Patient Instructions (Signed)
Nice to see you today Take an at home covid test for me and send me the results via my chart please If that is negative will send in some steroids for your throat

## 2020-12-16 NOTE — Telephone Encounter (Signed)
Noted. Will see patient at office visit.   Thanks

## 2020-12-17 ENCOUNTER — Telehealth: Payer: Self-pay | Admitting: Nurse Practitioner

## 2020-12-17 ENCOUNTER — Encounter: Payer: Self-pay | Admitting: Nurse Practitioner

## 2020-12-17 MED ORDER — PREDNISONE 20 MG PO TABS: ORAL_TABLET | ORAL | 0 refills | Status: AC

## 2020-12-18 ENCOUNTER — Other Ambulatory Visit: Payer: Self-pay | Admitting: Nurse Practitioner

## 2020-12-18 DIAGNOSIS — D508 Other iron deficiency anemias: Secondary | ICD-10-CM

## 2020-12-18 NOTE — Telephone Encounter (Signed)
Spoke with [facility] and was advised that patient picked up Prednisone yesterday. This message was sent to Support pool at 2 pm yesterday and just now got sent to me this morning

## 2020-12-20 ENCOUNTER — Encounter: Payer: Self-pay | Admitting: Nurse Practitioner

## 2020-12-31 ENCOUNTER — Other Ambulatory Visit: Payer: Self-pay

## 2020-12-31 ENCOUNTER — Inpatient Hospital Stay: Payer: BC Managed Care – PPO | Attending: Oncology | Admitting: Oncology

## 2020-12-31 ENCOUNTER — Inpatient Hospital Stay: Payer: BC Managed Care – PPO

## 2020-12-31 ENCOUNTER — Encounter: Payer: Self-pay | Admitting: Oncology

## 2020-12-31 ENCOUNTER — Other Ambulatory Visit: Payer: Self-pay | Admitting: Oncology

## 2020-12-31 VITALS — BP 128/62 | HR 70 | Temp 97.0°F | Resp 16 | Ht 62.0 in | Wt 210.0 lb

## 2020-12-31 DIAGNOSIS — N920 Excessive and frequent menstruation with regular cycle: Secondary | ICD-10-CM | POA: Insufficient documentation

## 2020-12-31 DIAGNOSIS — D509 Iron deficiency anemia, unspecified: Secondary | ICD-10-CM

## 2020-12-31 DIAGNOSIS — Z87891 Personal history of nicotine dependence: Secondary | ICD-10-CM | POA: Diagnosis not present

## 2020-12-31 DIAGNOSIS — D508 Other iron deficiency anemias: Secondary | ICD-10-CM | POA: Diagnosis present

## 2020-12-31 DIAGNOSIS — R5381 Other malaise: Secondary | ICD-10-CM | POA: Insufficient documentation

## 2020-12-31 DIAGNOSIS — Z803 Family history of malignant neoplasm of breast: Secondary | ICD-10-CM | POA: Insufficient documentation

## 2020-12-31 DIAGNOSIS — R5383 Other fatigue: Secondary | ICD-10-CM | POA: Diagnosis not present

## 2020-12-31 LAB — CBC WITH DIFFERENTIAL/PLATELET
Abs Immature Granulocytes: 0.01 10*3/uL (ref 0.00–0.07)
Basophils Absolute: 0.1 10*3/uL (ref 0.0–0.1)
Basophils Relative: 1 %
Eosinophils Absolute: 0.3 10*3/uL (ref 0.0–0.5)
Eosinophils Relative: 5 %
HCT: 30.8 % — ABNORMAL LOW (ref 36.0–46.0)
Hemoglobin: 8.8 g/dL — ABNORMAL LOW (ref 12.0–15.0)
Immature Granulocytes: 0 %
Lymphocytes Relative: 32 %
Lymphs Abs: 2.1 10*3/uL (ref 0.7–4.0)
MCH: 19.3 pg — ABNORMAL LOW (ref 26.0–34.0)
MCHC: 28.6 g/dL — ABNORMAL LOW (ref 30.0–36.0)
MCV: 67.7 fL — ABNORMAL LOW (ref 80.0–100.0)
Monocytes Absolute: 0.7 10*3/uL (ref 0.1–1.0)
Monocytes Relative: 11 %
Neutro Abs: 3.4 10*3/uL (ref 1.7–7.7)
Neutrophils Relative %: 51 %
Platelets: 393 10*3/uL (ref 150–400)
RBC: 4.55 MIL/uL (ref 3.87–5.11)
RDW: 19.8 % — ABNORMAL HIGH (ref 11.5–15.5)
WBC: 6.7 10*3/uL (ref 4.0–10.5)
nRBC: 0 % (ref 0.0–0.2)

## 2020-12-31 LAB — IRON AND TIBC
Iron: 19 ug/dL — ABNORMAL LOW (ref 28–170)
Saturation Ratios: 4 % — ABNORMAL LOW (ref 10.4–31.8)
TIBC: 477 ug/dL — ABNORMAL HIGH (ref 250–450)
UIBC: 458 ug/dL

## 2020-12-31 LAB — FERRITIN: Ferritin: 3 ng/mL — ABNORMAL LOW (ref 11–307)

## 2020-12-31 LAB — FOLATE: Folate: 12 ng/mL (ref >5.9)

## 2020-12-31 LAB — VITAMIN B12: Vitamin B-12: 495 pg/mL (ref 180–914)

## 2020-12-31 LAB — TSH: TSH: 7.966 u[IU]/mL — ABNORMAL HIGH (ref 0.350–4.500)

## 2020-12-31 NOTE — Progress Notes (Signed)
Hematology/Oncology Consult note [facility] Telephone:([phone] [phone]) [phone]  Patient Care Team: Michela Pitcher, NP as PCP - General   Name of the patient: Carla Wheeler  [id number]  Aug 07, 1975    Reason for referral-iron deficiency anemia   Referring physician-James Charmian Muff, NP  Date of visit: 12/31/20   History of presenting illness-patient is a 45 year old African-American female with a past medical history significant for hypothyroidism, anxiety and depression referred for iron deficiency anemia.  Most recently her hemoglobin was 9.5 in September 2022 with ferritin levels that were low at 3.6 and elevated TIBC of 487.  Of note patient has had chronic iron deficiency anemia and her hemoglobin has been mostly around 10.  She does report heavy menstrual cycles for which she has seen GYN in the past and also was taking birth control and Depo shots previously but that has not helped her.  She has tried taking oral iron but it makes her nauseous and constipated.  Denies any blood loss in her stool.  Reports dark stools when she takes oral iron treatment.  Family history significant for breast cancer in her mother and paternal grandmother.  She has never had a colonoscopy.  Denies any consistent use of NSAIDs.  Currently reports ongoing fatigue and occasionally feels lightheaded.  ECOG PS- 1  Pain scale- 0   Review of systems- Review of Systems  Constitutional:  Positive for malaise/fatigue. Negative for chills, fever and weight loss.  HENT:  Negative for congestion, ear discharge and nosebleeds.   Eyes:  Negative for blurred vision.  Respiratory:  Negative for cough, hemoptysis, sputum production, shortness of breath and wheezing.   Cardiovascular:  Negative for chest pain, palpitations, orthopnea and claudication.  Gastrointestinal:  Negative for abdominal pain, blood in stool, constipation, diarrhea, heartburn, melena, nausea and vomiting.   Genitourinary:  Negative for dysuria, flank pain, frequency, hematuria and urgency.  Musculoskeletal:  Negative for back pain, joint pain and myalgias.  Skin:  Negative for rash.  Neurological:  Negative for dizziness, tingling, focal weakness, seizures, weakness and headaches.  Endo/Heme/Allergies:  Does not bruise/bleed easily.  Psychiatric/Behavioral:  Negative for depression and suicidal ideas. The patient does not have insomnia.    Allergies  Allergen Reactions   Augmentin [Amoxicillin-Pot Clavulanate] Other (See Comments)    Muscle cramps    Patient Active Problem List   Diagnosis Date Noted   Viral pharyngitis 12/16/2020   Acute cough 12/16/2020   Body aches 12/16/2020   Laryngitis 12/16/2020   Obesity (BMI 35.0-39.9 without comorbidity) 11/04/2020   Paresthesia 11/04/2020   Anemia, iron deficiency 07/19/2015   Mild intermittent asthma with acute exacerbation 07/19/2015   Hypothyroidism 07/21/2013   Anxiety and depression 07/15/2011     Past Medical History:  Diagnosis Date   Asthma    Depression    Thyroid disease      Past Surgical History:  Procedure Laterality Date   CESAREAN SECTION     TUBAL LIGATION  2005    Social History   Socioeconomic History   Marital status: Married    Spouse name: Not on file   Number of children: Not on file   Years of education: Not on file   Highest education level: Not on file  Occupational History   Not on file  Tobacco Use   Smoking status: Former    Types: Cigars   Smokeless tobacco: Never  Substance and Sexual Activity   Alcohol use: Yes    Alcohol/week: 3.0 standard  drinks    Types: 3 Glasses of wine per week    Comment: occasional   Drug use: No   Sexual activity: Yes  Other Topics Concern   Not on file  Social History Narrative   Not on file   Social Determinants of Health   Financial Resource Strain: Not on file  Food Insecurity: Not on file  Transportation Needs: Not on file  Physical  Activity: Not on file  Stress: Not on file  Social Connections: Not on file  Intimate Partner Violence: Not on file     Family History  Problem Relation Age of Onset   Cancer Mother 110       Breast   Arthritis Father    Heart disease Father    Stroke Father    Hypertension Father    Diabetes Father    Congestive Heart Failure Father    Cancer Maternal Grandmother        Breast   Hypertension Maternal Grandmother    Hypertension Paternal Grandmother    Learning disabilities Paternal Grandmother      Current Outpatient Medications:    albuterol (VENTOLIN HFA) 108 (90 Base) MCG/ACT inhaler, TAKE 2 PUFFS BY MOUTH EVERY 6 HOURS AS NEEDED FOR WHEEZE OR SHORTNESS OF BREATH, Disp: 8.5 each, Rfl: 5   docusate sodium (COLACE) 100 MG capsule, Take 100 mg by mouth daily., Disp: , Rfl:    ferrous sulfate 325 (65 FE) MG tablet, Take 325 mg by mouth daily with breakfast., Disp: , Rfl:    levothyroxine (SYNTHROID) 125 MCG tablet, TAKE 1 TABLET BY MOUTH EVERY DAY BEFORE BREAKFAST, Disp: 90 tablet, Rfl: 0   Physical exam:  Vitals:   12/31/20 1530  BP: 128/62  Pulse: 70  Resp: 16  Temp: (!) 97 F (36.1 C)  SpO2: 99%  Weight: 210 lb (95.3 kg)  Height: 5\' 2"  (1.575 m)   Physical Exam Constitutional:      General: She is not in acute distress. Cardiovascular:     Rate and Rhythm: Normal rate and regular rhythm.     Heart sounds: Normal heart sounds.  Pulmonary:     Effort: Pulmonary effort is normal.     Breath sounds: Normal breath sounds.  Abdominal:     General: Bowel sounds are normal.     Palpations: Abdomen is soft.  Skin:    General: Skin is warm and dry.  Neurological:     Mental Status: She is alert and oriented to person, place, and time.       CMP Latest Ref Rng & Units 11/04/2020  Glucose 70 - 99 mg/dL 81  BUN 6 - 23 mg/dL 14  Creatinine 0.40 - 1.20 mg/dL 0.87  Sodium 135 - 145 mEq/L 137  Potassium 3.5 - 5.1 mEq/L 4.5  Chloride 96 - 112 mEq/L 104  CO2 19 -  32 mEq/L 26  Calcium 8.4 - 10.5 mg/dL 8.9  Total Protein 6.0 - 8.3 g/dL 7.2  Total Bilirubin 0.2 - 1.2 mg/dL 0.2  Alkaline Phos 39 - 117 U/L 47  AST 0 - 37 U/L 13  ALT 0 - 35 U/L 11   CBC Latest Ref Rng & Units 11/04/2020  WBC 4.0 - 10.5 K/uL 6.1  Hemoglobin 12.0 - 15.0 g/dL 9.5(L)  Hematocrit 36.0 - 46.0 % 30.8(L)  Platelets 150.0 - 400.0 K/uL 333.0   Assessment and plan- Patient is a 45 y.o. female referred for iron deficiency anemia  Patient had evidence of microcytic iron deficiency  anemia based on her labs in September 2022.  We will repeat CBC ferritin and iron studies B12 folate TSH today.  Since she is unable to tolerate oral iron she will need IV iron.  Discussed risks and benefits of both Feraheme and Venofer including all but not limited to possible risk of infusion reaction and anaphylactic reactions.  We will see what her insurance will approve and give her IV iron accordingly.  I will see her back in 6 weeks with repeat CBC ferritin and iron studies.  Etiology of iron deficiency anemia likely secondary to menorrhagia and I have encouraged the patient to go and see GYN again.  Without correcting her menorrhagia it is unlikely that her iron deficiency anemia will be correctable and she will constantly require IV iron from time to time.  She patient also has never had a colonoscopy and she is more than 45 years of age.  I therefore will refer her to gastroenterology as well.   Thank you for this kind referral and the opportunity to participate in the care of this patient   Visit Diagnosis 1. Iron deficiency anemia, unspecified iron deficiency anemia type     Dr. Randa Evens, MD, MPH [facility] at [facility] [phone] 12/31/2020

## 2021-01-04 ENCOUNTER — Other Ambulatory Visit: Payer: Self-pay | Admitting: Internal Medicine

## 2021-01-04 ENCOUNTER — Other Ambulatory Visit: Payer: Self-pay | Admitting: Nurse Practitioner

## 2021-01-06 ENCOUNTER — Encounter: Payer: Self-pay | Admitting: Nurse Practitioner

## 2021-01-06 MED ORDER — DOCUSATE SODIUM 100 MG PO CAPS: 100.0000 mg | ORAL_CAPSULE | Freq: Every day | ORAL | 0 refills | Status: DC

## 2021-01-06 MED ORDER — LEVOTHYROXINE SODIUM 125 MCG PO TABS: 125.0000 ug | ORAL_TABLET | Freq: Every day | ORAL | 1 refills | Status: DC

## 2021-01-15 ENCOUNTER — Inpatient Hospital Stay: Payer: BC Managed Care – PPO | Attending: Oncology

## 2021-01-15 ENCOUNTER — Other Ambulatory Visit: Payer: Self-pay

## 2021-01-15 VITALS — BP 128/74 | HR 66 | Temp 95.4°F | Resp 20

## 2021-01-15 DIAGNOSIS — D508 Other iron deficiency anemias: Secondary | ICD-10-CM

## 2021-01-15 DIAGNOSIS — D509 Iron deficiency anemia, unspecified: Secondary | ICD-10-CM | POA: Diagnosis not present

## 2021-01-15 MED ORDER — SODIUM CHLORIDE 0.9 % IV SOLN: 200.0000 mg | INTRAVENOUS | Status: DC

## 2021-01-15 MED ORDER — IRON SUCROSE 20 MG/ML IV SOLN
200.0000 mg | Freq: Once | INTRAVENOUS | Status: AC
  Administered 2021-01-15: 200 mg via INTRAVENOUS
  Filled 2021-01-15: qty 10

## 2021-01-15 MED ORDER — SODIUM CHLORIDE 0.9 % IV SOLN
Freq: Once | INTRAVENOUS | Status: AC
  Filled 2021-01-15: qty 250

## 2021-01-15 NOTE — Patient Instructions (Signed)

## 2021-01-17 ENCOUNTER — Other Ambulatory Visit: Payer: Self-pay

## 2021-01-17 ENCOUNTER — Inpatient Hospital Stay: Payer: BC Managed Care – PPO

## 2021-01-17 VITALS — BP 139/75 | HR 64 | Temp 96.7°F | Resp 18

## 2021-01-17 DIAGNOSIS — D509 Iron deficiency anemia, unspecified: Secondary | ICD-10-CM | POA: Diagnosis not present

## 2021-01-17 DIAGNOSIS — D508 Other iron deficiency anemias: Secondary | ICD-10-CM

## 2021-01-17 MED ORDER — IRON SUCROSE 20 MG/ML IV SOLN
200.0000 mg | Freq: Once | INTRAVENOUS | Status: AC
  Administered 2021-01-17: 200 mg via INTRAVENOUS
  Filled 2021-01-17: qty 10

## 2021-01-17 MED ORDER — SODIUM CHLORIDE 0.9 % IV SOLN: 200.0000 mg | INTRAVENOUS | Status: DC

## 2021-01-17 MED ORDER — SODIUM CHLORIDE 0.9 % IV SOLN
Freq: Once | INTRAVENOUS | Status: AC
  Filled 2021-01-17: qty 250

## 2021-01-17 NOTE — Patient Instructions (Signed)
[facility]   ?Discharge Instructions: ?Thank you for choosing [facility] to provide your oncology and hematology care.  ?If you have a lab appointment with the Cancer Center, please go directly to the Cancer Center and check in at the registration area. ?  ?We strive to give you quality time with your provider. You may need to reschedule your appointment if you arrive late (15 or more minutes).  Arriving late affects you and other patients whose appointments are after yours.  Also, if you miss three or more appointments without notifying the office, you may be dismissed from the clinic at the provider?s discretion.    ?  ?For prescription refill requests, have your pharmacy contact our office and allow 72 hours for refills to be completed.   ? ?Today you received the following: Venofer.    ?  ?BELOW ARE SYMPTOMS THAT SHOULD BE REPORTED IMMEDIATELY: ?*FEVER GREATER THAN 100.4 F (38 ?C) OR HIGHER ?*CHILLS OR SWEATING ?*NAUSEA AND VOMITING THAT IS NOT CONTROLLED WITH YOUR NAUSEA MEDICATION ?*UNUSUAL SHORTNESS OF BREATH ?*UNUSUAL BRUISING OR BLEEDING ?*URINARY PROBLEMS (pain or burning when urinating, or frequent urination) ?*BOWEL PROBLEMS (unusual diarrhea, constipation, pain near the anus) ?TENDERNESS IN MOUTH AND THROAT WITH OR WITHOUT PRESENCE OF ULCERS (sore throat, sores in mouth, or a toothache) ?UNUSUAL RASH, SWELLING OR PAIN  ?UNUSUAL VAGINAL DISCHARGE OR ITCHING  ? ?Items with * indicate a potential emergency and should be followed up as soon as possible or go to the Emergency Department if any problems should occur. ? ?Should you have questions after your visit or need to cancel or reschedule your appointment, please contact [facility]  Dept: [phone]  and follow the prompts.  Office hours are 8:00 a.m. to 4:30 p.m. Monday - Friday. Please note that voicemails left after 4:00 p.m. may not be returned until the following  business day.  We are closed weekends and major holidays. You have access to a nurse at all times for urgent questions. Please call the main number to the clinic Dept: [phone] and follow the prompts. ? ?For any non-urgent questions, you may also contact your provider using MyChart. We now offer e-Visits for anyone 18 and older to request care online for non-urgent symptoms. For details visit [email or web address]. ?  ?Also download the MyChart app! Go to the app store, search "MyChart", open the app, select [facility], and log in with your MyChart username and password. ? ?Due to Covid, a mask is required upon entering the hospital/clinic. If you do not have a mask, one will be given to you upon arrival. For doctor visits, patients may have 1 support person aged 18 or older with them. For treatment visits, patients cannot have anyone with them due to current Covid guidelines and our immunocompromised population.  ?

## 2021-01-20 ENCOUNTER — Inpatient Hospital Stay: Payer: BC Managed Care – PPO

## 2021-01-20 ENCOUNTER — Other Ambulatory Visit: Payer: Self-pay

## 2021-01-20 VITALS — BP 135/68 | HR 62 | Temp 97.0°F | Resp 18

## 2021-01-20 DIAGNOSIS — D508 Other iron deficiency anemias: Secondary | ICD-10-CM

## 2021-01-20 DIAGNOSIS — D509 Iron deficiency anemia, unspecified: Secondary | ICD-10-CM | POA: Diagnosis not present

## 2021-01-20 MED ORDER — SODIUM CHLORIDE 0.9 % IV SOLN
Freq: Once | INTRAVENOUS | Status: AC
  Filled 2021-01-20: qty 250

## 2021-01-20 MED ORDER — SODIUM CHLORIDE 0.9 % IV SOLN: 200.0000 mg | INTRAVENOUS | Status: DC

## 2021-01-20 MED ORDER — IRON SUCROSE 20 MG/ML IV SOLN
200.0000 mg | Freq: Once | INTRAVENOUS | Status: AC
  Administered 2021-01-20: 200 mg via INTRAVENOUS
  Filled 2021-01-20: qty 10

## 2021-01-20 NOTE — Patient Instructions (Signed)

## 2021-01-21 ENCOUNTER — Telehealth: Payer: Self-pay

## 2021-01-21 NOTE — Telephone Encounter (Signed)
Left message for patient to call back to go over [employer].

## 2021-01-22 ENCOUNTER — Inpatient Hospital Stay: Payer: BC Managed Care – PPO

## 2021-01-22 ENCOUNTER — Other Ambulatory Visit: Payer: Self-pay

## 2021-01-22 VITALS — BP 152/74 | HR 63 | Temp 98.0°F | Resp 18

## 2021-01-22 DIAGNOSIS — D509 Iron deficiency anemia, unspecified: Secondary | ICD-10-CM | POA: Diagnosis not present

## 2021-01-22 DIAGNOSIS — D508 Other iron deficiency anemias: Secondary | ICD-10-CM

## 2021-01-22 MED ORDER — SODIUM CHLORIDE 0.9 % IV SOLN
Freq: Once | INTRAVENOUS | Status: AC
  Filled 2021-01-22: qty 250

## 2021-01-22 MED ORDER — IRON SUCROSE 20 MG/ML IV SOLN
200.0000 mg | Freq: Once | INTRAVENOUS | Status: AC
  Administered 2021-01-22: 16:00:00 200 mg via INTRAVENOUS
  Filled 2021-01-22: qty 10

## 2021-01-22 MED ORDER — SODIUM CHLORIDE 0.9 % IV SOLN: 200.0000 mg | INTRAVENOUS | Status: DC

## 2021-01-22 NOTE — Patient Instructions (Signed)

## 2021-01-24 NOTE — Telephone Encounter (Signed)
Spoke with patient. Patient did pick up her new thyroid medication prescription. Patient states she was not taking her thyroid medication consistently before hwen she was on iron pills and they made her sick to her stomach. Patient is now on iron infusions and is taking her thyroid daily as directed. Doing a little better with fatigue. She will keep [place] posted as needed. FYI to [facility]

## 2021-01-24 NOTE — Telephone Encounter (Signed)
See mychart message. Spoke to patient

## 2021-01-30 ENCOUNTER — Inpatient Hospital Stay: Payer: BC Managed Care – PPO

## 2021-02-05 ENCOUNTER — Inpatient Hospital Stay: Payer: BC Managed Care – PPO

## 2021-02-05 ENCOUNTER — Other Ambulatory Visit: Payer: Self-pay

## 2021-02-05 VITALS — BP 147/89 | HR 59 | Temp 98.2°F | Resp 16

## 2021-02-05 DIAGNOSIS — D509 Iron deficiency anemia, unspecified: Secondary | ICD-10-CM | POA: Diagnosis not present

## 2021-02-05 DIAGNOSIS — D508 Other iron deficiency anemias: Secondary | ICD-10-CM

## 2021-02-05 MED ORDER — SODIUM CHLORIDE 0.9 % IV SOLN: 200.0000 mg | INTRAVENOUS | Status: DC

## 2021-02-05 MED ORDER — IRON SUCROSE 20 MG/ML IV SOLN
200.0000 mg | Freq: Once | INTRAVENOUS | Status: AC
  Administered 2021-02-05: 15:00:00 200 mg via INTRAVENOUS
  Filled 2021-02-05: qty 10

## 2021-02-05 MED ORDER — SODIUM CHLORIDE 0.9 % IV SOLN
Freq: Once | INTRAVENOUS | Status: AC
  Filled 2021-02-05: qty 250

## 2021-02-05 NOTE — Patient Instructions (Signed)

## 2021-02-19 ENCOUNTER — Other Ambulatory Visit: Payer: Self-pay | Admitting: *Deleted

## 2021-02-19 DIAGNOSIS — D509 Iron deficiency anemia, unspecified: Secondary | ICD-10-CM

## 2021-02-24 ENCOUNTER — Inpatient Hospital Stay: Payer: BC Managed Care – PPO

## 2021-02-25 ENCOUNTER — Telehealth: Payer: Self-pay | Admitting: Oncology

## 2021-02-25 ENCOUNTER — Other Ambulatory Visit: Payer: Self-pay

## 2021-02-25 ENCOUNTER — Inpatient Hospital Stay: Payer: BC Managed Care – PPO | Attending: Oncology

## 2021-02-25 DIAGNOSIS — D509 Iron deficiency anemia, unspecified: Secondary | ICD-10-CM | POA: Insufficient documentation

## 2021-02-25 LAB — CBC WITH DIFFERENTIAL/PLATELET
Abs Immature Granulocytes: 0.01 10*3/uL (ref 0.00–0.07)
Basophils Absolute: 0 10*3/uL (ref 0.0–0.1)
Basophils Relative: 1 %
Eosinophils Absolute: 0.4 10*3/uL (ref 0.0–0.5)
Eosinophils Relative: 7 %
HCT: 40.5 % (ref 36.0–46.0)
Hemoglobin: 12.5 g/dL (ref 12.0–15.0)
Immature Granulocytes: 0 %
Lymphocytes Relative: 41 %
Lymphs Abs: 2.3 10*3/uL (ref 0.7–4.0)
MCH: 24.2 pg — ABNORMAL LOW (ref 26.0–34.0)
MCHC: 30.9 g/dL (ref 30.0–36.0)
MCV: 78.3 fL — ABNORMAL LOW (ref 80.0–100.0)
Monocytes Absolute: 0.5 10*3/uL (ref 0.1–1.0)
Monocytes Relative: 8 %
Neutro Abs: 2.4 10*3/uL (ref 1.7–7.7)
Neutrophils Relative %: 43 %
Platelets: 295 10*3/uL (ref 150–400)
RBC: 5.17 MIL/uL — ABNORMAL HIGH (ref 3.87–5.11)
RDW: 24.8 % — ABNORMAL HIGH (ref 11.5–15.5)
WBC: 5.6 10*3/uL (ref 4.0–10.5)
nRBC: 0 % (ref 0.0–0.2)

## 2021-02-25 LAB — IRON AND TIBC
Iron: 58 ug/dL (ref 28–170)
Saturation Ratios: 17 % (ref 10.4–31.8)
TIBC: 350 ug/dL (ref 250–450)
UIBC: 292 ug/dL

## 2021-02-25 LAB — FERRITIN: Ferritin: 42 ng/mL (ref 11–307)

## 2021-02-25 NOTE — Telephone Encounter (Signed)
Pt called and is unable to come to her appt on 1-18 but could come today. Call back at [phone]

## 2021-02-26 ENCOUNTER — Inpatient Hospital Stay: Payer: BC Managed Care – PPO

## 2021-02-26 ENCOUNTER — Inpatient Hospital Stay: Payer: BC Managed Care – PPO | Admitting: Oncology

## 2021-02-27 ENCOUNTER — Inpatient Hospital Stay (HOSPITAL_BASED_OUTPATIENT_CLINIC_OR_DEPARTMENT_OTHER): Payer: BC Managed Care – PPO | Admitting: Oncology

## 2021-02-27 DIAGNOSIS — D509 Iron deficiency anemia, unspecified: Secondary | ICD-10-CM

## 2021-02-27 NOTE — Progress Notes (Signed)
Hematology/Oncology Consult note [facility] Telephone:([phone] Fax:([phone]  Patient Care Team: Michela Pitcher, NP as PCP - General   Name of the patient: Carla Wheeler  [id number]  [birth year]    Reason for referral-iron deficiency anemia   Referring physician-James Charmian Muff, NP  Date of visit: 02/27/21  I connected with Murray Hodgkins on 02/27/21 at  3:00 PM EST by video enabled telemedicine visit and verified that I am speaking with the correct person using two identifiers.   I discussed the limitations, risks, security and privacy concerns of performing an evaluation and management service by telemedicine and the availability of in-person appointments. I also discussed with the patient that there may be a patient responsible charge related to this service. The patient expressed understanding and agreed to proceed.   Other persons participating in the visit and their role in the encounter: NP, Patient    Patients location: Home  Providers location: Clinic    History of presenting illness-patient is a 46 year old African-American female with a past medical history significant for hypothyroidism, anxiety and depression referred for iron deficiency anemia.  Most recently her hemoglobin was 9.5 in September 2022 with ferritin levels that were low at 3.6 and elevated TIBC of 487.  Of note patient has had chronic iron deficiency anemia and her hemoglobin has been mostly around 10.  She does report heavy menstrual cycles for which she has seen GYN in the past and also was taking birth control and Depo shots previously but that has not helped her.  She has tried taking oral iron but it makes her nauseous and constipated.  Denies any blood loss in her stool.  Reports dark stools when she takes oral iron treatment.  Family history significant for breast cancer in her mother and paternal grandmother.  She has never had a colonoscopy.  Denies any consistent  use of NSAIDs.  Currently reports ongoing fatigue and occasionally feels lightheaded.  Interval history- Received 5 doses of IV Venofer between 01/15/21-02/05/21. Here to review labs and see how she tolerated iron infusions. Feels much better and less lethargic. No additional aches and pains. Has an appt with GI next week to discuss possible colonoscopy. Has extremely heavy menstrual cycles that last approximately 5 days.  Is followed by gynecologist and has tried several oral medications, injections and freezing of the cervix.  Last resort is a hysterectomy.    ECOG PS- 1  Pain scale- 0  Review of systems- Review of Systems  Constitutional: Negative.  Negative for chills, fever, malaise/fatigue and weight loss.  HENT:  Negative for congestion, ear pain and tinnitus.   Eyes: Negative.  Negative for blurred vision and double vision.  Respiratory: Negative.  Negative for cough, sputum production and shortness of breath.   Cardiovascular: Negative.  Negative for chest pain, palpitations and leg swelling.  Gastrointestinal: Negative.  Negative for abdominal pain, constipation, diarrhea, nausea and vomiting.  Genitourinary:  Negative for dysuria, frequency and urgency.  Musculoskeletal:  Negative for back pain and falls.  Skin: Negative.  Negative for rash.  Neurological: Negative.  Negative for weakness and headaches.  Endo/Heme/Allergies: Negative.  Does not bruise/bleed easily.  Psychiatric/Behavioral: Negative.  Negative for depression. The patient is not nervous/anxious and does not have insomnia.    Allergies  Allergen Reactions   Augmentin [Amoxicillin-Pot Clavulanate] Other (See Comments)    Muscle cramps    Patient Active Problem List   Diagnosis Date Noted   Viral pharyngitis 12/16/2020  Acute cough 12/16/2020   Body aches 12/16/2020   Laryngitis 12/16/2020   Obesity (BMI 35.0-39.9 without comorbidity) 11/04/2020   Paresthesia 11/04/2020   Anemia, iron deficiency 07/19/2015    Mild intermittent asthma with acute exacerbation 07/19/2015   Hypothyroidism 07/21/2013   Anxiety and depression 07/15/2011     Past Medical History:  Diagnosis Date   Asthma    Depression    Thyroid disease      Past Surgical History:  Procedure Laterality Date   CESAREAN SECTION     TUBAL LIGATION  2005    Social History   Socioeconomic History   Marital status: Married    Spouse name: Not on file   Number of children: Not on file   Years of education: Not on file   Highest education level: Not on file  Occupational History   Not on file  Tobacco Use   Smoking status: Former    Types: Cigars   Smokeless tobacco: Never  Substance and Sexual Activity   Alcohol use: Yes    Alcohol/week: 3.0 standard drinks    Types: 3 Glasses of wine per week    Comment: occasional   Drug use: No   Sexual activity: Yes  Other Topics Concern   Not on file  Social History Narrative   Not on file   Social Determinants of Health   Financial Resource Strain: Not on file  Food Insecurity: Not on file  Transportation Needs: Not on file  Physical Activity: Not on file  Stress: Not on file  Social Connections: Not on file  Intimate Partner Violence: Not on file     Family History  Problem Relation Age of Onset   Cancer Mother 54       Breast   Arthritis Father    Heart disease Father    Stroke Father    Hypertension Father    Diabetes Father    Congestive Heart Failure Father    Cancer Maternal Grandmother        Breast   Hypertension Maternal Grandmother    Hypertension Paternal Grandmother    Learning disabilities Paternal Grandmother      Current Outpatient Medications:    albuterol (VENTOLIN HFA) 108 (90 Base) MCG/ACT inhaler, TAKE 2 PUFFS BY MOUTH EVERY 6 HOURS AS NEEDED FOR WHEEZE OR SHORTNESS OF BREATH, Disp: 8.5 each, Rfl: 5   docusate sodium (COLACE) 100 MG capsule, Take 1 capsule (100 mg total) by mouth daily., Disp: 10 capsule, Rfl: 0   ferrous sulfate  325 (65 FE) MG tablet, Take 325 mg by mouth daily with breakfast., Disp: , Rfl:    levothyroxine (SYNTHROID) 125 MCG tablet, Take 1 tablet (125 mcg total) by mouth daily before breakfast., Disp: 90 tablet, Rfl: 1   Physical exam:  There were no vitals filed for this visit.  Physical Exam Constitutional:      Appearance: Normal appearance. She is obese.  Pulmonary:     Effort: Pulmonary effort is normal.  Neurological:     Mental Status: She is alert and oriented to person, place, and time.       CMP Latest Ref Rng & Units 11/04/2020  Glucose 70 - 99 mg/dL 81  BUN 6 - 23 mg/dL 14  Creatinine 0.40 - 1.20 mg/dL 0.87  Sodium 135 - 145 mEq/L 137  Potassium 3.5 - 5.1 mEq/L 4.5  Chloride 96 - 112 mEq/L 104  CO2 19 - 32 mEq/L 26  Calcium 8.4 - 10.5 mg/dL 8.9  Total Protein 6.0 - 8.3 g/dL 7.2  Total Bilirubin 0.2 - 1.2 mg/dL 0.2  Alkaline Phos 39 - 117 U/L 47  AST 0 - 37 U/L 13  ALT 0 - 35 U/L 11   CBC Latest Ref Rng & Units 02/25/2021  WBC 4.0 - 10.5 K/uL 5.6  Hemoglobin 12.0 - 15.0 g/dL 12.5  Hematocrit 36.0 - 46.0 % 40.5  Platelets 150 - 400 K/uL 295   Assessment and plan- Patient is a 46 y.o. female who is here for follow-up for iron deficiency anemia.  Iron deficiency anemia- Secondary to heavy menstrual cycles. Received 5 doses of IV Venofer last month-tolerated well. Unable to tolerate oral iron secondary to GI upset. Has not had colonoscopy but is scheduled to see GI next week. Followed by gynecology and has received several medications, injections and procedures without improvement of her heavy cycles. Next step is hysterectomy. Reviewed labs from 02/25/2021 which showed a ferritin of 42 and iron saturation of 17%.  Hemoglobin is 12.5 and MCV 78.3. Recommend she return to clinic in 6 weeks for follow-up with lab work, see Dr. Janese Banks and likely IV iron.  She is in agreement.  Disposition- Return in 6 weeks for follow-up with lab work and possible IV iron.  Appointment  needs to be in the afternoon after 3 PM and cannot be on Tuesdays.  I provided 20 minutes of face-to-face video visit time during this encounter, and > 50% was spent counseling as documented under my assessment & plan.   Faythe Casa, NP 02/27/2021 3:37 PM

## 2021-03-03 ENCOUNTER — Other Ambulatory Visit: Payer: Self-pay

## 2021-03-03 ENCOUNTER — Encounter: Payer: Self-pay | Admitting: Gastroenterology

## 2021-03-03 ENCOUNTER — Ambulatory Visit (INDEPENDENT_AMBULATORY_CARE_PROVIDER_SITE_OTHER): Payer: BC Managed Care – PPO | Admitting: Gastroenterology

## 2021-03-03 VITALS — BP 144/74 | HR 79 | Temp 99.1°F | Ht 62.0 in | Wt 209.4 lb

## 2021-03-03 DIAGNOSIS — D500 Iron deficiency anemia secondary to blood loss (chronic): Secondary | ICD-10-CM | POA: Diagnosis not present

## 2021-03-03 DIAGNOSIS — K5904 Chronic idiopathic constipation: Secondary | ICD-10-CM | POA: Diagnosis not present

## 2021-03-03 DIAGNOSIS — Z1211 Encounter for screening for malignant neoplasm of colon: Secondary | ICD-10-CM | POA: Diagnosis not present

## 2021-03-03 MED ORDER — NA SULFATE-K SULFATE-MG SULF 17.5-3.13-1.6 GM/177ML PO SOLN: 354.0000 mL | Freq: Once | ORAL | 0 refills | Status: AC

## 2021-03-03 NOTE — Patient Instructions (Signed)
High-Fiber Eating Plan °Fiber, also called dietary fiber, is a type of carbohydrate. It is found foods such as fruits, vegetables, whole grains, and beans. A high-fiber diet can have many health benefits. Your health care provider may recommend a high-fiber diet to help: °Prevent constipation. Fiber can make your bowel movements more regular. °Lower your cholesterol. °Relieve the following conditions: °Inflammation of veins in the anus (hemorrhoids). °Inflammation of specific areas of the digestive tract (uncomplicated diverticulosis). °A problem of the large intestine, also called the colon, that sometimes causes pain and diarrhea (irritable bowel syndrome, or IBS). °Prevent overeating as part of a weight-loss plan. °Prevent heart disease, type 2 diabetes, and certain cancers. °What are tips for following this plan? °Reading food labels ° °Check the nutrition facts label on food products for the amount of dietary fiber. Choose foods that have 5 grams of fiber or more per serving. °The goals for recommended daily fiber intake include: °Men (age 50 or younger): 34-38 g. °Men (over age 50): 28-34 g. °Women (age 50 or younger): 25-28 g. °Women (over age 50): 22-25 g. °Your daily fiber goal is _____________ g. °Shopping °Choose whole fruits and vegetables instead of processed forms, such as apple juice or applesauce. °Choose a wide variety of high-fiber foods such as avocados, lentils, oats, and kidney beans. °Read the nutrition facts label of the foods you choose. Be aware of foods with added fiber. These foods often have high sugar and sodium amounts per serving. °Cooking °Use whole-grain flour for baking and cooking. °Cook with brown rice instead of white rice. °Meal planning °Start the day with a breakfast that is high in fiber, such as a cereal that contains 5 g of fiber or more per serving. °Eat breads and cereals that are made with whole-grain flour instead of refined flour or white flour. °Eat brown rice, bulgur  wheat, or millet instead of white rice. °Use beans in place of meat in soups, salads, and pasta dishes. °Be sure that half of the grains you eat each day are whole grains. °General information °You can get the recommended daily intake of dietary fiber by: °Eating a variety of fruits, vegetables, grains, nuts, and beans. °Taking a fiber supplement if you are not able to take in enough fiber in your diet. It is better to get fiber through food than from a supplement. °Gradually increase how much fiber you consume. If you increase your intake of dietary fiber too quickly, you may have bloating, cramping, or gas. °Drink plenty of water to help you digest fiber. °Choose high-fiber snacks, such as berries, raw vegetables, nuts, and popcorn. °What foods should I eat? °Fruits °Berries. Pears. Apples. Oranges. Avocado. Prunes and raisins. Dried figs. °Vegetables °Sweet potatoes. Spinach. Kale. Artichokes. Cabbage. Broccoli. Cauliflower. Green peas. Carrots. Squash. °Grains °Whole-grain breads. Multigrain cereal. Oats and oatmeal. Brown rice. Barley. Bulgur wheat. Millet. Quinoa. Bran muffins. Popcorn. Rye wafer crackers. °Meats and other proteins °Navy beans, kidney beans, and pinto beans. Soybeans. Split peas. Lentils. Nuts and seeds. °Dairy °Fiber-fortified yogurt. °Beverages °Fiber-fortified soy milk. Fiber-fortified orange juice. °Other foods °Fiber bars. °The items listed above may not be a complete list of recommended foods and beverages. Contact a dietitian for more information. °What foods should I avoid? °Fruits °Fruit juice. Cooked, strained fruit. °Vegetables °Fried potatoes. Canned vegetables. Well-cooked vegetables. °Grains °White bread. Pasta made with refined flour. White rice. °Meats and other proteins °Fatty cuts of meat. Fried chicken or fried fish. °Dairy °Milk. Yogurt. Cream cheese. Sour cream. °Fats and   oils °Butters. °Beverages °Soft drinks. °Other foods °Cakes and pastries. °The items listed above may  not be a complete list of foods and beverages to avoid. Talk with your dietitian about what choices are best for you. °Summary °Fiber is a type of carbohydrate. It is found in foods such as fruits, vegetables, whole grains, and beans. °A high-fiber diet has many benefits. It can help to prevent constipation, lower blood cholesterol, aid weight loss, and reduce your risk of heart disease, diabetes, and certain cancers. °Increase your intake of fiber gradually. Increasing fiber too quickly may cause cramping, bloating, and gas. Drink plenty of water while you increase the amount of fiber you consume. °The best sources of fiber include whole fruits and vegetables, whole grains, nuts, seeds, and beans. °This information is not intended to replace advice given to you by your health care provider. Make sure you discuss any questions you have with your health care provider. °Document Revised: 06/01/2019 Document Reviewed: 06/01/2019 °Elsevier Patient Education © 2022 [employer] ° °

## 2021-03-03 NOTE — Progress Notes (Signed)
Cephas Darby, MD [address]  [place]  [place], [place] [address]  Main: [phone]  Fax: [phone]    Gastroenterology Consultation  Referring Provider:     Sindy Guadeloupe, MD Primary Care Physician:  Michela Pitcher, NP Primary Gastroenterologist:  Dr. Cephas Darby Reason for Consultation:     Iron deficiency anemia        HPI:   Carla Wheeler is a 46 y.o. female referred by Dr. Charmian Muff, Alyson Locket, NP  for consultation & management of iron deficiency anemia.  Patient has history of chronic iron deficiency anemia which is thought to be secondary to heavy menstrual cycles, s/p parenteral iron therapy.  Her most recent hemoglobin is normal and her ferritin levels are also improving.  Patient never had any endoscopic evaluation.  Patient has history of hypothyroidism on Synthroid.  Patient has history of constipation, oral iron made her bowels hard.  She reports her stools are hard associated with straining, takes Dulcolax regularly.  She denies any rectal bleeding.  She denies any abdominal pain, nausea or vomiting  NSAIDs: None  Antiplts/Anticoagulants/Anti thrombotics: None  GI Procedures: None  Past Medical History:  Diagnosis Date   Asthma    Depression    Thyroid disease     Past Surgical History:  Procedure Laterality Date   CESAREAN SECTION     TUBAL LIGATION  2005    Current Outpatient Medications:    albuterol (VENTOLIN HFA) 108 (90 Base) MCG/ACT inhaler, TAKE 2 PUFFS BY MOUTH EVERY 6 HOURS AS NEEDED FOR WHEEZE OR SHORTNESS OF BREATH, Disp: 8.5 each, Rfl: 5   docusate sodium (COLACE) 100 MG capsule, Take 1 capsule (100 mg total) by mouth daily., Disp: 10 capsule, Rfl: 0   levothyroxine (SYNTHROID) 125 MCG tablet, Take 1 tablet (125 mcg total) by mouth daily before breakfast., Disp: 90 tablet, Rfl: 1   Na Sulfate-K Sulfate-Mg Sulf 17.5-3.13-1.6 GM/177ML SOLN, Take 354 mLs by mouth once for 1 dose., Disp: 708 mL, Rfl: 0    Family History   Problem Relation Age of Onset   Cancer Mother 49       Breast   Arthritis Father    Heart disease Father    Stroke Father    Hypertension Father    Diabetes Father    Congestive Heart Failure Father    Cancer Maternal Grandmother        Breast   Hypertension Maternal Grandmother    Hypertension Paternal Grandmother    Learning disabilities Paternal Grandmother      Social History   Tobacco Use   Smoking status: Former    Types: Cigars   Smokeless tobacco: Never  Substance Use Topics   Alcohol use: Yes    Alcohol/week: 3.0 standard drinks    Types: 3 Glasses of wine per week    Comment: occasional   Drug use: No    Allergies as of 03/03/2021 - Review Complete 03/03/2021  Allergen Reaction Noted   Augmentin [amoxicillin-pot clavulanate] Other (See Comments) 07/21/2013    Review of Systems:    All systems reviewed and negative except where noted in HPI.   Physical Exam:  BP (!) 144/74 (BP Location: Left Arm, Patient Position: Sitting, Cuff Size: Normal)    Pulse 79    Temp 99.1 F (37.3 C) (Oral)    Ht 5\' 2"  (1.575 m)    Wt 209 lb 6 oz (95 kg)    BMI 38.30 kg/m  No  LMP recorded.  General:   Alert,  Well-developed, well-nourished, pleasant and cooperative in NAD Head:  Normocephalic and atraumatic. Eyes:  Sclera clear, no icterus.   Conjunctiva pink. Ears:  Normal auditory acuity. Nose:  No deformity, discharge, or lesions. Mouth:  No deformity or lesions,oropharynx pink & moist. Neck:  Supple; no masses or thyromegaly. Lungs:  Respirations even and unlabored.  Clear throughout to auscultation.   No wheezes, crackles, or rhonchi. No acute distress. Heart:  Regular rate and rhythm; no murmurs, clicks, rubs, or gallops. Abdomen:  Normal bowel sounds. Soft, non-tender and non-distended without masses, hepatosplenomegaly or hernias noted.  No guarding or rebound tenderness.   Rectal: Not performed Msk:  Symmetrical without gross deformities. Good, equal movement &  strength bilaterally. Pulses:  Normal pulses noted. Extremities:  No clubbing or edema.  No cyanosis. Neurologic:  Alert and oriented x3;  grossly normal neurologically. Skin:  Intact without significant lesions or rashes. No jaundice. Lymph Nodes:  No significant cervical adenopathy. Psych:  Alert and cooperative. Normal mood and affect.  Imaging Studies: No abdominal imaging  Assessment and Plan:   Carla Wheeler is a 46 y.o. female with history of hypothyroidism on Synthroid, anxiety, depression is seen in consultation for chronic iron deficiency anemia.  Her hemoglobin was 9.5 in September 2022 with ferritin levels 3.6.  She has history of chronic iron deficiency anemia for last few years with hemoglobin around 10.  She does have history of heavy menstrual cycles, taking birth control and Depo shots which did not help her.  Oral iron leads to stomach upset.  Patient's iron deficiency anemia has currently responded to parenteral iron therapy  Chronic idiopathic constipation Discussed about high-fiber diet, information provided Discussed about adequate intake of water Start MiraLAX 34 g daily  Colon cancer screening Recommend screening colonoscopy with 2-day prep  Iron deficiency anemia Secondary to menorrhagia   Follow up as needed   Cephas Darby, MD

## 2021-03-20 ENCOUNTER — Encounter: Payer: Self-pay | Admitting: Gastroenterology

## 2021-03-20 ENCOUNTER — Ambulatory Visit: Payer: BC Managed Care – PPO | Admitting: Anesthesiology

## 2021-03-20 ENCOUNTER — Encounter
Admission: RE | Disposition: A | Discharge status: Home / Self Care | Payer: Self-pay | Source: Home / Self Care | Attending: Gastroenterology

## 2021-03-20 ENCOUNTER — Ambulatory Visit
Admission: RE | Admit: 2021-03-20 | Discharge: 2021-03-20 | Disposition: A | Discharge status: Home / Self Care | Payer: BC Managed Care – PPO | Attending: Gastroenterology | Admitting: Gastroenterology

## 2021-03-20 DIAGNOSIS — K5730 Diverticulosis of large intestine without perforation or abscess without bleeding: Secondary | ICD-10-CM | POA: Insufficient documentation

## 2021-03-20 DIAGNOSIS — Z0000 Encounter for general adult medical examination without abnormal findings: Secondary | ICD-10-CM

## 2021-03-20 DIAGNOSIS — J45909 Unspecified asthma, uncomplicated: Secondary | ICD-10-CM | POA: Diagnosis not present

## 2021-03-20 DIAGNOSIS — Z87891 Personal history of nicotine dependence: Secondary | ICD-10-CM | POA: Insufficient documentation

## 2021-03-20 DIAGNOSIS — E039 Hypothyroidism, unspecified: Secondary | ICD-10-CM | POA: Diagnosis not present

## 2021-03-20 DIAGNOSIS — Z1211 Encounter for screening for malignant neoplasm of colon: Secondary | ICD-10-CM | POA: Diagnosis not present

## 2021-03-20 HISTORY — PX: COLONOSCOPY WITH PROPOFOL: SHX5780

## 2021-03-20 LAB — POCT PREGNANCY, URINE: Preg Test, Ur: NEGATIVE

## 2021-03-20 SURGERY — COLONOSCOPY WITH PROPOFOL
Anesthesia: General

## 2021-03-20 MED ORDER — DEXMEDETOMIDINE HCL IN NACL 80 MCG/20ML IV SOLN
INTRAVENOUS | Status: AC
  Filled 2021-03-20: qty 20

## 2021-03-20 MED ORDER — DEXMEDETOMIDINE (PRECEDEX) IN NS 20 MCG/5ML (4 MCG/ML) IV SYRINGE
PREFILLED_SYRINGE | INTRAVENOUS | Status: DC | PRN
  Administered 2021-03-20: 4 ug via INTRAVENOUS
  Administered 2021-03-20: 8 ug via INTRAVENOUS

## 2021-03-20 MED ORDER — SODIUM CHLORIDE 0.9 % IV SOLN: INTRAVENOUS | Status: DC | PRN

## 2021-03-20 MED ORDER — PROPOFOL 10 MG/ML IV BOLUS
INTRAVENOUS | Status: DC | PRN
Start: 2021-03-20 — End: 2021-03-20
  Administered 2021-03-20: 70 mg via INTRAVENOUS

## 2021-03-20 MED ORDER — GLYCOPYRROLATE 0.2 MG/ML IJ SOLN
INTRAMUSCULAR | Status: DC | PRN
  Administered 2021-03-20: .2 mg via INTRAVENOUS

## 2021-03-20 MED ORDER — PROPOFOL 500 MG/50ML IV EMUL
INTRAVENOUS | Status: DC | PRN
  Administered 2021-03-20: 150 ug/kg/min via INTRAVENOUS

## 2021-03-20 MED ORDER — PROPOFOL 500 MG/50ML IV EMUL
INTRAVENOUS | Status: AC
  Filled 2021-03-20: qty 50

## 2021-03-20 MED ORDER — LIDOCAINE HCL (CARDIAC) PF 100 MG/5ML IV SOSY
PREFILLED_SYRINGE | INTRAVENOUS | Status: DC | PRN
  Administered 2021-03-20: 50 mg via INTRAVENOUS

## 2021-03-20 MED ORDER — SODIUM CHLORIDE 0.9 % IV SOLN
INTRAVENOUS | Status: DC
  Administered 2021-03-20: 20 mL/h via INTRAVENOUS

## 2021-03-20 NOTE — Anesthesia Postprocedure Evaluation (Signed)
Anesthesia Post Note  Patient: Carla Wheeler  Procedure(s) Performed: COLONOSCOPY WITH PROPOFOL  Patient location during evaluation: Endoscopy Anesthesia Type: General Level of consciousness: awake and alert Pain management: pain level controlled Vital Signs Assessment: post-procedure vital signs reviewed and stable Respiratory status: spontaneous breathing, nonlabored ventilation, respiratory function stable and patient connected to nasal cannula oxygen Cardiovascular status: blood pressure returned to baseline and stable Postop Assessment: no apparent nausea or vomiting Anesthetic complications: no   No notable events documented.   Last Vitals:  Vitals:   03/20/21 1005 03/20/21 1015  BP: 133/77 (!) 153/93  Pulse: 67 (!) 57  Resp: (!) 23 16  Temp:    SpO2: 97% 97%    Last Pain:  Vitals:   03/20/21 1015  TempSrc:   PainSc: 0-No pain                 Precious Haws Elizabeht Suto

## 2021-03-20 NOTE — H&P (Signed)
Cephas Darby, MD [address]  [place]  [place], [place] [address]  Main: [phone]  Fax: [phone] Pager: [phone]  Primary Care Physician:  Michela Pitcher, NP Primary Gastroenterologist:  Dr. Cephas Darby  Pre-Procedure History & Physical: HPI:  Carla Wheeler is a 46 y.o. female is here for an colonoscopy.   Past Medical History:  Diagnosis Date   Acute cough 12/16/2020   Asthma    Body aches 12/16/2020   Depression    Mild intermittent asthma with acute exacerbation 07/19/2015   Thyroid disease     Past Surgical History:  Procedure Laterality Date   CESAREAN SECTION     TUBAL LIGATION  2005    Prior to Admission medications   Medication Sig Start Date End Date Taking? Authorizing Provider  albuterol (VENTOLIN HFA) 108 (90 Base) MCG/ACT inhaler TAKE 2 PUFFS BY MOUTH EVERY 6 HOURS AS NEEDED FOR WHEEZE OR SHORTNESS OF BREATH 02/17/20  Yes Bedsole, Amy E, MD  docusate sodium (COLACE) 100 MG capsule Take 1 capsule (100 mg total) by mouth daily. 01/06/21  Yes Michela Pitcher, NP  levothyroxine (SYNTHROID) 125 MCG tablet Take 1 tablet (125 mcg total) by mouth daily before breakfast. 01/06/21  Yes Michela Pitcher, NP    Allergies as of 03/03/2021 - Review Complete 03/03/2021  Allergen Reaction Noted   Augmentin [amoxicillin-pot clavulanate] Other (See Comments) 07/21/2013    Family History  Problem Relation Age of Onset   Cancer Mother 45       Breast   Arthritis Father    Heart disease Father    Stroke Father    Hypertension Father    Diabetes Father    Congestive Heart Failure Father    Cancer Maternal Grandmother        Breast   Hypertension Maternal Grandmother    Hypertension Paternal Grandmother    Learning disabilities Paternal Grandmother     Social History   Socioeconomic History   Marital status: Married    Spouse name: Not on file   Number of children: Not on file   Years of education: Not on file   Highest education level: Not  on file  Occupational History   Not on file  Tobacco Use   Smoking status: Former    Types: Cigars   Smokeless tobacco: Never  Substance and Sexual Activity   Alcohol use: Yes    Alcohol/week: 3.0 standard drinks    Types: 3 Glasses of wine per week    Comment: occasional   Drug use: No   Sexual activity: Yes  Other Topics Concern   Not on file  Social History Narrative   Not on file   Social Determinants of Health   Financial Resource Strain: Not on file  Food Insecurity: Not on file  Transportation Needs: Not on file  Physical Activity: Not on file  Stress: Not on file  Social Connections: Not on file  Intimate Partner Violence: Not on file    Review of Systems: See HPI, otherwise negative ROS  Physical Exam: BP 116/75    Pulse 85    Temp (!) 97.1 F (36.2 C) (Temporal)    Resp 20    Ht 5' (1.524 m)    Wt 88.5 kg    SpO2 98%    BMI 38.08 kg/m  General:   Alert,  pleasant and cooperative in NAD Head:  Normocephalic and atraumatic. Neck:  Supple; no masses or thyromegaly. Lungs:  Clear throughout  to auscultation.    Heart:  Regular rate and rhythm. Abdomen:  Soft, nontender and nondistended. Normal bowel sounds, without guarding, and without rebound.   Neurologic:  Alert and  oriented x4;  grossly normal neurologically.  Impression/Plan: Carla Wheeler is here for an colonoscopy to be performed for colon cancer screening  Risks, benefits, limitations, and alternatives regarding  colonoscopy have been reviewed with the patient.  Questions have been answered.  All parties agreeable.   Sherri Sear, MD  03/20/2021, 9:13 AM

## 2021-03-20 NOTE — Transfer of Care (Signed)
Immediate Anesthesia Transfer of Care Note  Patient: Carla Wheeler  Procedure(s) Performed: COLONOSCOPY WITH PROPOFOL  Patient Location: PACU and Endoscopy Unit  Anesthesia Type:General  Level of Consciousness: drowsy and patient cooperative  Airway & Oxygen Therapy: Patient Spontanous Breathing  Post-op Assessment: Report given to RN and Post -op Vital signs reviewed and stable  Post vital signs: Reviewed and stable  Last Vitals:  Vitals Value Taken Time  BP 108/69 03/20/21 0945  Temp    Pulse 78 03/20/21 0946  Resp 15 03/20/21 0946  SpO2 97 % 03/20/21 0946  Vitals shown include unvalidated device data.  Last Pain:  Vitals:   03/20/21 0839  TempSrc: Temporal  PainSc: 0-No pain         Complications: No notable events documented.

## 2021-03-20 NOTE — Op Note (Signed)
[facility] Patient Name: Carla Wheeler Procedure Date: 03/20/2021 9:21 AM MRN: [id number] Account #: [id number] Date of Birth: [birth year] Admit Type: Outpatient Age: 46 Room: [facility] ENDO ROOM 4 Gender: Female Note Status: Finalized Instrument Name: Jasper Riling [id number] Procedure:             Colonoscopy Indications:           Screening for colorectal malignant neoplasm, This is                         the patient's first colonoscopy Providers:             Lin Landsman MD, MD Referring MD:          Alyson Locket. Charmian Muff (Referring MD) Medicines:             General Anesthesia Complications:         No immediate complications. Estimated blood loss: None. Procedure:             Pre-Anesthesia Assessment:                        - Prior to the procedure, a History and Physical was                         performed, and patient medications and allergies were                         reviewed. The patient is competent. The risks and                         benefits of the procedure and the sedation options and                         risks were discussed with the patient. All questions                         were answered and informed consent was obtained.                         Patient identification and proposed procedure were                         verified by the physician, the nurse, the                         anesthesiologist, the anesthetist and the technician                         in the pre-procedure area in the procedure room in the                         endoscopy suite. Mental Status Examination: alert and                         oriented. Airway Examination: normal oropharyngeal                         airway and neck mobility. Respiratory Examination:  clear to auscultation. CV Examination: normal.                         Prophylactic Antibiotics: The patient does not require                         prophylactic  antibiotics. Prior Anticoagulants: The                         patient has taken no previous anticoagulant or                         antiplatelet agents. ASA Grade Assessment: III - A                         patient with severe systemic disease. After reviewing                         the risks and benefits, the patient was deemed in                         satisfactory condition to undergo the procedure. The                         anesthesia plan was to use general anesthesia.                         Immediately prior to administration of medications,                         the patient was re-assessed for adequacy to receive                         sedatives. The heart rate, respiratory rate, oxygen                         saturations, blood pressure, adequacy of pulmonary                         ventilation, and response to care were monitored                         throughout the procedure. The physical status of the                         patient was re-assessed after the procedure.                        After obtaining informed consent, the colonoscope was                         passed under direct vision. Throughout the procedure,                         the patient's blood pressure, pulse, and oxygen                         saturations were monitored continuously. The  Colonoscope was introduced through the anus and                         advanced to the the cecum, identified by appendiceal                         orifice and ileocecal valve. The colonoscopy was                         performed without difficulty. The patient tolerated                         the procedure well. The quality of the bowel                         preparation was evaluated using the BBPS [facility] Bowel                         Preparation Scale) with scores of: Right Colon = 3,                         Transverse Colon = 3 and Left Colon = 3 (entire mucosa                          seen well with no residual staining, small fragments                         of stool or opaque liquid). The total BBPS score                         equals 9. Findings:      The perianal and digital rectal examinations were normal. Pertinent       negatives include normal sphincter tone and no palpable rectal lesions.      Multiple small and large-mouthed diverticula were found in the       recto-sigmoid colon and sigmoid colon.      The exam was otherwise without abnormality.      The retroflexed view of the distal rectum and anal verge was normal and       showed no anal or rectal abnormalities. Impression:            - Diverticulosis in the recto-sigmoid colon and in the                         sigmoid colon.                        - The examination was otherwise normal.                        - The distal rectum and anal verge are normal on                         retroflexion view.                        - No specimens collected. Recommendation:        - Discharge patient to home (with escort).                        -  Resume previous diet today.                        - Continue present medications.                        - Repeat colonoscopy in 10 years for screening                         purposes. Procedure Code(s):     --- Professional ---                        F7902, Colorectal cancer screening; colonoscopy on                         individual not meeting criteria for high risk Diagnosis Code(s):     --- Professional ---                        Z12.11, Encounter for screening for malignant neoplasm                         of colon                        K57.30, Diverticulosis of large intestine without                         perforation or abscess without bleeding CPT copyright 2019 [employer]. All rights reserved. The codes documented in this report are preliminary and upon coder review may  be revised to meet current compliance requirements. Dr.  Ulyess Mort Lin Landsman MD, MD 03/20/2021 9:43:14 AM This report has been signed electronically. Number of Addenda: 0 Note Initiated On: 03/20/2021 9:21 AM Scope Withdrawal Time: 0 hours 6 minutes 33 seconds  Total Procedure Duration: 0 hours 9 minutes 14 seconds  Estimated Blood Loss:  Estimated blood loss: none.      [facility]

## 2021-03-20 NOTE — Anesthesia Procedure Notes (Signed)
Procedure Name: MAC Date/Time: 03/20/2021 9:28 AM Performed by: Jerrye Noble, CRNA Pre-anesthesia Checklist: Patient identified, Emergency Drugs available, Suction available and Patient being monitored Patient Re-evaluated:Patient Re-evaluated prior to induction Oxygen Delivery Method: Nasal cannula

## 2021-03-20 NOTE — Anesthesia Preprocedure Evaluation (Signed)
Anesthesia Evaluation  Patient identified by MRN, date of birth, ID band Patient awake    Reviewed: Allergy & Precautions, NPO status , Patient's Chart, lab work & pertinent test results  History of Anesthesia Complications Negative for: history of anesthetic complications  Airway Mallampati: III  TM Distance: >3 FB Neck ROM: full    Dental  (+) Chipped   Pulmonary neg shortness of breath, asthma , former smoker,    Pulmonary exam normal        Cardiovascular Exercise Tolerance: Good (-) anginaNormal cardiovascular exam     Neuro/Psych PSYCHIATRIC DISORDERS negative neurological ROS  negative psych ROS   GI/Hepatic negative GI ROS, Neg liver ROS, neg GERD  ,  Endo/Other  Hypothyroidism   Renal/GU negative Renal ROS  negative genitourinary   Musculoskeletal   Abdominal   Peds  Hematology negative hematology ROS (+)   Anesthesia Other Findings Past Medical History: 12/16/2020: Acute cough No date: Asthma 12/16/2020: Body aches No date: Depression 07/19/2015: Mild intermittent asthma with acute exacerbation No date: Thyroid disease  Past Surgical History: No date: CESAREAN SECTION 2005: TUBAL LIGATION  BMI    Body Mass Index: 38.08 kg/m      Reproductive/Obstetrics negative OB ROS                             Anesthesia Physical Anesthesia Plan  ASA: 3  Anesthesia Plan: General   Post-op Pain Management:    Induction: Intravenous  PONV Risk Score and Plan: Propofol infusion and TIVA  Airway Management Planned: Natural Airway and Nasal Cannula  Additional Equipment:   Intra-op Plan:   Post-operative Plan:   Informed Consent: I have reviewed the patients History and Physical, chart, labs and discussed the procedure including the risks, benefits and alternatives for the proposed anesthesia with the patient or authorized representative who has indicated his/her  understanding and acceptance.     Dental Advisory Given  Plan Discussed with: Anesthesiologist, CRNA and Surgeon  Anesthesia Plan Comments: (Patient consented for risks of anesthesia including but not limited to:  - adverse reactions to medications - risk of airway placement if required - damage to eyes, teeth, lips or other oral mucosa - nerve damage due to positioning  - sore throat or hoarseness - Damage to heart, brain, nerves, lungs, other parts of body or loss of life  Patient voiced understanding.)        Anesthesia Quick Evaluation

## 2021-03-21 ENCOUNTER — Encounter: Payer: Self-pay | Admitting: Gastroenterology

## 2021-04-02 ENCOUNTER — Other Ambulatory Visit: Payer: Self-pay | Admitting: *Deleted

## 2021-04-02 DIAGNOSIS — D509 Iron deficiency anemia, unspecified: Secondary | ICD-10-CM

## 2021-04-10 ENCOUNTER — Encounter: Payer: Self-pay | Admitting: Oncology

## 2021-04-11 ENCOUNTER — Inpatient Hospital Stay: Payer: BC Managed Care – PPO | Attending: Oncology

## 2021-04-11 ENCOUNTER — Other Ambulatory Visit: Payer: Self-pay

## 2021-04-11 DIAGNOSIS — N920 Excessive and frequent menstruation with regular cycle: Secondary | ICD-10-CM | POA: Diagnosis present

## 2021-04-11 DIAGNOSIS — D500 Iron deficiency anemia secondary to blood loss (chronic): Secondary | ICD-10-CM | POA: Insufficient documentation

## 2021-04-11 DIAGNOSIS — D509 Iron deficiency anemia, unspecified: Secondary | ICD-10-CM

## 2021-04-11 LAB — IRON AND TIBC
Iron: 37 ug/dL (ref 28–170)
Saturation Ratios: 9 % — ABNORMAL LOW (ref 10.4–31.8)
TIBC: 393 ug/dL (ref 250–450)
UIBC: 356 ug/dL

## 2021-04-11 LAB — CBC WITH DIFFERENTIAL/PLATELET
Abs Immature Granulocytes: 0.02 10*3/uL (ref 0.00–0.07)
Basophils Absolute: 0.1 10*3/uL (ref 0.0–0.1)
Basophils Relative: 1 %
Eosinophils Absolute: 0.3 10*3/uL (ref 0.0–0.5)
Eosinophils Relative: 4 %
HCT: 39 % (ref 36.0–46.0)
Hemoglobin: 12.4 g/dL (ref 12.0–15.0)
Immature Granulocytes: 0 %
Lymphocytes Relative: 36 %
Lymphs Abs: 2.3 10*3/uL (ref 0.7–4.0)
MCH: 25.6 pg — ABNORMAL LOW (ref 26.0–34.0)
MCHC: 31.8 g/dL (ref 30.0–36.0)
MCV: 80.4 fL (ref 80.0–100.0)
Monocytes Absolute: 0.5 10*3/uL (ref 0.1–1.0)
Monocytes Relative: 7 %
Neutro Abs: 3.2 10*3/uL (ref 1.7–7.7)
Neutrophils Relative %: 52 %
Platelets: 289 10*3/uL (ref 150–400)
RBC: 4.85 MIL/uL (ref 3.87–5.11)
RDW: 17.2 % — ABNORMAL HIGH (ref 11.5–15.5)
WBC: 6.3 10*3/uL (ref 4.0–10.5)
nRBC: 0 % (ref 0.0–0.2)

## 2021-04-11 LAB — FERRITIN: Ferritin: 14 ng/mL (ref 11–307)

## 2021-04-14 ENCOUNTER — Inpatient Hospital Stay: Payer: BC Managed Care – PPO | Admitting: Oncology

## 2021-04-14 ENCOUNTER — Inpatient Hospital Stay: Payer: BC Managed Care – PPO

## 2021-04-22 ENCOUNTER — Other Ambulatory Visit: Payer: Self-pay

## 2021-04-22 ENCOUNTER — Inpatient Hospital Stay (HOSPITAL_BASED_OUTPATIENT_CLINIC_OR_DEPARTMENT_OTHER): Payer: BC Managed Care – PPO | Admitting: Oncology

## 2021-04-22 ENCOUNTER — Inpatient Hospital Stay: Payer: BC Managed Care – PPO

## 2021-04-22 VITALS — BP 149/86 | HR 62 | Temp 98.2°F | Resp 16

## 2021-04-22 DIAGNOSIS — D509 Iron deficiency anemia, unspecified: Secondary | ICD-10-CM | POA: Diagnosis not present

## 2021-04-22 DIAGNOSIS — D508 Other iron deficiency anemias: Secondary | ICD-10-CM

## 2021-04-22 DIAGNOSIS — D500 Iron deficiency anemia secondary to blood loss (chronic): Secondary | ICD-10-CM | POA: Diagnosis not present

## 2021-04-22 MED ORDER — SODIUM CHLORIDE 0.9 % IV SOLN: 200.0000 mg | Freq: Once | INTRAVENOUS | Status: DC

## 2021-04-22 MED ORDER — SODIUM CHLORIDE 0.9 % IV SOLN
Freq: Once | INTRAVENOUS | Status: AC
  Filled 2021-04-22: qty 250

## 2021-04-22 MED ORDER — IRON SUCROSE 20 MG/ML IV SOLN
200.0000 mg | Freq: Once | INTRAVENOUS | Status: AC
  Administered 2021-04-22: 200 mg via INTRAVENOUS

## 2021-04-22 NOTE — Progress Notes (Signed)
? ?Hematology/Oncology Consult note ?[place] ?[phone] [phone] ? ?Patient Care Team: ?Michela Pitcher, NP as PCP - General  ? ?Name of the patient: Carla Wheeler  ?[id number]  ?[birth year]  ? ? ?Reason for referral-iron deficiency anemia ?  ?Referring [place], NP ? ?Date of visit: 04/22/21 ? ?I connected with Murray Hodgkins on 04/22/21 at  3:00 PM EDT by telephone visit and verified that I am speaking with the correct person using two identifiers.  ? ?I discussed the limitations, risks, security and privacy concerns of performing an evaluation and management service by telemedicine and the availability of in-person appointments. I also discussed with the patient that there may be a patient responsible charge related to this service. The patient expressed understanding and agreed to proceed.  ? ?Other persons participating in the visit and their role in the encounter: NP, Patient   ? ?Patient?s location: Home  ?Provider?s location: Clinic   ? ?History of presenting illness-patient is a 46 year old African-American female with a past medical history significant for hypothyroidism, anxiety and depression referred for iron deficiency anemia.  Most recently her hemoglobin was 9.5 in September 2022 with ferritin levels that were low at 3.6 and elevated TIBC of 487.  Of note patient has had chronic iron deficiency anemia and her hemoglobin has been mostly around 10.  She does report heavy menstrual cycles for which she has seen GYN in the past and also was taking birth control and Depo shots previously but that has not helped her.  She has tried taking oral iron but it makes her nauseous and constipated.  Denies any blood loss in her stool.  Reports dark stools when she takes oral iron treatment.  Family history significant for breast cancer in her mother and paternal grandmother.  She has never had a colonoscopy.  Denies any consistent use of NSAIDs.   Currently reports ongoing fatigue and occasionally feels lightheaded. ? ?Interval history- Here for follow-up for IDA. Received 5 doses of IV Venofer between 01/15/21-02/05/21. Tolerated infusions well. Had colonoscopy in February 2023 which was unremarkable. Continues to have heavy menstrual cycles that last 7-9 days. Last cycle was about 2 weeks ago. Starting to feel more fatigued over the past few weeks. Needs f/u with OBGYN to see next best options given colonoscopy was good.  ? ?ECOG PS- 1 ? ?Pain scale- 0 ? ?Review of systems- Review of Systems  ?Constitutional:  Positive for malaise/fatigue. Negative for chills, fever and weight loss.  ?HENT:  Negative for congestion, ear pain and tinnitus.   ?Eyes: Negative.  Negative for blurred vision and double vision.  ?Respiratory: Negative.  Negative for cough, sputum production and shortness of breath.   ?Cardiovascular: Negative.  Negative for chest pain, palpitations and leg swelling.  ?Gastrointestinal: Negative.  Negative for abdominal pain, constipation, diarrhea, nausea and vomiting.  ?Genitourinary:  Negative for dysuria, frequency and urgency.  ?     Heavy menstrual cycles lasting 7 to 9 days  ?Musculoskeletal:  Negative for back pain and falls.  ?Skin: Negative.  Negative for rash.  ?Neurological: Negative.  Negative for weakness and headaches.  ?Endo/Heme/Allergies: Negative.  Does not bruise/bleed easily.  ?Psychiatric/Behavioral: Negative.  Negative for depression. The patient is not nervous/anxious and does not have insomnia.   ? ?Allergies  ?Allergen Reactions  ? Augmentin [Amoxicillin-Pot Clavulanate] Other (See Comments)  ?  Muscle cramps  ? ? ?Patient Active Problem List  ? Diagnosis Date Noted  ? Screening for colon  cancer   ? Obesity (BMI 35.0-39.9 without comorbidity) 11/04/2020  ? Paresthesia 11/04/2020  ? Anemia, iron deficiency 07/19/2015  ? Hypothyroidism 07/21/2013  ? Anxiety and depression 07/15/2011  ? ? ? ?Past Medical History:  ?Diagnosis  Date  ? Acute cough 12/16/2020  ? Asthma   ? Body aches 12/16/2020  ? Depression   ? Mild intermittent asthma with acute exacerbation 07/19/2015  ? Thyroid disease   ? ? ? ?Past Surgical History:  ?Procedure Laterality Date  ? CESAREAN SECTION    ? COLONOSCOPY WITH PROPOFOL N/A 03/20/2021  ? Procedure: COLONOSCOPY WITH PROPOFOL;  Surgeon: Lin Landsman, MD;  Location[facility];  Service: Gastroenterology;  Laterality: N/A;  ? TUBAL LIGATION  2005  ? ? ?Social History  ? ?Socioeconomic History  ? Marital status: Married  ?  Spouse name: Not on file  ? Number of children: Not on file  ? Years of education: Not on file  ? Highest education level: Not on file  ?Occupational History  ? Not on file  ?Tobacco Use  ? Smoking status: Former  ?  Types: Cigars  ? Smokeless tobacco: Never  ?Substance and Sexual Activity  ? Alcohol use: Yes  ?  Alcohol/week: 3.0 standard drinks  ?  Types: 3 Glasses of wine per week  ?  Comment: occasional  ? Drug use: No  ? Sexual activity: Yes  ?Other Topics Concern  ? Not on file  ?Social History Narrative  ? Not on file  ? ?Social Determinants of Health  ? ?Financial Resource Strain: Not on file  ?Food Insecurity: Not on file  ?Transportation Needs: Not on file  ?Physical Activity: Not on file  ?Stress: Not on file  ?Social Connections: Not on file  ?Intimate Partner Violence: Not on file  ? ?  ?Family History  ?Problem Relation Age of Onset  ? Cancer Mother 60  ?     Breast  ? Arthritis Father   ? Heart disease Father   ? Stroke Father   ? Hypertension Father   ? Diabetes Father   ? Congestive Heart Failure Father   ? Cancer Maternal Grandmother   ?     Breast  ? Hypertension Maternal Grandmother   ? Hypertension Paternal Grandmother   ? Learning disabilities Paternal Grandmother   ? ? ? ?Current Outpatient Medications:  ?  albuterol (VENTOLIN HFA) 108 (90 Base) MCG/ACT inhaler, TAKE 2 PUFFS BY MOUTH EVERY 6 HOURS AS NEEDED FOR WHEEZE OR SHORTNESS OF BREATH, Disp: 8.5 each, Rfl: 5 ?   docusate sodium (COLACE) 100 MG capsule, Take 1 capsule (100 mg total) by mouth daily., Disp: 10 capsule, Rfl: 0 ?  levothyroxine (SYNTHROID) 125 MCG tablet, Take 1 tablet (125 mcg total) by mouth daily before breakfast., Disp: 90 tablet, Rfl: 1 ? ? ?Physical exam:  ?There were no vitals filed for this visit. ? ?Physical Exam ?Neurological:  ?   Mental Status: She is alert and oriented to person, place, and time.  ?  ? ? ? ?CMP Latest Ref Rng & Units 11/04/2020  ?Glucose 70 - 99 mg/dL 81  ?BUN 6 - 23 mg/dL 14  ?Creatinine 0.40 - 1.20 mg/dL 0.87  ?Sodium 135 - 145 mEq/L 137  ?Potassium 3.5 - 5.1 mEq/L 4.5  ?Chloride 96 - 112 mEq/L 104  ?CO2 19 - 32 mEq/L 26  ?Calcium 8.4 - 10.5 mg/dL 8.9  ?Total Protein 6.0 - 8.3 g/dL 7.2  ?Total Bilirubin 0.2 - 1.2 mg/dL 0.2  ?  Alkaline Phos 39 - 117 U/L 47  ?AST 0 - 37 U/L 13  ?ALT 0 - 35 U/L 11  ? ?CBC Latest Ref Rng & Units 04/11/2021  ?WBC 4.0 - 10.5 K/uL 6.3  ?Hemoglobin 12.0 - 15.0 g/dL 12.4  ?Hematocrit 36.0 - 46.0 % 39.0  ?Platelets 150 - 400 K/uL 289  ? ?Assessment and plan- Patient is a 46 y.o. female who is here for follow-up for iron deficiency anemia. ? ?Iron deficiency anemia- ?Secondary to heavy menstrual cycles.  Received 5 doses of IV Venofer in December 2023.  Tolerated infusions well.  Unable to tolerate oral iron secondary to GI upset.  Had colonoscopy in February 2023 which showed diverticulosis in the rectosigmoid colon and sigmoid colon.  Otherwise normal.  Followed by gynecology but has not seen in a while.  Needs to schedule follow-up to see what the next steps are in improving her heavy menstrual cycles.  Reviewed lab work with patient which showed declining iron levels.  Hemoglobin continues to be normal at 12.4 but ferritin has dropped from 42-14 within 5 to 6 weeks.  Iron saturation is low at 9%.  Discussed 5 additional doses of IV Venofer.  She will then return to clinic in 3 months for follow-up with Dr. Janese Banks with lab work a few days before and possible IV  iron. ? ?I provided 15 minutes of non face-to-face telephone visit time during this encounter, and > 50% was spent counseling as documented under my assessment & plan.  ? ?Faythe Casa, NP ?04/22/2021 11:

## 2021-04-24 ENCOUNTER — Inpatient Hospital Stay: Payer: BC Managed Care – PPO

## 2021-04-24 ENCOUNTER — Other Ambulatory Visit: Payer: Self-pay

## 2021-04-24 VITALS — BP 139/86 | HR 75 | Temp 97.8°F | Resp 16

## 2021-04-24 DIAGNOSIS — D500 Iron deficiency anemia secondary to blood loss (chronic): Secondary | ICD-10-CM | POA: Diagnosis not present

## 2021-04-24 MED ORDER — SODIUM CHLORIDE 0.9 % IV SOLN: 200.0000 mg | Freq: Once | INTRAVENOUS | Status: DC

## 2021-04-24 MED ORDER — SODIUM CHLORIDE 0.9 % IV SOLN
Freq: Once | INTRAVENOUS | Status: AC
  Filled 2021-04-24: qty 250

## 2021-04-24 MED ORDER — IRON SUCROSE 20 MG/ML IV SOLN
200.0000 mg | Freq: Once | INTRAVENOUS | Status: AC
  Administered 2021-04-24: 200 mg via INTRAVENOUS
  Filled 2021-04-24: qty 10

## 2021-04-24 NOTE — Patient Instructions (Signed)

## 2021-04-28 ENCOUNTER — Inpatient Hospital Stay: Payer: BC Managed Care – PPO

## 2021-04-28 ENCOUNTER — Other Ambulatory Visit: Payer: Self-pay

## 2021-04-28 VITALS — BP 151/69 | HR 68

## 2021-04-28 DIAGNOSIS — D508 Other iron deficiency anemias: Secondary | ICD-10-CM

## 2021-04-28 DIAGNOSIS — D500 Iron deficiency anemia secondary to blood loss (chronic): Secondary | ICD-10-CM | POA: Diagnosis not present

## 2021-04-28 MED ORDER — IRON SUCROSE 20 MG/ML IV SOLN
200.0000 mg | Freq: Once | INTRAVENOUS | Status: AC
  Administered 2021-04-28: 200 mg via INTRAVENOUS
  Filled 2021-04-28: qty 10

## 2021-04-28 MED ORDER — SODIUM CHLORIDE 0.9 % IV SOLN
Freq: Once | INTRAVENOUS | Status: AC
  Filled 2021-04-28: qty 250

## 2021-04-28 MED ORDER — SODIUM CHLORIDE 0.9 % IV SOLN: 200.0000 mg | Freq: Once | INTRAVENOUS | Status: DC

## 2021-05-03 ENCOUNTER — Encounter: Payer: Self-pay | Admitting: Oncology

## 2021-05-05 ENCOUNTER — Inpatient Hospital Stay: Payer: BC Managed Care – PPO

## 2021-05-06 ENCOUNTER — Telehealth: Payer: Self-pay | Admitting: *Deleted

## 2021-05-06 ENCOUNTER — Other Ambulatory Visit: Payer: Self-pay

## 2021-05-06 ENCOUNTER — Inpatient Hospital Stay: Payer: BC Managed Care – PPO

## 2021-05-06 VITALS — BP 156/93 | HR 65 | Temp 96.8°F

## 2021-05-06 DIAGNOSIS — D500 Iron deficiency anemia secondary to blood loss (chronic): Secondary | ICD-10-CM | POA: Diagnosis not present

## 2021-05-06 DIAGNOSIS — D508 Other iron deficiency anemias: Secondary | ICD-10-CM

## 2021-05-06 MED ORDER — IRON SUCROSE 20 MG/ML IV SOLN
200.0000 mg | Freq: Once | INTRAVENOUS | Status: AC
  Administered 2021-05-06: 200 mg via INTRAVENOUS
  Filled 2021-05-06: qty 10

## 2021-05-06 MED ORDER — SODIUM CHLORIDE 0.9 % IV SOLN: 200.0000 mg | Freq: Once | INTRAVENOUS | Status: DC

## 2021-05-06 MED ORDER — SODIUM CHLORIDE 0.9 % IV SOLN
Freq: Once | INTRAVENOUS | Status: AC
  Filled 2021-05-06: qty 250

## 2021-05-06 NOTE — Patient Instructions (Signed)

## 2021-05-06 NOTE — Telephone Encounter (Signed)
Pt sent a message yest. That she could not come to the appt for iron 3/27 but could come 3/28-at 3:30. She can make it today- I called and made sure that she can come on today at 3:30. She will be here this afternoon ?

## 2021-05-07 ENCOUNTER — Inpatient Hospital Stay: Payer: BC Managed Care – PPO

## 2021-05-13 ENCOUNTER — Inpatient Hospital Stay: Payer: BC Managed Care – PPO | Attending: Oncology

## 2021-07-09 ENCOUNTER — Encounter: Payer: Self-pay | Admitting: Nurse Practitioner

## 2021-07-09 ENCOUNTER — Ambulatory Visit: Payer: BC Managed Care – PPO | Admitting: Nurse Practitioner

## 2021-07-09 VITALS — BP 128/76 | HR 68 | Temp 97.0°F | Resp 16 | Ht 60.0 in | Wt 208.4 lb

## 2021-07-09 DIAGNOSIS — N921 Excessive and frequent menstruation with irregular cycle: Secondary | ICD-10-CM

## 2021-07-09 DIAGNOSIS — N926 Irregular menstruation, unspecified: Secondary | ICD-10-CM | POA: Diagnosis not present

## 2021-07-09 LAB — POC URINALSYSI DIPSTICK (AUTOMATED)
Bilirubin, UA: NEGATIVE
Blood, UA: POSITIVE
Glucose, UA: NEGATIVE
Ketones, UA: NEGATIVE
Leukocytes, UA: NEGATIVE
Nitrite, UA: NEGATIVE
Protein, UA: POSITIVE — AB
Spec Grav, UA: 1.02 (ref 1.010–1.025)
Urobilinogen, UA: 1 E.U./dL
pH, UA: 6 (ref 5.0–8.0)

## 2021-07-09 LAB — POCT URINE PREGNANCY: Preg Test, Ur: NEGATIVE

## 2021-07-09 MED ORDER — MEDROXYPROGESTERONE ACETATE 10 MG PO TABS: 10.0000 mg | ORAL_TABLET | Freq: Every day | ORAL | 0 refills | Status: DC

## 2021-07-09 MED ORDER — MEDROXYPROGESTERONE ACETATE 5 MG PO TABS: 5.0000 mg | ORAL_TABLET | Freq: Three times a day (TID) | ORAL | 0 refills | Status: DC

## 2021-07-09 NOTE — Assessment & Plan Note (Signed)
Patient has a standard of irregular cycles.  This 1 is regular is normal but lasting longer.  Does have history of uterine fibroids in the past but no treatment was needed per patient report

## 2021-07-09 NOTE — Progress Notes (Signed)
Established Patient Office Visit  Subjective   Patient ID: Carla Wheeler, female    DOB: 1975-06-11  Age: 46 y.o. MRN: [id number]  Chief Complaint  Patient presents with   Menstrual Problem    Has been having irregular menstrual cycles but this last episode has been pesent since Mothers day 2023-heavy - today used 18 tampoons. She is passing clots. Does wear a pad at night also. Does get iron infusions.    HPI  Menstrual issues: States that she has been irregular in the past. States that this period started on 05/14. States that it is heavy. She has been through 18 tampoons that are soaked.   States that she does have a history of ovarian cysts and uterine fibroids in the past. States that they are not that large per patinet report     Review of Systems  Constitutional:  Negative for chills and fever.  Gastrointestinal:  Negative for abdominal pain.       BM dialy and patients baseline   Genitourinary:  Positive for frequency (couple of months). Negative for dysuria and hematuria.       Heavy bleeding  No pain   Neurological:  Negative for dizziness.     Objective:     BP 128/76   Pulse 68   Temp (!) 97 F (36.1 C)   Resp 16   Ht 5' (1.524 m)   Wt 208 lb 6 oz (94.5 kg)   LMP 06/22/2021   SpO2 99%   BMI 40.70 kg/m    Physical Exam Vitals and nursing note reviewed. Exam conducted with a chaperone present [employer] [place], CMA).  Constitutional:      Appearance: Normal appearance. She is obese.  Cardiovascular:     Rate and Rhythm: Normal rate and regular rhythm.     Heart sounds: Normal heart sounds.  Pulmonary:     Breath sounds: Normal breath sounds.  Abdominal:     General: Bowel sounds are normal. There is no distension.     Palpations: There is no mass.     Tenderness: There is no abdominal tenderness. There is no right CVA tenderness or left CVA tenderness.     Hernia: No hernia is present.  Genitourinary:    Exam position: Lithotomy position.      Vagina: Bleeding present.     Cervix: Normal.  Skin:    Coloration: Skin is not pale.  Neurological:     Mental Status: She is alert.     Results for orders placed or performed in visit on 07/09/21  POCT urine pregnancy  Result Value Ref Range   Preg Test, Ur Negative Negative  POCT Urinalysis Dipstick (Automated)  Result Value Ref Range   Color, UA yellow    Clarity, UA cloudy    Glucose, UA Negative Negative   Bilirubin, UA negative    Ketones, UA negative    Spec Grav, UA 1.020 1.010 - 1.025   Blood, UA positive    pH, UA 6.0 5.0 - 8.0   Protein, UA Positive (A) Negative   Urobilinogen, UA 1.0 0.2 or 1.0 E.U./dL   Nitrite, UA negative    Leukocytes, UA Negative Negative      The 10-year ASCVD risk score (Arnett DK, et al., 2019) is: 1.7%    Assessment & Plan:   Problem List Items Addressed This Visit       Other   Menorrhagia with irregular cycle    Patient is having heavy bleeding requiring  18 tampons plus pads throughout the day.  Pelvic exam did show blood pooling in the vaginal vault but no identifying source.  We will check CBC in office also write Provera 10 mg daily for the next week so we can stop bleeding.  Patient has no personal history of breast cancer no CAD no history of DVT or PE and no longer smokes.  Also get stat pelvic and transvaginal ultrasound pending result signs and symptoms reviewed when to seek urgent or emergent health care.  Patient is already receiving IV iron infusions due to low iron counts       Relevant Medications   medroxyPROGESTERone (PROVERA) 10 MG tablet   Other Relevant Orders   CBC   US PELVIC COMPLETE WITH TRANSVAGINAL   Abnormal menstruation - Primary    Patient has a standard of irregular cycles.  This 1 is regular is normal but lasting longer.  Does have history of uterine fibroids in the past but no treatment was needed per patient report       Relevant Medications   medroxyPROGESTERone (PROVERA) 10 MG tablet    Other Relevant Orders   POCT urine pregnancy (Completed)   CBC   US PELVIC COMPLETE WITH TRANSVAGINAL   POCT Urinalysis Dipstick (Automated) (Completed)   Urine Culture    Return if symptoms worsen or fail to improve.    Romilda Garret, NP

## 2021-07-09 NOTE — Patient Instructions (Signed)
Nice to see you today Sent in medication to take 3 times a day for the next week Follow if no improvement

## 2021-07-09 NOTE — Assessment & Plan Note (Signed)
Patient is having heavy bleeding requiring 18 tampons plus pads throughout the day.  Pelvic exam did show blood pooling in the vaginal vault but no identifying source.  We will check CBC in office also write Provera 10 mg daily for the next week so we can stop bleeding.  Patient has no personal history of breast cancer no CAD no history of DVT or PE and no longer smokes.  Also get stat pelvic and transvaginal ultrasound pending result signs and symptoms reviewed when to seek urgent or emergent health care.  Patient is already receiving IV iron infusions due to low iron counts

## 2021-07-10 LAB — CBC
HCT: 35.4 % — ABNORMAL LOW (ref 36.0–46.0)
Hemoglobin: 11.5 g/dL — ABNORMAL LOW (ref 12.0–15.0)
MCHC: 32.4 g/dL (ref 30.0–36.0)
MCV: 83.9 fl (ref 78.0–100.0)
Platelets: 286 10*3/uL (ref 150.0–400.0)
RBC: 4.21 Mil/uL (ref 3.87–5.11)
RDW: 17.4 % — ABNORMAL HIGH (ref 11.5–15.5)
WBC: 4.8 10*3/uL (ref 4.0–10.5)

## 2021-07-10 LAB — URINE CULTURE
MICRO NUMBER:: 13464772
SPECIMEN QUALITY:: ADEQUATE

## 2021-07-14 ENCOUNTER — Ambulatory Visit
Admission: RE | Admit: 2021-07-14 | Discharge: 2021-07-14 | Disposition: A | Discharge status: Home / Self Care | Payer: BC Managed Care – PPO | Source: Ambulatory Visit | Attending: Nurse Practitioner | Admitting: Nurse Practitioner

## 2021-07-14 DIAGNOSIS — N921 Excessive and frequent menstruation with irregular cycle: Secondary | ICD-10-CM

## 2021-07-14 DIAGNOSIS — N926 Irregular menstruation, unspecified: Secondary | ICD-10-CM

## 2021-07-15 ENCOUNTER — Telehealth: Payer: Self-pay | Admitting: Nurse Practitioner

## 2021-07-15 ENCOUNTER — Other Ambulatory Visit: Payer: Self-pay | Admitting: Nurse Practitioner

## 2021-07-15 DIAGNOSIS — N939 Abnormal uterine and vaginal bleeding, unspecified: Secondary | ICD-10-CM

## 2021-07-15 DIAGNOSIS — D259 Leiomyoma of uterus, unspecified: Secondary | ICD-10-CM

## 2021-07-15 NOTE — Telephone Encounter (Signed)
-----   Message from [place] sent at 07/15/2021 11:18 AM EDT ----- Patient advised. Patient would like to go to [facility] location. Medication has helped a lot. Sometimes may see a little blood after urination on the toilet paper and some days nothing is seen. Today nothing so far.

## 2021-07-15 NOTE — Telephone Encounter (Signed)
Referral made 

## 2021-07-15 NOTE — Progress Notes (Signed)
Orders only

## 2021-07-21 ENCOUNTER — Inpatient Hospital Stay: Payer: BC Managed Care – PPO | Attending: Oncology

## 2021-07-23 ENCOUNTER — Inpatient Hospital Stay: Payer: BC Managed Care – PPO | Admitting: Oncology

## 2021-07-23 ENCOUNTER — Inpatient Hospital Stay: Payer: BC Managed Care – PPO

## 2021-07-24 ENCOUNTER — Encounter: Payer: Self-pay | Admitting: Oncology

## 2021-07-31 ENCOUNTER — Ambulatory Visit: Payer: BC Managed Care – PPO | Admitting: Radiology

## 2021-08-14 ENCOUNTER — Emergency Department: Payer: BC Managed Care – PPO

## 2021-08-14 ENCOUNTER — Telehealth: Payer: Self-pay | Admitting: Nurse Practitioner

## 2021-08-14 ENCOUNTER — Emergency Department
Admission: EM | Admit: 2021-08-14 | Discharge: 2021-08-14 | Disposition: A | Discharge status: Home / Self Care | Payer: BC Managed Care – PPO | Attending: Emergency Medicine | Admitting: Emergency Medicine

## 2021-08-14 DIAGNOSIS — M25511 Pain in right shoulder: Secondary | ICD-10-CM | POA: Insufficient documentation

## 2021-08-14 DIAGNOSIS — I10 Essential (primary) hypertension: Secondary | ICD-10-CM | POA: Diagnosis not present

## 2021-08-14 DIAGNOSIS — J45909 Unspecified asthma, uncomplicated: Secondary | ICD-10-CM | POA: Diagnosis not present

## 2021-08-14 LAB — BASIC METABOLIC PANEL
Anion gap: 8 (ref 5–15)
BUN: 15 mg/dL (ref 6–20)
CO2: 24 mmol/L (ref 22–32)
Calcium: 9 mg/dL (ref 8.9–10.3)
Chloride: 108 mmol/L (ref 98–111)
Creatinine, Ser: 0.9 mg/dL (ref 0.44–1.00)
GFR, Estimated: >60 mL/min (ref >60)
Glucose, Bld: 105 mg/dL — ABNORMAL HIGH (ref 70–99)
Potassium: 3.8 mmol/L (ref 3.5–5.1)
Sodium: 140 mmol/L (ref 135–145)

## 2021-08-14 LAB — URINALYSIS, ROUTINE W REFLEX MICROSCOPIC
Bilirubin Urine: NEGATIVE
Glucose, UA: NEGATIVE mg/dL
Hgb urine dipstick: NEGATIVE
Ketones, ur: NEGATIVE mg/dL
Leukocytes,Ua: NEGATIVE
Nitrite: NEGATIVE
Protein, ur: NEGATIVE mg/dL
Specific Gravity, Urine: 1.019 (ref 1.005–1.030)
pH: 6 (ref 5.0–8.0)

## 2021-08-14 LAB — CBC
HCT: 40.2 % (ref 36.0–46.0)
Hemoglobin: 12.4 g/dL (ref 12.0–15.0)
MCH: 25.7 pg — ABNORMAL LOW (ref 26.0–34.0)
MCHC: 30.8 g/dL (ref 30.0–36.0)
MCV: 83.2 fL (ref 80.0–100.0)
Platelets: 294 10*3/uL (ref 150–400)
RBC: 4.83 MIL/uL (ref 3.87–5.11)
RDW: 14.2 % (ref 11.5–15.5)
WBC: 5.9 10*3/uL (ref 4.0–10.5)
nRBC: 0 % (ref 0.0–0.2)

## 2021-08-14 LAB — TROPONIN I (HIGH SENSITIVITY): Troponin I (High Sensitivity): 3 ng/L (ref <18)

## 2021-08-14 LAB — POC URINE PREG, ED: Preg Test, Ur: NEGATIVE

## 2021-08-14 MED ORDER — KETOROLAC TROMETHAMINE 30 MG/ML IJ SOLN
30.0000 mg | Freq: Once | INTRAMUSCULAR | Status: AC
  Administered 2021-08-14: 30 mg via INTRAMUSCULAR
  Filled 2021-08-14: qty 1

## 2021-08-14 MED ORDER — OXYCODONE-ACETAMINOPHEN 5-325 MG PO TABS
1.0000 | ORAL_TABLET | Freq: Once | ORAL | Status: AC
  Administered 2021-08-14: 1 via ORAL
  Filled 2021-08-14: qty 1

## 2021-08-14 MED ORDER — OXYCODONE-ACETAMINOPHEN 5-325 MG PO TABS: 1.0000 | ORAL_TABLET | Freq: Three times a day (TID) | ORAL | 0 refills | Status: DC | PRN

## 2021-08-14 MED ORDER — ACETAMINOPHEN 325 MG PO TABS
650.0000 mg | ORAL_TABLET | Freq: Once | ORAL | Status: AC
  Administered 2021-08-14: 650 mg via ORAL
  Filled 2021-08-14: qty 2

## 2021-08-14 NOTE — ED Notes (Signed)
US at bedside

## 2021-08-14 NOTE — Telephone Encounter (Signed)
Noted that she is in the ED will follow

## 2021-08-14 NOTE — Telephone Encounter (Signed)
Per chart review tab pt is presently at [facility] ED. Sending note to Romilda Garret NP.

## 2021-08-14 NOTE — Telephone Encounter (Signed)
Patient called and stated that her right arm down to her hand is swollen and she is experiencing severe pain and also stated that the pain is 10 out of 10. Patient was triaged.

## 2021-08-14 NOTE — Discharge Instructions (Addendum)
While in the emergency room today her blood pressure was noted to be quite elevated.  Please have this rechecked by your primary care in next 5 to 7 days..  As we discussed please attempt to take the sling off tomorrow and range her shoulder is any prolonged immobilization is a potential to make your tendinitis and stiffness worse.  Ideally do not use the sling after tomorrow.  Please return for any new or worsening of symptoms.

## 2021-08-14 NOTE — ED Provider Triage Note (Signed)
Emergency Medicine Provider Triage Evaluation Note  Carla Wheeler , a 46 y.o. female  was evaluated in triage.  Pt complains of right shoulder pain, some dizziness.  Symptoms started 2 days ago.  Review of Systems  Positive: Shoulder pain Negative: Fever chills, injury  Physical Exam  BP (!) 180/107   Pulse 64   Temp 98.2 F (36.8 C) (Oral)   Resp 18   Ht '5\' 1"'$  (1.549 m)   Wt 83.9 kg   LMP 08/07/2021   SpO2 98%   BMI 34.96 kg/m  Gen:   Awake, no distress   Resp:  Normal effort  MSK:   Right shoulder tender to palpation, increased pain with rotation Other:    Medical Decision Making  Medically screening exam initiated at 3:23 PM.  Appropriate orders placed.  Carla Wheeler was informed that the remainder of the evaluation will be completed by another provider, this initial triage assessment does not replace that evaluation, and the importance of remaining in the ED until their evaluation is complete.     Versie Starks, PA-C 08/14/21 1524

## 2021-08-14 NOTE — ED Provider Notes (Signed)
[facility] Provider Note    Event Date/Time   First MD Initiated Contact with Patient 08/14/21 1918     (approximate)   History   Right Arm Pain  and Dizziness   HPI  Carla Wheeler is a 46 y.o. female with a past medical history of asthma, depression, and thyroid disease who presents for evaluation of approximately 3 days of nontraumatic right shoulder pain.  She states she sometimes will feel dizzy from the pain.  She has significant decreased range of motion.  No pain decreased range of motion or weakness in the right elbow or wrist.  She denies any headache, neck pain, chest pain, cough, shortness of breath, nausea, vomiting, diarrhea or other acute symptoms.  No trauma or injuries.  No history of gout.  No fevers.  No history of illicit drug use.  No clear leaving aggravating factors other than movement which seems to make it much worse.    Past Medical History:  Diagnosis Date   Acute cough 12/16/2020   Asthma    Body aches 12/16/2020   Depression    Mild intermittent asthma with acute exacerbation 07/19/2015   Thyroid disease      Physical Exam  Triage Vital Signs: ED Triage Vitals [08/14/21 1515]  Enc Vitals Group     BP (!) 180/107     Pulse Rate 64     Resp 18     Temp 98.2 F (36.8 C)     Temp Source Oral     SpO2 98 %     Weight 185 lb (83.9 kg)     Height '5\' 1"'$  (1.549 m)     Head Circumference      Peak Flow      Pain Score 2     Pain Loc      Pain Edu?      Excl. in [place]?     Most recent vital signs: Vitals:   08/14/21 1515 08/14/21 1915  BP: (!) 180/107 (!) 184/87  Pulse: 64 (!) 56  Resp: 18 16  Temp: 98.2 F (36.8 C) 98.3 F (36.8 C)  SpO2: 98% 98%    General: Awake, no distress.  CV:  Good peripheral perfusion.  2+ radial pulses. Resp:  Normal effort.  Clear bilaterally. Abd:  No distention.  Soft. Other:  Patient is unable to range her right shoulder.  She is able to fully range at the elbow and wrist and has  symmetric grip strength in right hand compared to the left.  2+ radial pulse.  Sensation is intact in the distribution of the radial ulnar and median nerves.  Patient has some mild tenderness over the anterior and superior aspect of the shoulder but there is nothing to be a large effusion or any induration or significant erythema.  No C-spine tenderness or limitation range of motion of the neck.   ED Results / Procedures / Treatments  Labs (all labs ordered are listed, but only abnormal results are displayed) Labs Reviewed  BASIC METABOLIC PANEL - Abnormal; Notable for the following components:      Result Value   Glucose, Bld 105 (*)    All other components within normal limits  CBC - Abnormal; Notable for the following components:   MCH 25.7 (*)    All other components within normal limits  URINALYSIS, ROUTINE W REFLEX MICROSCOPIC - Abnormal; Notable for the following components:   Color, Urine YELLOW (*)    APPearance CLOUDY (*)  All other components within normal limits  POC URINE PREG, ED  TROPONIN I (HIGH SENSITIVITY)     EKG  EKG is remarkable sinus rhythm with a ventricular rate of 63 with nonspecific ST change in aVL without other clear evidence of acute ischemia or significant arrhythmia.   RADIOLOGY  X-ray of the right shoulder on my interpretation without evidence of acute fracture or dislocation.  I reviewed radiologist interpretation and agree to findings of calcification at the supraspinatus humeral insertion which can be consistent with calcified tendinitis without other acute process.  Right upper extremity ultrasound interpretation without evidence of a DVT.  I also reviewed radiologist interpretation.  PROCEDURES:  Critical Care performed: No  Procedures    MEDICATIONS ORDERED IN ED: Medications  ketorolac (TORADOL) 30 MG/ML injection 30 mg (30 mg Intramuscular Given 08/14/21 1957)  oxyCODONE-acetaminophen (PERCOCET/ROXICET) 5-325 MG per tablet 1 tablet  (1 tablet Oral Given 08/14/21 1956)  acetaminophen (TYLENOL) tablet 650 mg (650 mg Oral Given 08/14/21 1956)     IMPRESSION / MDM / ASSESSMENT AND PLAN / ED COURSE  I reviewed the triage vital signs and the nursing notes. Patient's presentation is most consistent with acute presentation with potential threat to life or bodily function.                               Differential diagnosis includes, but is not limited to tendinitis, gout, DVT versus possible other MSK.  No evidence of an effusion on exam or imaging, fevers or leukocytosis or risk factors to suggest a septic joint at this time.  EKG is remarkable sinus rhythm with a ventricular rate of 63 with nonspecific ST change in aVL without other clear evidence of acute ischemia or significant arrhythmia.  Nonelevated troponin obtained greater than 3 hours after symptom onset is not suggestive of atypical anginal presentation.  X-ray of the right shoulder on my interpretation without evidence of acute fracture or dislocation.  I reviewed radiologist interpretation and agree to findings of calcification at the supraspinatus humeral insertion which can be consistent with calcified tendinitis without other acute process.  Right upper extremity ultrasound interpretation without evidence of a DVT.  I also reviewed radiologist interpretation.  BMP without any significant electrolyte or metabolic derangements.  CBC without leukocytosis or acute anemia.  Pregnancy test is negative and UA ordered in triage is unremarkable.  Patient is feeling much better on my reassessment.  Advised to continue taking NSAIDs but use 40 mg ibuprofen every 6 hours.  Will prescribe a very short course of Percocet to help with severe breakthrough pain.  We will place a sling for her right home tonight discussed that she should take this off soon as possible tomorrow and try to arrange it as prolonged immobilization has potential to worsen her stiffness and what I suspect may  be tendinitis.  Advised to have blood pressure rechecked by PCP.  Discussed returning for any new or worsening of symptoms.  Discharged in stable condition.  Strict return precautions advised and discussed.     FINAL CLINICAL IMPRESSION(S) / ED DIAGNOSES   Final diagnoses:  Acute pain of right shoulder  Hypertension, unspecified type     Rx / DC Orders   ED Discharge Orders          Ordered    oxyCODONE-acetaminophen (PERCOCET) 5-325 MG tablet  Every 8 hours PRN        08/14/21 2200  Note:  This document was prepared using Dragon voice recognition software and may include unintentional dictation errors.   Lucrezia Starch, MD 08/14/21 2202

## 2021-08-14 NOTE — Telephone Encounter (Signed)
[facility] - Client TELEPHONE ADVICE RECORD [facility] Patient Name: Carla Wheeler Gender: Female DOB: December 06, 1975 Age: 46 Y 89 M 9 D Return Phone Number: [phone] (Primary), [phone] (Secondary) Address[place]  [address] Client [facility] - Client Client Site [place] - Day Provider AA - PHYSICIAN, NOT LISTED- MD Contact Type Call Who Is Calling Patient / Member / Family / Caregiver Call Type Triage / Clinical Relationship To Patient Self Return Phone Number [phone] (Primary) Chief Complaint Arm Pain (no known cause) Reason for Call Symptomatic / Request for Health Information Initial Comment Caller states pt's right arm is swollen, having severe pain from her shoulder down to her hand. Translation No Nurse Assessment Nurse: Rolin Barry, RN, Levada Dy Date/Time (Eastern Time): 08/14/2021 1:46:55 PM Confirm and document reason for call. If symptomatic, describe symptoms. ---Caller states pt's right arm is swollen, having severe pain from her shoulder down to her hand. Sx started on Tuesday. No known injury. No falls. Does the patient have any new or worsening symptoms? ---Yes Will a triage be completed? ---Yes Related visit to physician within the last 2 weeks? ---No Does the PT have any chronic conditions? (i.e. diabetes, asthma, this includes High risk factors for pregnancy, etc.) ---Yes List chronic conditions. ---hypothyroid Is the patient pregnant or possibly pregnant? (Ask all females between the ages of 53-55) ---No Is this a behavioral health or substance abuse call? ---No Guidelines Guideline Title Affirmed Question Affirmed Notes Nurse Date/Time [name] Time) Arm Pain Difficulty breathing or unusual sweating (e.g., sweating without exertion) Deaton, RN, Levada Dy 08/14/2021 1:49:18 PM Disp. Time [name] Time) Disposition Final User 08/14/2021  1:50:58 PM Go to ED Now Yes Deaton, RN, Levada Dy PLEASE NOTE: All timestamps contained within this report are represented as [place] Standard Time. CONFIDENTIALTY NOTICE: This fax transmission is intended only for the addressee. It contains information that is legally privileged, confidential or otherwise protected from use or disclosure. If you are not the intended recipient, you are strictly prohibited from reviewing, disclosing, copying using or disseminating any of this information or taking any action in reliance on or regarding this information. If you have received this fax in error, please notify us immediately by telephone so that we can arrange for its return to [place]. Phone: [phone], Toll-Free: [phone], Fax: [phone] Page: 2 of 2 Call Id: [id number] Final Disposition 08/14/2021 1:50:58 PM Go to ED Now Yes Rolin Barry, RN, Cindee Lame Disagree/Comply Comply Caller Understands Yes PreDisposition Did not know what to do Care Advice Given Per Guideline GO TO ED NOW: * You need to be seen in the Emergency Department. * Go to the ED at ___________ [place] now. Drive carefully. NOTE TO TRIAGER - DRIVING: * Another adult should drive. * Patient should not delay going to the emergency department. BRING MEDICINES: CARE ADVICE given per Arm Pain (Adult) guideline. Comments User: Saverio Danker, RN Date/Time [name] Time): 08/14/2021 1:51:59 PM Caller will have husband drive her to the ED. Referrals [facility]

## 2021-08-14 NOTE — ED Triage Notes (Signed)
Pt to ED via POV from home. Pt reports right arm pain/numbness x2days, nausea  and dizzy spells. Pt denies HA, vision changes, CP or SOB.

## 2021-09-10 ENCOUNTER — Encounter: Payer: Self-pay | Admitting: Nurse Practitioner

## 2021-09-10 ENCOUNTER — Ambulatory Visit: Payer: BC Managed Care – PPO | Admitting: Nurse Practitioner

## 2021-09-10 VITALS — BP 110/72 | HR 65 | Temp 99.3°F | Resp 14 | Ht 61.0 in | Wt 213.0 lb

## 2021-09-10 DIAGNOSIS — G4489 Other headache syndrome: Secondary | ICD-10-CM | POA: Insufficient documentation

## 2021-09-10 DIAGNOSIS — R509 Fever, unspecified: Secondary | ICD-10-CM | POA: Diagnosis not present

## 2021-09-10 DIAGNOSIS — J029 Acute pharyngitis, unspecified: Secondary | ICD-10-CM

## 2021-09-10 LAB — POC COVID19 BINAXNOW: SARS Coronavirus 2 Ag: NEGATIVE

## 2021-09-10 LAB — POCT RAPID STREP A (OFFICE): Rapid Strep A Screen: NEGATIVE

## 2021-09-10 NOTE — Patient Instructions (Addendum)
Nice to see you today The strep test was negative in office The covid test was also negative in office Follow up if you symptoms fail to improve or get worse  You can use warm salt water gargles to help with the sore throat. Use throat lozenges or throat numbing spray as need. Tyenol and ibuprofen as needed fo rthe sore throat and body aches This should start moving out in the next 5 days. If you do not improve by days 7-10 or get worse reach out to me. Either call the office or my chart me

## 2021-09-10 NOTE — Assessment & Plan Note (Signed)
Continue using over-the-counter analgesics as needed for symptomatic relief.  Follow-up with no improvement

## 2021-09-10 NOTE — Assessment & Plan Note (Signed)
Continue over-the-counter analgesics as needed, rest, and push fluids.

## 2021-09-10 NOTE — Assessment & Plan Note (Signed)
Patient's strep test and COVID test was negative in office.  Diagnosed patient with viral pharyngitis.  Can using over-the-counter regimens as needed for symptom relief

## 2021-09-10 NOTE — Progress Notes (Signed)
Established Patient Office Visit  Subjective   Patient ID: Carla Wheeler, female    DOB: 08/22/1975  Age: 46 y.o. MRN: [id number]  Chief Complaint  Patient presents with   Sore Throat    Sx started on 09/08/21- sore throat And neck feels swollen, body aches, chills.      Sore Throat: Symptom started Monday (09/08/2021) No sick contacts [place] x2 and one booster Has been doing ibuprofen and Alka Seltzer cold plus, does help but does not last long. No covid test done prior to office visit      Review of Systems  Constitutional:  Positive for chills, fever and malaise/fatigue.       Appetite slightly less  Fluid ok  HENT:  Positive for ear pain and sore throat. Negative for congestion, ear discharge and sinus pain.   Respiratory:  Negative for cough and shortness of breath.   Cardiovascular:  Negative for chest pain.  Musculoskeletal:  Positive for myalgias.  Neurological:  Positive for headaches.      Objective:     BP 110/72   Pulse 65   Temp 99.3 F (37.4 C)   Resp 14   Ht '5\' 1"'$  (1.549 m)   Wt 213 lb (96.6 kg)   LMP 08/25/2021   SpO2 99%   BMI 40.25 kg/m    Physical Exam Constitutional:      Appearance: She is well-developed. She is obese.  HENT:     Right Ear: Tympanic membrane, ear canal and external ear normal.     Left Ear: Tympanic membrane, ear canal and external ear normal.     Nose:     Right Sinus: No maxillary sinus tenderness or frontal sinus tenderness.     Left Sinus: No maxillary sinus tenderness or frontal sinus tenderness.     Mouth/Throat:     Mouth: Mucous membranes are moist.     Pharynx: Posterior oropharyngeal erythema present.  Cardiovascular:     Rate and Rhythm: Normal rate and regular rhythm.     Heart sounds: Normal heart sounds.  Pulmonary:     Effort: Pulmonary effort is normal.     Breath sounds: Normal breath sounds.  Lymphadenopathy:     Cervical: Cervical adenopathy present.  Skin:    General: Skin is warm.   Neurological:     Mental Status: She is alert.      Results for orders placed or performed in visit on 09/10/21  Rapid Strep A  Result Value Ref Range   Rapid Strep A Screen Negative Negative  POC COVID-19  Result Value Ref Range   SARS Coronavirus 2 Ag Negative Negative      The 10-year ASCVD risk score (Arnett DK, et al., 2019) is: 0.9%    Assessment & Plan:   Problem List Items Addressed This Visit       Other   Sore throat - Primary    Patient's strep test and COVID test was negative in office.  Diagnosed patient with viral pharyngitis.  Can using over-the-counter regimens as needed for symptom relief      Relevant Orders   Rapid Strep A (Completed)   POC COVID-19 (Completed)   Other headache syndrome    Continue over-the-counter analgesics as needed, rest, and push fluids.      Relevant Orders   POC COVID-19 (Completed)   Fever and chills    Continue using over-the-counter analgesics as needed for symptomatic relief.  Follow-up with no improvement  Relevant Orders   POC COVID-19 (Completed)    Return if symptoms worsen or fail to improve.    Romilda Garret, NP

## 2021-09-23 ENCOUNTER — Encounter: Payer: Self-pay | Admitting: Oncology

## 2021-09-23 ENCOUNTER — Ambulatory Visit: Payer: BC Managed Care – PPO | Admitting: Radiology

## 2021-09-26 ENCOUNTER — Ambulatory Visit: Payer: BC Managed Care – PPO | Admitting: Radiology

## 2021-09-26 ENCOUNTER — Encounter: Payer: Self-pay | Admitting: Radiology

## 2021-09-26 VITALS — BP 126/70 | Ht 60.0 in | Wt 212.0 lb

## 2021-09-26 DIAGNOSIS — D219 Benign neoplasm of connective and other soft tissue, unspecified: Secondary | ICD-10-CM | POA: Diagnosis not present

## 2021-09-26 DIAGNOSIS — N920 Excessive and frequent menstruation with regular cycle: Secondary | ICD-10-CM

## 2021-09-26 MED ORDER — MYFEMBREE 40-1-0.5 MG PO TABS
1.0000 | ORAL_TABLET | Freq: Every day | ORAL | 11 refills | Status: DC
Start: 2021-09-26 — End: 2022-01-19

## 2021-09-26 NOTE — Progress Notes (Signed)
Carla Wheeler 1975-10-07 [id number]   History:  46 y.o. G6P3 referred from PCP for abnormal uterine bleeding, uterine fibroids on u/s (07/14/21) Complains of very heavy periods (increased over the last 6 months).  Last iron infusion 02/2021.  P 2021 M 11/01/19 C 03/20/21  Obstetric History OB History  Gravida Para Term Preterm AB Living  '6 3       3  '$ SAB IAB Ectopic Multiple Live Births               # Outcome Date GA Lbr Len/2nd Weight Sex Delivery Anes PTL Lv  6 Gravida           5 Gravida           4 Gravida           3 Para           2 Para           1 Para              The following portions of the patient's history were reviewed and updated as appropriate: allergies, current medications, past family history, past medical history, past social history, past surgical history, and problem list.  Review of Systems Pertinent items noted in HPI and remainder of comprehensive ROS otherwise negative.   Past medical history, past surgical history, family history and social history were all reviewed and documented in the EPIC chart.  Narrative & Impression  CLINICAL DATA:  Menorrhagia, heavy menstrual bleeding, LMP 06/20/2021   EXAM: TRANSABDOMINAL AND TRANSVAGINAL ULTRASOUND OF PELVIS   TECHNIQUE: Both transabdominal and transvaginal ultrasound examinations of the pelvis were performed. Transabdominal technique was performed for global imaging of the pelvis including uterus, ovaries, adnexal regions, and pelvic cul-de-sac. It was necessary to proceed with endovaginal exam following the transabdominal exam to visualize the endometrium and ovaries.   COMPARISON:  07/14/2017   FINDINGS: Uterus   Measurements: 12.1 x 7.4 x 8.0 cm = volume: 378 mL. Anteverted. Mildly enlarged and heterogeneous containing multiple nodules consistent with leiomyomata. Largest of these measure 2.8 cm intramural LEFT fundal, 4.2 cm subserosal posterior mid uterus, and 4.4 cm subserosal  posterior mid uterus.   Endometrium   Thickness: 10 mm.  No endometrial fluid or mass   Right ovary   Measurements: 2.4 x 1.1 x 1.6 cm = volume: 2.2 mL. Normal morphology without mass   Left ovary   Not visualized, likely obscured by bowel   Other findings   No free pelvic fluid or adnexal masses.   IMPRESSION: At least 3 uterine leiomyomata as above.   Nonvisualization of LEFT ovary.   Unremarkable endometrial complex and RIGHT ovary.     Electronically Signed   By: Lavonia Dana M.D.   On: 07/14/2021 15:29    Exam:  Vitals:   09/26/21 1113  BP: 126/70  Weight: 212 lb (96.2 kg)  Height: 5' (1.524 m)   Body mass index is 41.4 kg/m.  General appearance:  Normal Genitourinary   Inguinal/mons:  Normal without inguinal adenopathy  External genitalia:  Normal appearing vulva with no masses, tenderness, or lesions  BUS/Urethra/Skene's glands:  Normal without masses or exudate  Vagina:  Normal appearing with normal color and discharge, no lesions  Cervix:  Normal appearing without discharge or lesions  Uterus:  enlarged, irregular shape. Fibroid uterus  Adnexa/parametria:     Rt: Normal in size, without masses or tenderness.   Lt: Normal in size, without masses or tenderness.  Anus and perineum: Normal   Chaperone offered and declined  Assessment/Plan:    1. Fibroids 2. Menorrhagia with regular cycle Discussed treatment options including IUD, surgery, Myfembree, elects to try myfembree  - Relugolix-Estradiol-Norethind (MYFEMBREE) 40-1-0.5 MG TABS; Take 1 tablet by mouth daily.  Dispense: 28 tablet; Refill: 11   Follow up 3 months   Skylynne Schlechter B WHNP-BC 11:51 AM 09/26/2021

## 2021-10-07 ENCOUNTER — Encounter: Payer: Self-pay | Admitting: Nurse Practitioner

## 2021-10-08 ENCOUNTER — Telehealth: Payer: Self-pay | Admitting: *Deleted

## 2021-10-08 NOTE — Telephone Encounter (Signed)
PA done via cover my meds for Myfembree, pending response from insurance.

## 2021-10-08 NOTE — Telephone Encounter (Signed)
Can we follow up with her? I'm not sure why she reached out to her PCP

## 2021-10-08 NOTE — Telephone Encounter (Signed)
[facility] [employer] approved effective 10/08/21-10/09/2022

## 2021-10-08 NOTE — Telephone Encounter (Signed)
Jami,  Mutual patient reached out to me in regards to a PA on what I assume is Myfembree. I was wondering if your staff could reach out to her on the the status of that PA please.  Thanks [facility]

## 2021-11-06 ENCOUNTER — Ambulatory Visit: Payer: BC Managed Care – PPO | Admitting: Nurse Practitioner

## 2021-11-06 VITALS — BP 144/80 | HR 59 | Temp 98.9°F | Resp 14 | Ht 60.0 in | Wt 208.4 lb

## 2021-11-06 DIAGNOSIS — J069 Acute upper respiratory infection, unspecified: Secondary | ICD-10-CM

## 2021-11-06 DIAGNOSIS — R051 Acute cough: Secondary | ICD-10-CM

## 2021-11-06 DIAGNOSIS — R52 Pain, unspecified: Secondary | ICD-10-CM | POA: Diagnosis not present

## 2021-11-06 LAB — POCT INFLUENZA A/B
Influenza A, POC: NEGATIVE
Influenza B, POC: NEGATIVE

## 2021-11-06 LAB — POC COVID19 BINAXNOW: SARS Coronavirus 2 Ag: NEGATIVE

## 2021-11-06 MED ORDER — FLUTICASONE PROPIONATE 50 MCG/ACT NA SUSP: 2.0000 | Freq: Every day | NASAL | 0 refills | Status: DC

## 2021-11-06 MED ORDER — GUAIFENESIN-CODEINE 100-10 MG/5ML PO SOLN: 5.0000 mL | Freq: Three times a day (TID) | ORAL | 0 refills | Status: AC | PRN

## 2021-11-06 NOTE — Assessment & Plan Note (Signed)
COVID and flu test negative in office.  Symptomatic treatment this point.  Follow-up if no improvement.

## 2021-11-06 NOTE — Progress Notes (Signed)
Established Patient Office Visit  Subjective   Patient ID: Carla Wheeler, female    DOB: 09/24/1975  Age: 46 y.o. MRN: [id number]  Chief Complaint  Patient presents with   Nasal Congestion    Sx started on 11/02/21-Head congestion, coughing up yellowish/brownish phlegm, body aches, lots of congestion and blowing nose, chills, body aches.     HPI  Sick sympotms:   Symptoms started on 11/02/2021 Covid vaccines pfizer x3 No sick contacts. 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       Decreased appetitive Plenty of fluids  HENT:  Positive for congestion and sinus pain. Negative for ear discharge, ear pain and sore throat.   Respiratory:  Positive for cough (yellow and brown) and shortness of breath. Negative for wheezing.   Musculoskeletal:  Positive for myalgias.  Neurological:  Positive for headaches.      Objective:     BP (!) 144/80   Pulse (!) 59   Temp 98.9 F (37.2 C) (Oral)   Resp 14   Ht 5' (1.524 m)   Wt 208 lb 6 oz (94.5 kg)   LMP 09/23/2021   SpO2 99%   BMI 40.70 kg/m  BP Readings from Last 3 Encounters:  11/06/21 (!) 144/80  09/26/21 126/70  09/10/21 110/72   Wt Readings from Last 3 Encounters:  11/06/21 208 lb 6 oz (94.5 kg)  09/26/21 212 lb (96.2 kg)  09/10/21 213 lb (96.6 kg)      Physical Exam Vitals and nursing note reviewed.  Constitutional:      Appearance: Normal appearance.  HENT:     Right Ear: Tympanic membrane, ear canal and external ear normal.     Left Ear: Tympanic membrane, ear canal and external ear normal.     Nose:     Right Sinus: No maxillary sinus tenderness or frontal sinus tenderness.     Left Sinus: No maxillary sinus tenderness or frontal sinus tenderness.     Mouth/Throat:     Mouth: Mucous membranes are moist.     Pharynx: Oropharynx is clear. No posterior  oropharyngeal erythema.  Cardiovascular:     Rate and Rhythm: Normal rate and regular rhythm.     Heart sounds: Normal heart sounds.  Pulmonary:     Effort: Pulmonary effort is normal.     Breath sounds: Normal breath sounds.  Lymphadenopathy:     Cervical: No cervical adenopathy.  Neurological:     Mental Status: She is alert.      Results for orders placed or performed in visit on 11/06/21  POC COVID-19  Result Value Ref Range   SARS Coronavirus 2 Ag Negative Negative  POCT Influenza A/B  Result Value Ref Range   Influenza A, POC Negative Negative   Influenza B, POC Negative Negative      The 10-year ASCVD risk score (Arnett DK, et al., 2019) is: 2.8%    Assessment & Plan:   Problem List Items Addressed This Visit       Respiratory   Upper respiratory tract infection    COVID and flu test negative in office.  Symptomatic treatment this point.  Follow-up if no improvement.        Other   Acute cough - Primary    .  Patient on fluticasone nasal spray with epistaxis precautions.  Start patient  on guaifenesin-codeine cough medication 3 times daily as needed.  Sedation precautions reviewed      Relevant Medications   fluticasone (FLONASE) 50 MCG/ACT nasal spray   guaiFENesin-codeine 100-10 MG/5ML syrup   Other Relevant Orders   POC COVID-19 (Completed)   POCT Influenza A/B (Completed)   Body aches    Continue using over-the-counter analgesics as needed.  Rest and push fluids.      Relevant Orders   POC COVID-19 (Completed)   POCT Influenza A/B (Completed)    Return in about 4 weeks (around 12/04/2021) for CPe and Labs.    Romilda Garret, NP

## 2021-11-06 NOTE — Patient Instructions (Addendum)
Nice to see you today  It is time for your physical. Make that appointment for the next 4-6 weeks. This needs to be a fasting appointment  I sent in cough medication that can make you sleepy so use caution You are likely halfway through the illness. If you do not improve or start getting worse let me know

## 2021-11-06 NOTE — Assessment & Plan Note (Signed)
Continue using over-the-counter analgesics as needed.  Rest and push fluids.

## 2021-11-06 NOTE — Assessment & Plan Note (Signed)
.    Patient on fluticasone nasal spray with epistaxis precautions.  Start patient on guaifenesin-codeine cough medication 3 times daily as needed.  Sedation precautions reviewed

## 2021-11-12 LAB — HM MAMMOGRAPHY

## 2021-11-14 ENCOUNTER — Encounter: Payer: Self-pay | Admitting: Nurse Practitioner

## 2021-11-20 IMAGING — US US THYROID
1 series · 13 of 25 positions shown · non-contrast
Comparison: Neck CT-02/19/2020

CLINICAL DATA: Incidental on CT. Thyroid nodule incidentally noted
on neck CT.

EXAM:
THYROID ULTRASOUND
TECHNIQUE: Ultrasound examination of the thyroid gland and adjacent soft
tissues was performed.

[Series 1: us thyroid · 73 acquisitions, 13 frames shown]
[im 1/73]
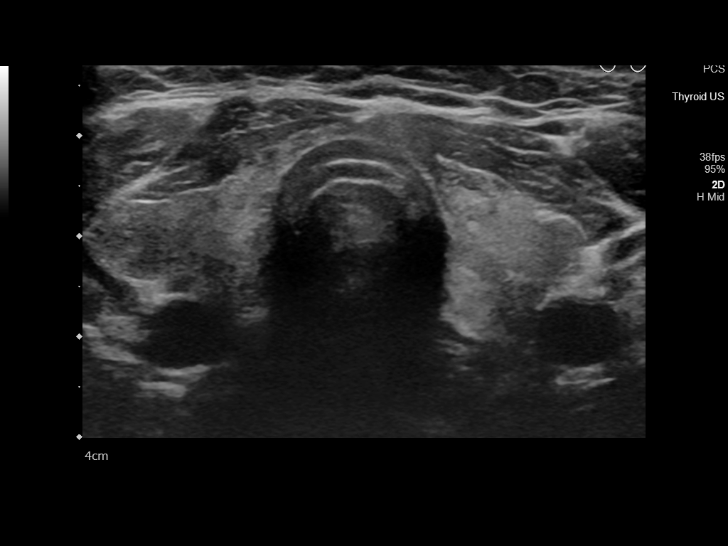
[im 7/73]
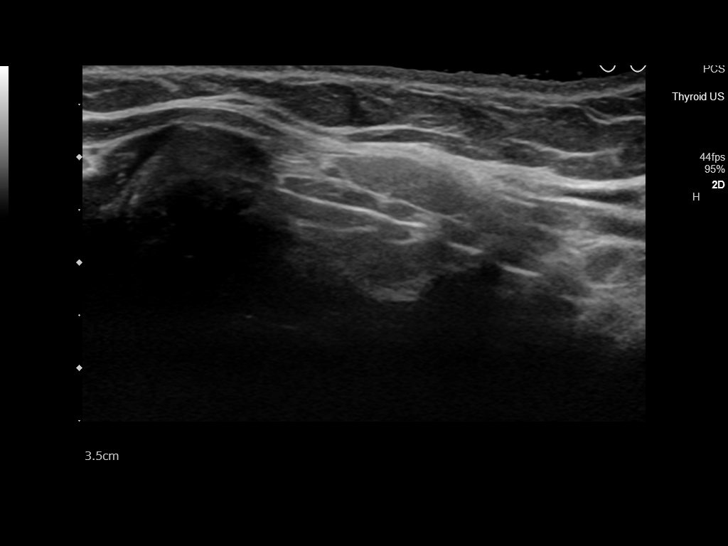
[im 13/73]
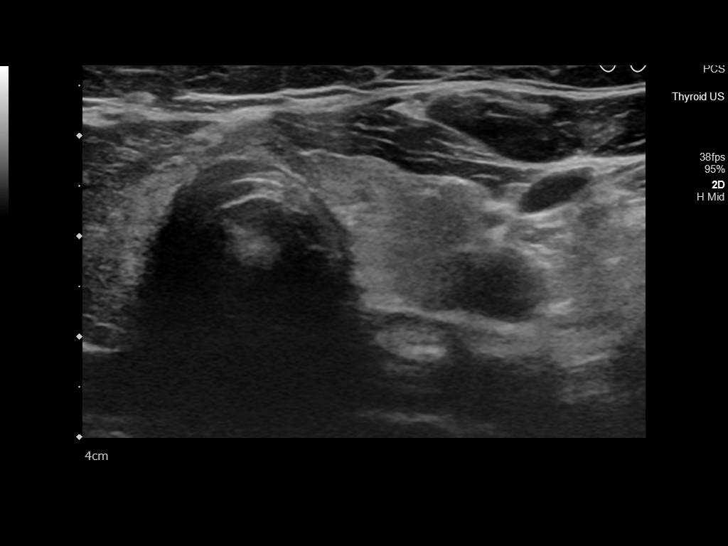
[im 19/73]
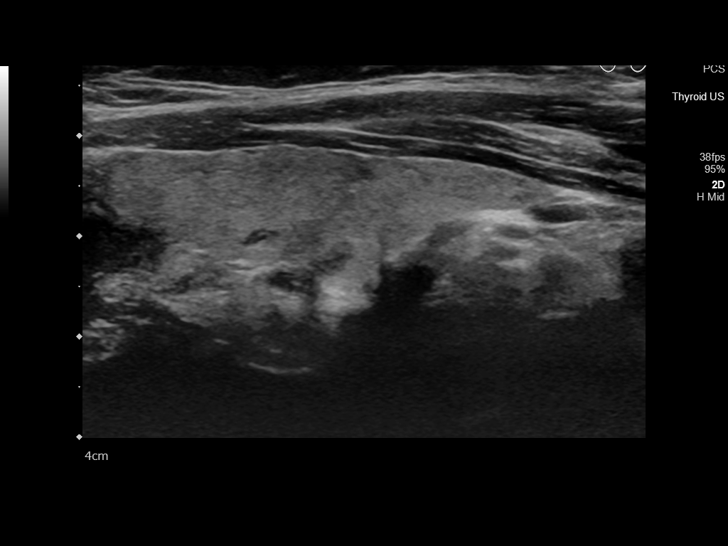
[im 25/73]
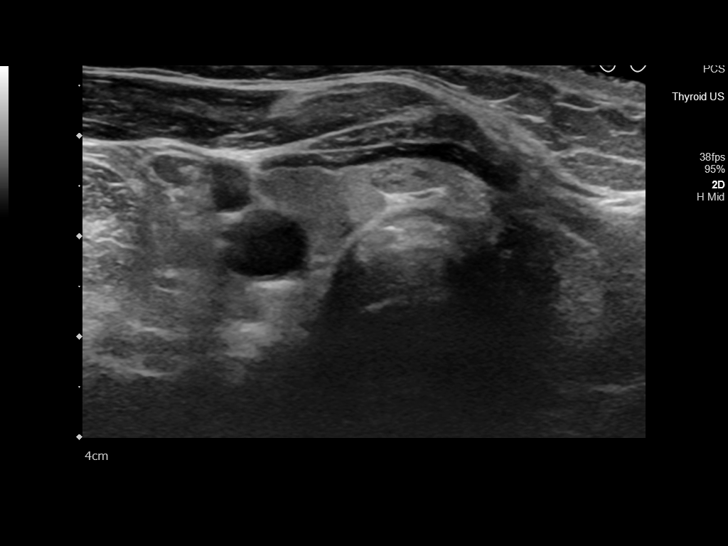
[im 31/73]
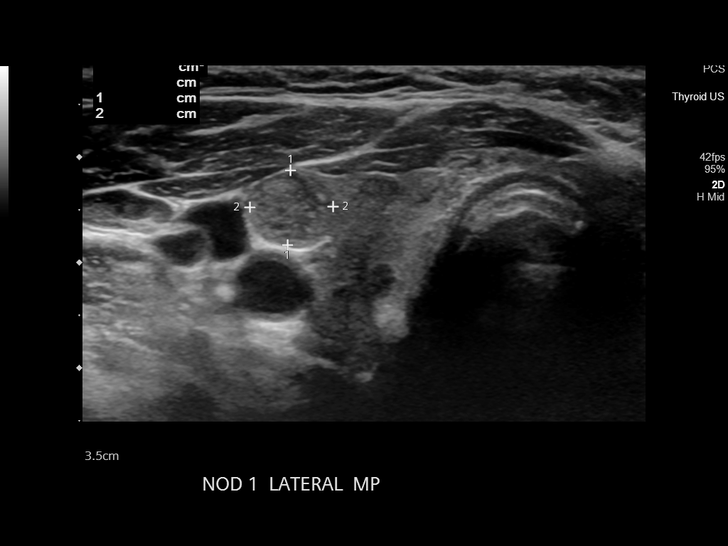
[im 37/73]
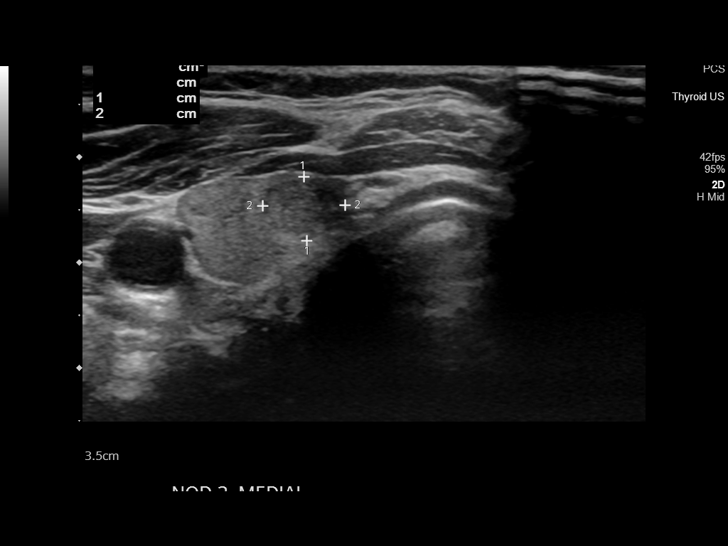
[im 43/73]
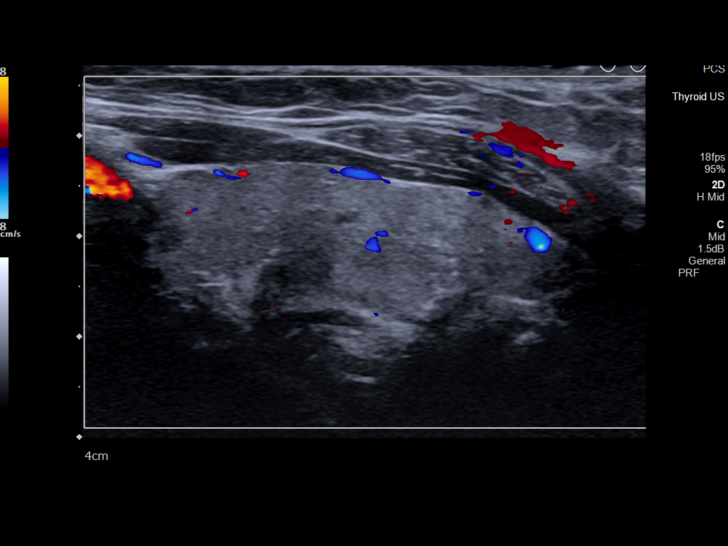
[im 49/73]
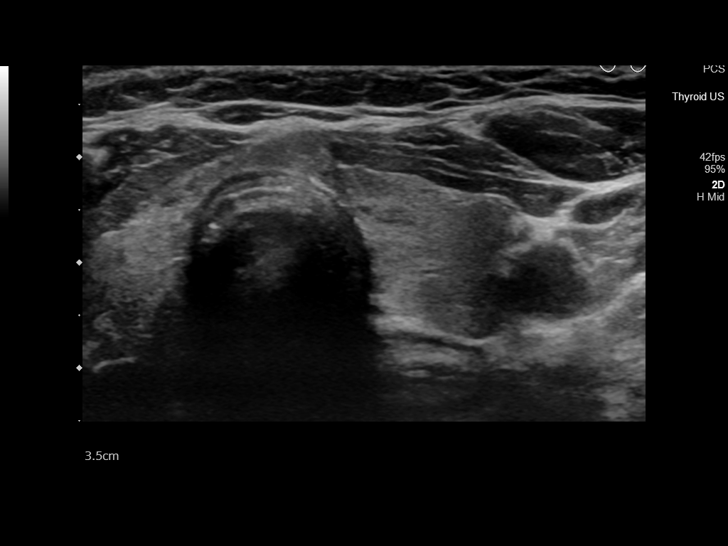
[im 55/73]
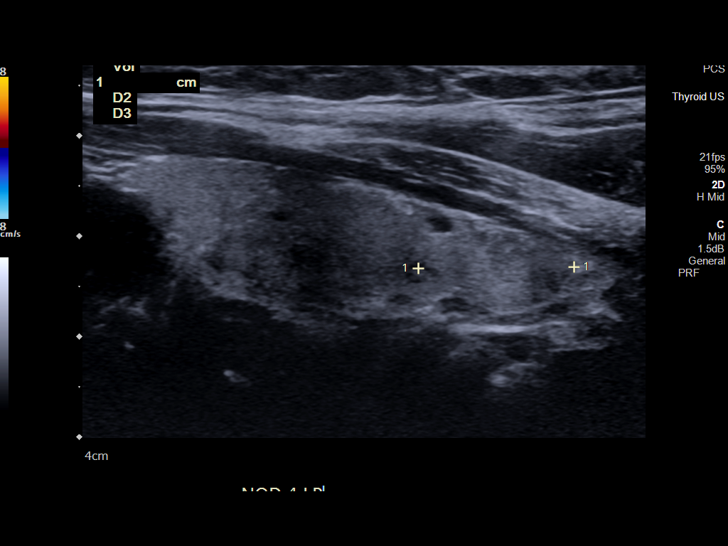
[im 61/73]
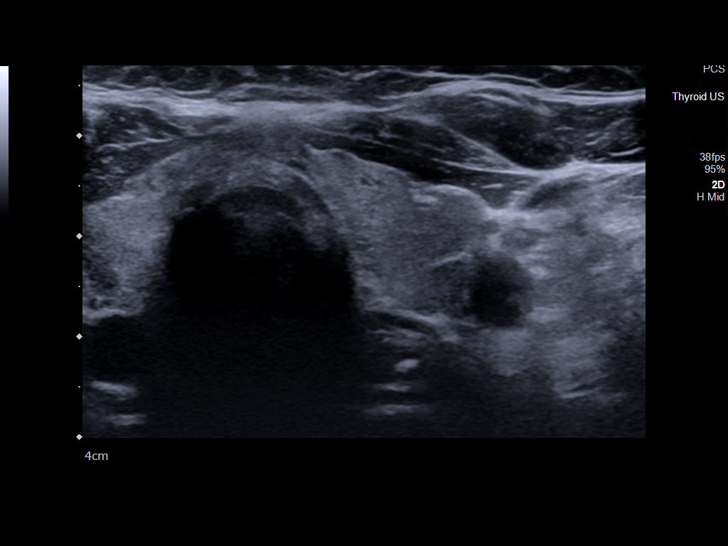
[im 67/73]
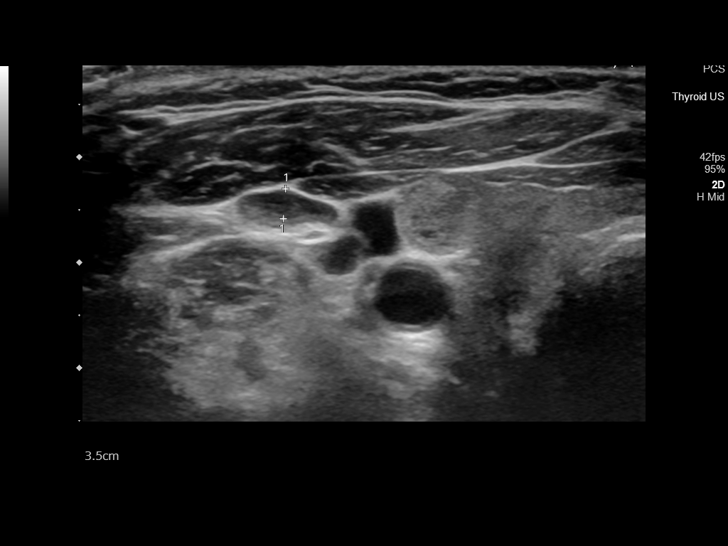
[im 73/73]
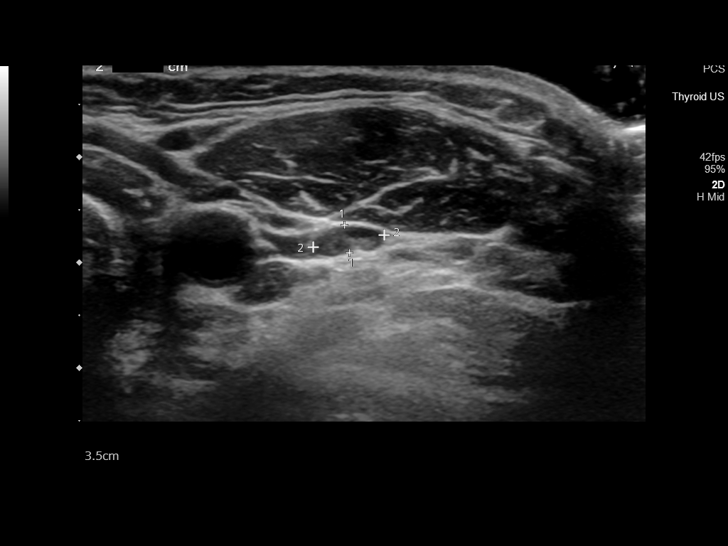

[13 of 25 positions shown; findings below may reference images not displayed]

FINDINGS: Parenchymal Echotexture: Moderately heterogenous

Isthmus: Normal in size measures 0.4 cm in diameter

Right lobe: Normal in size measuring 4.6 x 1.8 x 1.8 cm

Left lobe: Normal in size measuring 4.9 x 1.4 x 1.5 cm

_________________________________________________________

Estimated total number of nodules >/= 1 cm: 3

Number of spongiform nodules >/=  2 cm not described below (TR1): 0

Number of mixed cystic and solid nodules >/= 1.5 cm not described
below (TR2): 0

_________________________________________________________

Questioned 1.7 x 0.8 x 0.7 cm isoechoic ill-defined nodule within
the mid, lateral aspect the right lobe of the thyroid (labeled 1) is
favored to represent a pseudonodule as it lacks defined borders on
both provided transverse and sagittal images.

Questioned 1.0 x 0.8 x 0.6 cm isoechoic ill-defined nodule involving
the mid, medial aspect the right lobe of the thyroid (labeled 2) is
favored to represent a pseudonodule as it lacks defined borders on
both the provided transverse and sagittal images.

Questioned 0.8 x 0.7 x 0.4 cm isoechoic ill-defined nodule within
the inferior, medial aspect the right lobe of the thyroid (labeled
3), does not meet criteria to recommend percutaneous sampling or
continued dedicated follow-up.

_________________________________________________________

Nodule # 4:

Location: Left; Inferior - correlates with the nodule questioned on
preceding chest CT

Maximum size: 1.6 cm; Other 2 dimensions: 1.5 x 1.1 cm

Composition: solid/almost completely solid (2)

Echogenicity: isoechoic (1)

Shape: not taller-than-wide (0)

Margins: ill-defined (0)

Echogenic foci: none (0)

ACR TI-RADS total points: 3.

ACR TI-RADS risk category: TR3 (3 points).

ACR TI-RADS recommendations:

*Given size (>/= 1.5 - 2.4 cm) and appearance, a follow-up
ultrasound in 1 year should be considered based on TI-RADS criteria.

_________________________________________________________
IMPRESSION: 1. Moderately heterogeneous appearing but normal sized thyroid,
nonspecific though could be seen in the setting of a thyroiditis.
2. Nodule labeled #4, correlating with the questioned nodule seen on
preceding neck CT, meets imaging criteria to recommend 1 year
follow-up.

The above is in keeping with the ACR TI-RADS recommendations - [HOSPITAL] 5789;[DATE].

## 2021-11-28 ENCOUNTER — Other Ambulatory Visit: Payer: Self-pay | Admitting: Nurse Practitioner

## 2021-11-28 DIAGNOSIS — R051 Acute cough: Secondary | ICD-10-CM

## 2021-12-17 ENCOUNTER — Encounter: Payer: BC Managed Care – PPO | Admitting: Nurse Practitioner

## 2022-01-09 ENCOUNTER — Ambulatory Visit: Payer: BC Managed Care – PPO | Admitting: Radiology

## 2022-01-19 ENCOUNTER — Ambulatory Visit: Payer: BC Managed Care – PPO | Admitting: Radiology

## 2022-01-19 ENCOUNTER — Encounter: Payer: Self-pay | Admitting: Radiology

## 2022-01-19 DIAGNOSIS — N920 Excessive and frequent menstruation with regular cycle: Secondary | ICD-10-CM

## 2022-01-19 DIAGNOSIS — D219 Benign neoplasm of connective and other soft tissue, unspecified: Secondary | ICD-10-CM | POA: Diagnosis not present

## 2022-01-19 MED ORDER — MYFEMBREE 40-1-0.5 MG PO TABS: 1.0000 | ORAL_TABLET | Freq: Every day | ORAL | 1 refills | Status: DC

## 2022-01-19 NOTE — Progress Notes (Signed)
      Subjective: Carla Wheeler is a 46 y.o. female ere for f/u after starting myfembree 3 months ago. Doing well, bleeding has decreased to 1 day a month, not heavy. Happy with medication, would like to continue.    Review of Systems  Past Medical History:  Diagnosis Date   Acute cough 12/16/2020   Anemia    Asthma    Body aches 12/16/2020   Depression    Mild intermittent asthma with acute exacerbation 07/19/2015   Thyroid disease       Objective:  Today's Vitals   01/19/22 1609  BP: 128/80   There is no height or weight on file to calculate BMI.   Physical Exam Vitals reviewed.  Constitutional:      Appearance: Normal appearance.  Neurological:     General: No focal deficit present.     Mental Status: She is alert and oriented to person, place, and time.  Psychiatric:        Mood and Affect: Mood normal.        Thought Content: Thought content normal.      Chaperone offered and declined.  Assessment:/Plan:   1. Fibroids - Relugolix-Estradiol-Norethind (MYFEMBREE) 40-1-0.5 MG TABS; Take 1 tablet by mouth daily.  Dispense: 84 tablet; Refill: 1  2. Menorrhagia with regular cycle - Relugolix-Estradiol-Norethind (MYFEMBREE) 40-1-0.5 MG TABS; Take 1 tablet by mouth daily.  Dispense: 84 tablet; Refill: 1    Schedule AEX 07/2022

## 2022-02-01 ENCOUNTER — Other Ambulatory Visit: Payer: Self-pay | Admitting: Nurse Practitioner

## 2022-02-04 ENCOUNTER — Encounter: Payer: Self-pay | Admitting: Emergency Medicine

## 2022-02-04 ENCOUNTER — Ambulatory Visit: Payer: BC Managed Care – PPO | Admitting: Emergency Medicine

## 2022-02-04 VITALS — BP 138/84 | HR 73 | Temp 99.0°F | Ht 60.0 in | Wt 215.1 lb

## 2022-02-04 DIAGNOSIS — R52 Pain, unspecified: Secondary | ICD-10-CM

## 2022-02-04 DIAGNOSIS — R6889 Other general symptoms and signs: Secondary | ICD-10-CM | POA: Insufficient documentation

## 2022-02-04 DIAGNOSIS — J988 Other specified respiratory disorders: Secondary | ICD-10-CM | POA: Diagnosis not present

## 2022-02-04 DIAGNOSIS — B9789 Other viral agents as the cause of diseases classified elsewhere: Secondary | ICD-10-CM | POA: Diagnosis not present

## 2022-02-04 LAB — POCT INFLUENZA A/B
Influenza A, POC: NEGATIVE
Influenza B, POC: NEGATIVE

## 2022-02-04 LAB — POC COVID19 BINAXNOW: SARS Coronavirus 2 Ag: NEGATIVE

## 2022-02-04 MED ORDER — HYDROCODONE-ACETAMINOPHEN 5-325 MG PO TABS: 1.0000 | ORAL_TABLET | Freq: Four times a day (QID) | ORAL | 0 refills | Status: DC | PRN

## 2022-02-04 NOTE — Assessment & Plan Note (Signed)
Stay well-hydrated Continue over-the-counter Mucinex DM as needed.

## 2022-02-04 NOTE — Patient Instructions (Signed)

## 2022-02-04 NOTE — Assessment & Plan Note (Signed)
Clinically stable and running its course. No complications. No red flag signs or symptoms Advised to rest and stay well-hydrated ED precautions given Advised to contact the office if no better or worse during the next several days

## 2022-02-04 NOTE — Progress Notes (Signed)
Carla Wheeler 46 y.o.   Chief Complaint  Patient presents with   Acute Visit    Body aches, cough, congestion, x 3 days     HISTORY OF PRESENT ILLNESS: Acute problem visit today. This is a 46 y.o. female complaining of cough and congestion that started about 3 days ago.  Mostly complaining of body achiness.  Has been taking Mucinex and Tylenol.  Able to eat and drink.  Denies nausea vomiting abdominal pain or diarrhea.  Had some fevers but not today.  No other associated symptoms. No other complaints or medical concerns today.  HPI   Prior to Admission medications   Medication Sig Start Date End Date Taking? Authorizing Provider  albuterol (VENTOLIN HFA) 108 (90 Base) MCG/ACT inhaler TAKE 2 PUFFS BY MOUTH EVERY 6 HOURS AS NEEDED FOR WHEEZE OR SHORTNESS OF BREATH 02/17/20  Yes Bedsole, Amy E, MD  Ferrous Sulfate (IRON PO) Take by mouth.   Yes [provider]  fluticasone (FLONASE) 50 MCG/ACT nasal spray Place 2 sprays into both nostrils daily. 11/06/21  Yes Michela Pitcher, NP  levothyroxine (SYNTHROID) 125 MCG tablet TAKE 1 TABLET BY MOUTH DAILY BEFORE BREAKFAST. 02/03/22  Yes Michela Pitcher, NP  Relugolix-Estradiol-Norethind (MYFEMBREE) 40-1-0.5 MG TABS Take 1 tablet by mouth daily. 01/19/22  Yes Chrzanowski, Annitta Needs, NP    Allergies  Allergen Reactions   Augmentin [Amoxicillin-Pot Clavulanate] Other (See Comments)    Muscle cramps    Patient Active Problem List   Diagnosis Date Noted   Upper respiratory tract infection 11/06/2021   Other headache syndrome 09/10/2021   Fever and chills 09/10/2021   Menorrhagia with irregular cycle 07/09/2021   Abnormal menstruation 07/09/2021   Screening for colon cancer    Sore throat 12/16/2020   Acute cough 12/16/2020   Body aches 12/16/2020   Obesity (BMI 35.0-39.9 without comorbidity) 11/04/2020   Paresthesia 11/04/2020   Anemia, iron deficiency 07/19/2015   Hypothyroidism 07/21/2013   Anxiety and depression 07/15/2011     Past Medical History:  Diagnosis Date   Acute cough 12/16/2020   Anemia    Asthma    Body aches 12/16/2020   Depression    Mild intermittent asthma with acute exacerbation 07/19/2015   Thyroid disease     Past Surgical History:  Procedure Laterality Date   CESAREAN SECTION     COLONOSCOPY WITH PROPOFOL N/A 03/20/2021   Procedure: COLONOSCOPY WITH PROPOFOL;  Surgeon: Lin Landsman, MD;  Location: [facility] ENDOSCOPY;  Service: Gastroenterology;  Laterality: N/A;   TUBAL LIGATION  2005    Social History   Socioeconomic History   Marital status: Married    Spouse name: Not on file   Number of children: Not on file   Years of education: Not on file   Highest education level: Not on file  Occupational History   Not on file  Tobacco Use   Smoking status: Former    Types: Cigars   Smokeless tobacco: Never  Substance and Sexual Activity   Alcohol use: Yes    Alcohol/week: 3.0 standard drinks of alcohol    Types: 3 Glasses of wine per week    Comment: occasional   Drug use: No   Sexual activity: Yes    Partners: Male    Birth control/protection: Surgical    Comment: tubal  Other Topics Concern   Not on file  Social History Narrative   Not on file   Social Determinants of Health   Financial Resource Strain: Not  on file  Food Insecurity: Not on file  Transportation Needs: Not on file  Physical Activity: Not on file  Stress: Not on file  Social Connections: Not on file  Intimate Partner Violence: Not on file    Family History  Problem Relation Age of Onset   Cancer Mother 45       Breast   Arthritis Father    Heart disease Father    Stroke Father    Hypertension Father    Diabetes Father    Congestive Heart Failure Father    Cancer Maternal Grandmother        Breast   Hypertension Maternal Grandmother    Hypertension Paternal Grandmother    Learning disabilities Paternal Grandmother      Review of Systems  Constitutional:  Positive for fever and  malaise/fatigue.  HENT:  Positive for congestion.   Respiratory:  Positive for cough and sputum production. Negative for hemoptysis and shortness of breath.   Cardiovascular: Negative.  Negative for chest pain and palpitations.  Gastrointestinal: Negative.  Negative for abdominal pain, diarrhea, nausea and vomiting.  Genitourinary: Negative.  Negative for dysuria and hematuria.  Skin:  Negative for rash.  Neurological: Negative.  Negative for dizziness and headaches.  All other systems reviewed and are negative.  Today's Vitals   02/04/22 1339  BP: 138/84  Pulse: 73  Temp: 99 F (37.2 C)  TempSrc: Oral  SpO2: 97%  Weight: 215 lb 2 oz (97.6 kg)  Height: 5' (1.524 m)   Body mass index is 42.01 kg/m.   Physical Exam Vitals reviewed.  Constitutional:      Appearance: Normal appearance.  HENT:     Head: Normocephalic.     Mouth/Throat:     Mouth: Mucous membranes are moist.     Pharynx: Oropharynx is clear.  Eyes:     Extraocular Movements: Extraocular movements intact.     Pupils: Pupils are equal, round, and reactive to light.  Cardiovascular:     Rate and Rhythm: Normal rate and regular rhythm.     Pulses: Normal pulses.     Heart sounds: Normal heart sounds.  Pulmonary:     Effort: Pulmonary effort is normal.     Breath sounds: Normal breath sounds.  Musculoskeletal:     Cervical back: No tenderness.  Lymphadenopathy:     Cervical: No cervical adenopathy.  Skin:    General: Skin is warm and dry.  Neurological:     General: No focal deficit present.     Mental Status: She is alert and oriented to person, place, and time.  Psychiatric:        Mood and Affect: Mood normal.        Behavior: Behavior normal.      ASSESSMENT & PLAN: A total of 42 minutes was spent with the patient and counseling/coordination of care regarding preparing for this visit, review of available medical records, review of most recent blood work results, review of chronic medical  conditions, review of all medications, diagnosis of viral respiratory infection and treatment, pain management, prognosis, documentation, need for follow-up.  Problem List Items Addressed This Visit       Respiratory   Viral respiratory infection - Primary    Clinically stable and running its course. No complications. No red flag signs or symptoms Advised to rest and stay well-hydrated ED precautions given Advised to contact the office if no better or worse during the next several days  Other   Body aches    Pain management discussed. Take Aleve twice a day and Tylenol as needed for mild to moderate pain.  Take Norco for moderate to severe pain as needed.      Generalized body aches   Relevant Medications   HYDROcodone-acetaminophen (NORCO) 5-325 MG tablet   Flu-like symptoms    Stay well-hydrated Continue over-the-counter Mucinex DM as needed.      Relevant Orders   POC COVID-19 (Completed)   POCT Influenza A/B (Completed)   Patient Instructions  Viral Respiratory Infection A viral respiratory infection is an illness that affects parts of the body that are used for breathing. These include the lungs, nose, and throat. It is caused by a germ called a virus. Some examples of this kind of infection are: A cold. The flu (influenza). A respiratory syncytial virus (RSV) infection. What are the causes? This condition is caused by a virus. It spreads from person to person. You can get the virus if: You breathe in droplets from someone who is sick. You come in contact with people who are sick. You touch mucus or other fluid from a person who is sick. What are the signs or symptoms? Symptoms of this condition include: A stuffy or runny nose. A sore throat. A cough. Shortness of breath. Trouble breathing. Yellow or green fluid in the nose. Other symptoms may include: A fever. Sweating or chills. Tiredness (fatigue). Achy muscles. A headache. How is this  treated? This condition may be treated with: Medicines that treat viruses. Medicines that make it easy to breathe. Medicines that are sprayed into the nose. Acetaminophen or NSAIDs, such as ibuprofen, to treat fever. Follow these instructions at home: Managing pain and congestion Take over-the-counter and prescription medicines only as told by your doctor. If you have a sore throat, gargle with salt water. Do this 3-4 times a day or as needed. To make salt water, dissolve -1 tsp (3-6 g) of salt in 1 cup (237 mL) of warm water. Make sure that all the salt dissolves. Use nose drops made from salt water. This helps with stuffiness (congestion). It also helps soften the skin around your nose. Take 2 tsp (10 mL) of honey at bedtime to lessen coughing at night. Do not give honey to children who are younger than 88 year old. Drink enough fluid to keep your pee (urine) pale yellow. General instructions  Rest as much as possible. Do not drink alcohol. Do not smoke or use any products that contain nicotine or tobacco. If you need help quitting, ask your doctor. Keep all follow-up visits. How is this prevented?     Get a flu shot every year. Ask your doctor when you should get your flu shot. Do not let other people get your germs. If you are sick: Wash your hands with soap and water often. Wash your hands after you cough or sneeze. Wash hands for at least 20 seconds. If you cannot use soap and water, use hand sanitizer. Cover your mouth when you cough. Cover your nose and mouth when you sneeze. Do not share cups or eating utensils. Clean commonly used objects often. Clean commonly touched surfaces. Stay home from work or school. Avoid contact with people who are sick during cold and flu season. This is in fall and winter. Get help if: Your symptoms last for 10 days or longer. Your symptoms get worse over time. You have very bad pain in your face or forehead. Parts of your jaw  or neck get  very swollen. You have shortness of breath. Get help right away if: You feel pain or pressure in your chest. You have trouble breathing. You faint or feel like you will faint. You keep vomiting and it gets worse. You feel confused. These symptoms may be an emergency. Get help right away. Call your local emergency services (911 in the [place] Do not wait to see if the symptoms will go away. Do not drive yourself to the hospital. Summary A viral respiratory infection is an illness that affects parts of the body that are used for breathing. Examples of this illness include a cold, the flu, and a respiratory syncytial virus (RSV) infection. The infection can cause a runny nose, cough, sore throat, and fever. Follow what your doctor tells you about taking medicines, drinking lots of fluid, washing your hands, resting at home, and avoiding people who are sick. This information is not intended to replace advice given to you by your health care provider. Make sure you discuss any questions you have with your health care provider. Document Revised: 05/02/2020 Document Reviewed: 05/02/2020 Elsevier Patient Education  [place], MD [facility]

## 2022-02-04 NOTE — Assessment & Plan Note (Signed)
Pain management discussed. Take Aleve twice a day and Tylenol as needed for mild to moderate pain.  Take Norco for moderate to severe pain as needed.

## 2022-02-12 ENCOUNTER — Ambulatory Visit (INDEPENDENT_AMBULATORY_CARE_PROVIDER_SITE_OTHER): Payer: BC Managed Care – PPO | Admitting: Nurse Practitioner

## 2022-02-12 ENCOUNTER — Other Ambulatory Visit (HOSPITAL_COMMUNITY)
Admission: RE | Admit: 2022-02-12 | Discharge: 2022-02-12 | Disposition: A | Discharge status: Home / Self Care | Payer: BC Managed Care – PPO | Source: Ambulatory Visit | Attending: Nurse Practitioner | Admitting: Nurse Practitioner

## 2022-02-12 ENCOUNTER — Encounter: Payer: Self-pay | Admitting: Nurse Practitioner

## 2022-02-12 VITALS — BP 132/90 | HR 67 | Ht 60.0 in | Wt 215.0 lb

## 2022-02-12 DIAGNOSIS — Z124 Encounter for screening for malignant neoplasm of cervix: Secondary | ICD-10-CM | POA: Insufficient documentation

## 2022-02-12 DIAGNOSIS — Z0000 Encounter for general adult medical examination without abnormal findings: Secondary | ICD-10-CM

## 2022-02-12 DIAGNOSIS — E041 Nontoxic single thyroid nodule: Secondary | ICD-10-CM | POA: Diagnosis not present

## 2022-02-12 DIAGNOSIS — J4520 Mild intermittent asthma, uncomplicated: Secondary | ICD-10-CM

## 2022-02-12 DIAGNOSIS — J45909 Unspecified asthma, uncomplicated: Secondary | ICD-10-CM | POA: Insufficient documentation

## 2022-02-12 DIAGNOSIS — E039 Hypothyroidism, unspecified: Secondary | ICD-10-CM

## 2022-02-12 NOTE — Assessment & Plan Note (Signed)
Pending TSH outpatient ultrasound order placed today patient given information to call and set up

## 2022-02-12 NOTE — Assessment & Plan Note (Signed)
Patient is concerned with her weight and requested to try phentermine.  Patient does have slightly elevated blood pressure pending labs see if she qualifies for GLP-1 receptor agonist.

## 2022-02-12 NOTE — Assessment & Plan Note (Signed)
Patient currently maintained on levothyroxine 125 mcg daily.  Tolerates medication well.  Pending TSH in office today.

## 2022-02-12 NOTE — Assessment & Plan Note (Signed)
Patient has an albuterol inhaler to use as needed has not had to use it.  Stable

## 2022-02-12 NOTE — Progress Notes (Addendum)
Established Patient Office Visit  Subjective   Patient ID: Carla Wheeler, female    DOB: 01-10-1976  Age: 46 y.o. MRN: [id number]  Chief Complaint  Patient presents with   Annual Exam    HPI  Asthma: states that she has not to use the albuterol at all  Uterine bleeding: Patient currently being followed by GYN.  She is on Myfembree and doing well  Thyroid: Last ultrasound was January 2022.  This showed nodules or acquired follow-up in 1 year.  Patient currently maintained on levothyroxine 125 mcg.  for complete physical and follow up of chronic conditions.  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 Does sanck throughout the day. Will have dinner at home. Coffee in the am. Mtn dew  and water  Exercise: No regular exercise. Once a week wlaking around the block  Eye exam: Completes annually. Wears glasses  Dental exam: Completes semi-annually   Pap Smear: Completed in update today  Mammogram: Completed in  11/12/2021  Colonoscopy: Completed in 2023, repeat 10 years Lung Cancer Screening: N/A Dexa: N/A  Sleep: States goes to bed 10-130 and goes off at 5am. Does not feel rested. Snore      Review of Systems  Constitutional:  Negative for chills and fever.  Respiratory:  Negative for shortness of breath.   Cardiovascular:  Negative for chest pain and leg swelling (early in the morning her hands and feet.).  Gastrointestinal:  Negative for abdominal pain, blood in stool, constipation, diarrhea, nausea and vomiting.       BM daily   Genitourinary:  Negative for dysuria and hematuria.  Neurological:  Negative for headaches.  Psychiatric/Behavioral:  Negative for hallucinations and suicidal ideas.       Objective:     BP (!) 132/90   Pulse 67   Ht 5' (1.524 m)   Wt 215 lb (97.5 kg)   SpO2 98%    BMI 41.99 kg/m    Physical Exam Vitals and nursing note reviewed. Exam conducted with a chaperone present Elizabeth Palau, CMA).  Constitutional:      Appearance: Normal appearance. She is obese.  HENT:     Right Ear: Ear canal and external ear normal. There is impacted cerumen.     Left Ear: Tympanic membrane, ear canal and external ear normal. There is no impacted cerumen.  Cardiovascular:     Rate and Rhythm: Normal rate and regular rhythm.     Heart sounds: Normal heart sounds.  Pulmonary:     Breath sounds: Normal breath sounds.  Abdominal:     General: Bowel sounds are normal. There is no distension.     Palpations: There is no mass.     Tenderness: There is no abdominal tenderness.     Hernia: No hernia is present.  Genitourinary:    Exam position: Lithotomy position.     Vagina: Normal.     Cervix: Cervical bleeding present.     Uterus: Normal.      Adnexa: Right adnexa normal and left adnexa normal.  Musculoskeletal:     Right lower leg: No edema.     Left lower leg: No edema.  Lymphadenopathy:     Cervical: No cervical adenopathy.  Neurological:     General: No focal deficit present.     Mental Status: She is alert.  Psychiatric:  Mood and Affect: Mood normal.        Behavior: Behavior normal.        Thought Content: Thought content normal.        Judgment: Judgment normal.      No results found for any visits on 02/12/22.    The 10-year ASCVD risk score (Arnett DK, et al., 2019) is: 2.1%    Assessment & Plan:   Problem List Items Addressed This Visit       Respiratory   Asthma    Patient has an albuterol inhaler to use as needed has not had to use it.  Stable        Endocrine   Hypothyroidism    Patient currently maintained on levothyroxine 125 mcg daily.  Tolerates medication well.  Pending TSH in office today.      Relevant Orders   TSH   Thyroid nodule    Pending TSH outpatient ultrasound order placed today patient given  information to call and set up      Relevant Orders   TSH   US THYROID     Other   Preventative health care - Primary    Discussed age-appropriate immunizations and screening exams.  Patient was given information at discharge about preventative healthcare maintenance with anticipatory guidance for age range.  Patient is up-to-date on her CRC screening and mammogram.  Updated Pap smear today.  Pending results patient declined STI testing as she is married and monogamous relationship.      Relevant Orders   CBC   Lipid panel   Comprehensive metabolic panel   Hemoglobin A1c   Morbid obesity ([place])    Patient is concerned with her weight and requested to try phentermine.  Patient does have slightly elevated blood pressure pending labs see if she qualifies for GLP-1 receptor agonist.      Relevant Orders   Lipid panel   Hemoglobin A1c   Other Visit Diagnoses     Screening for cervical cancer       Relevant Orders   Cytology - PAP([place])       Return in about 1 year (around 02/13/2023) for CPE and labs.    Romilda Garret, NP

## 2022-02-12 NOTE — Assessment & Plan Note (Signed)
Discussed age-appropriate immunizations and screening exams.  Patient was given information at discharge about preventative healthcare maintenance with anticipatory guidance for age range.  Patient is up-to-date on her CRC screening and mammogram.  Updated Pap smear today.  Pending results patient declined STI testing as she is married and monogamous relationship.

## 2022-02-12 NOTE — Patient Instructions (Signed)
Nice to see you today I will be in touch with the labs once I have them Follow up with me in 1 year for your next physical ,sooner if you need me

## 2022-02-13 LAB — CBC
HCT: 35.5 % — ABNORMAL LOW (ref 36.0–46.0)
Hemoglobin: 11.5 g/dL — ABNORMAL LOW (ref 12.0–15.0)
MCHC: 32.4 g/dL (ref 30.0–36.0)
MCV: 77.8 fl — ABNORMAL LOW (ref 78.0–100.0)
Platelets: 328 10*3/uL (ref 150.0–400.0)
RBC: 4.56 Mil/uL (ref 3.87–5.11)
RDW: 19.8 % — ABNORMAL HIGH (ref 11.5–15.5)
WBC: 5.3 10*3/uL (ref 4.0–10.5)

## 2022-02-13 LAB — LIPID PANEL
Cholesterol: 205 mg/dL — ABNORMAL HIGH (ref 0–200)
HDL: 46 mg/dL (ref >39.00)
LDL Cholesterol: 136 mg/dL — ABNORMAL HIGH (ref 0–99)
NonHDL: 159.46
Total CHOL/HDL Ratio: 4
Triglycerides: 115 mg/dL (ref 0.0–149.0)
VLDL: 23 mg/dL (ref 0.0–40.0)

## 2022-02-13 LAB — COMPREHENSIVE METABOLIC PANEL
ALT: 16 U/L (ref 0–35)
AST: 18 U/L (ref 0–37)
Albumin: 4.2 g/dL (ref 3.5–5.2)
Alkaline Phosphatase: 45 U/L (ref 39–117)
BUN: 12 mg/dL (ref 6–23)
CO2: 27 mEq/L (ref 19–32)
Calcium: 9.2 mg/dL (ref 8.4–10.5)
Chloride: 105 mEq/L (ref 96–112)
Creatinine, Ser: 0.81 mg/dL (ref 0.40–1.20)
GFR: 86.03 mL/min (ref >60.00)
Glucose, Bld: 81 mg/dL (ref 70–99)
Potassium: 4.5 mEq/L (ref 3.5–5.1)
Sodium: 140 mEq/L (ref 135–145)
Total Bilirubin: 0.2 mg/dL (ref 0.2–1.2)
Total Protein: 6.8 g/dL (ref 6.0–8.3)

## 2022-02-13 LAB — HEMOGLOBIN A1C: Hgb A1c MFr Bld: 6.2 % (ref 4.6–6.5)

## 2022-02-13 LAB — TSH: TSH: 4.53 u[IU]/mL (ref 0.35–5.50)

## 2022-02-14 ENCOUNTER — Encounter: Payer: Self-pay | Admitting: Nurse Practitioner

## 2022-02-16 ENCOUNTER — Encounter: Payer: Self-pay | Admitting: Nurse Practitioner

## 2022-02-16 DIAGNOSIS — R7303 Prediabetes: Secondary | ICD-10-CM | POA: Insufficient documentation

## 2022-02-16 DIAGNOSIS — E7800 Pure hypercholesterolemia, unspecified: Secondary | ICD-10-CM

## 2022-02-16 MED ORDER — ZEPBOUND 2.5 MG/0.5ML ~~LOC~~ SOAJ: 2.5000 mg | SUBCUTANEOUS | 0 refills | Status: DC

## 2022-02-17 LAB — CYTOLOGY - PAP
Comment: NEGATIVE
Diagnosis: NEGATIVE
Diagnosis: REACTIVE
High risk HPV: NEGATIVE

## 2022-02-18 NOTE — Telephone Encounter (Signed)
Have you heard of any coupons for commercially insured folks for zepbound?

## 2022-02-20 ENCOUNTER — Telehealth: Payer: Self-pay | Admitting: Nurse Practitioner

## 2022-02-20 ENCOUNTER — Encounter: Payer: Self-pay | Admitting: Nurse Practitioner

## 2022-02-20 DIAGNOSIS — N920 Excessive and frequent menstruation with regular cycle: Secondary | ICD-10-CM

## 2022-02-20 DIAGNOSIS — D219 Benign neoplasm of connective and other soft tissue, unspecified: Secondary | ICD-10-CM

## 2022-02-20 MED ORDER — MYFEMBREE 40-1-0.5 MG PO TABS: 1.0000 | ORAL_TABLET | Freq: Every day | ORAL | 0 refills | Status: DC

## 2022-02-20 NOTE — Telephone Encounter (Signed)
Depending how many she had left they may cover it. Ok to send.

## 2022-02-20 NOTE — Telephone Encounter (Signed)
Pt called in stated she is out of town for the next 5 day with out her medication . Wants to know if RX levothyroxine (SYNTHROID) 125 MCG tablet  and Relugolix-Estradiol-Norethind (MYFEMBREE) 40-1-0.5 MG TABS  be call in to  [facility]  [address] [place]  . Please Advise [phone]

## 2022-02-20 NOTE — Telephone Encounter (Signed)
Per chart patient is not due for refills on these rx's.   Spoke with patient and she states she is out of town for a few days and was hoping she could get a refill on her medications. Advised her [facility] and she can call them to see if she can pick up at [facility]. Also advised if she has picked up these medications recently her insurance will likely not cover an additional fill and she may have to pay out of pocket for the rx's. Patient states she "only needs 5 days worth", again advised she may want to call [facility] and see if her insurance will cover or not. Also recommended [facility] if she has to pay out of pocket, if she finds a cheaper price at another pharmacy she will let us know. Nothing further needed at this time.

## 2022-02-24 ENCOUNTER — Ambulatory Visit
Admission: RE | Admit: 2022-02-24 | Discharge: 2022-02-24 | Disposition: A | Discharge status: Home / Self Care | Payer: BC Managed Care – PPO | Source: Ambulatory Visit | Attending: Nurse Practitioner | Admitting: Nurse Practitioner

## 2022-02-24 DIAGNOSIS — E041 Nontoxic single thyroid nodule: Secondary | ICD-10-CM

## 2022-02-25 ENCOUNTER — Other Ambulatory Visit: Payer: Self-pay | Admitting: Nurse Practitioner

## 2022-02-25 DIAGNOSIS — E041 Nontoxic single thyroid nodule: Secondary | ICD-10-CM

## 2022-02-27 MED ORDER — PHENTERMINE HCL 15 MG PO CAPS: 15.0000 mg | ORAL_CAPSULE | ORAL | 0 refills | Status: DC

## 2022-02-27 NOTE — Telephone Encounter (Signed)
Needs a 1 month follow up please

## 2022-02-27 NOTE — Addendum Note (Signed)
Addended by: Michela Pitcher on: 02/27/2022 01:36 PM   Modules accepted: Orders

## 2022-02-27 NOTE — Telephone Encounter (Signed)
Spoke to pt, scheduled f/u for 03/16/22

## 2022-03-16 ENCOUNTER — Ambulatory Visit: Payer: BC Managed Care – PPO | Admitting: Nurse Practitioner

## 2022-03-30 ENCOUNTER — Ambulatory Visit: Payer: BC Managed Care – PPO | Admitting: Nurse Practitioner

## 2022-03-30 ENCOUNTER — Encounter: Payer: Self-pay | Admitting: Nurse Practitioner

## 2022-03-30 MED ORDER — PHENTERMINE HCL 15 MG PO CAPS: 15.0000 mg | ORAL_CAPSULE | ORAL | 0 refills | Status: DC

## 2022-03-30 NOTE — Assessment & Plan Note (Signed)
Patient is lost 8 pounds since last office visit.  Tolerating phentermine well.  States she can have insomnia if she takes the medication too late in the day.  Will continue phentermine 15 mg for 2 more months.  Patient will work on healthy lifestyle modifications and incorporating exercise.  Praise given in office with her weight loss

## 2022-03-30 NOTE — Progress Notes (Signed)
   Established Patient Office Visit  Subjective   Patient ID: Carla Wheeler, female    DOB: Jun 01, 1975  Age: 47 y.o. MRN: [id number]  Chief Complaint  Patient presents with   Medication Consultation    Follow up     HPI  Obesity: Patient was seen by me on 02/12/2022 or her CPE and discussed weight loss medication . We did try wegovy but the cost was too much. She was started on phentarmine 39m and is here for a follow up   States that she has noticed that she is not feeling that she is straving after work .States before she would stop at a fast food resturant after work then [employer] States that the cravings have been diminsihed. States that she is not snacking as much. If she does snack it is healther. States that she is eating a smaller breakfast to normal meals throughout the day  States that she has not incorperated her exercise. She is doing steps vs an elevator    03/30/2022    4:01 PM 02/04/2022    1:40 PM 11/04/2020    4:41 PM  PHQ9 SCORE ONLY  PHQ-9 Total Score 1 0 2      Review of Systems  Constitutional:  Positive for weight loss. Negative for chills and fever.  Respiratory:  Negative for shortness of breath.   Cardiovascular:  Negative for chest pain and palpitations.  Neurological:  Negative for headaches.  Psychiatric/Behavioral:  Negative for hallucinations and suicidal ideas. The patient has insomnia.       Objective:     BP 136/78   Pulse 74   Temp 98 F (36.7 C)   Resp 16   Ht 5' (1.524 m)   Wt 207 lb (93.9 kg)   LMP 03/11/2022   SpO2 99%   BMI 40.43 kg/m  BP Readings from Last 3 Encounters:  03/30/22 136/78  02/12/22 (!) 132/90  02/04/22 138/84   Wt Readings from Last 3 Encounters:  03/30/22 207 lb (93.9 kg)  02/12/22 215 lb (97.5 kg)  02/04/22 215 lb 2 oz (97.6 kg)      Physical Exam Vitals and nursing note reviewed.  Constitutional:      Appearance: Normal appearance.  Cardiovascular:     Rate and Rhythm: Normal rate and  regular rhythm.     Heart sounds: Normal heart sounds.  Pulmonary:     Effort: Pulmonary effort is normal.     Breath sounds: Normal breath sounds.  Neurological:     Mental Status: She is alert.      No results found for any visits on 03/30/22.    The 10-year ASCVD risk score (Arnett DK, et al., 2019) is: 2.7%    Assessment & Plan:   Problem List Items Addressed This Visit       Other   Morbid obesity ([place] [facility]    Patient is lost 8 pounds since last office visit.  Tolerating phentermine well.  States she can have insomnia if she takes the medication too late in the day.  Will continue phentermine 15 mg for 2 more months.  Patient will work on healthy lifestyle modifications and incorporating exercise.  Praise given in office with her weight loss      Relevant Medications   phentermine 15 MG capsule    Return in about 4 weeks (around 04/27/2022) for Weight recheck and medication.    MRomilda Garret NP

## 2022-04-29 ENCOUNTER — Ambulatory Visit: Payer: BC Managed Care – PPO | Admitting: Nurse Practitioner

## 2022-05-05 ENCOUNTER — Encounter: Payer: Self-pay | Admitting: Nurse Practitioner

## 2022-05-27 ENCOUNTER — Ambulatory Visit: Payer: BC Managed Care – PPO | Admitting: Nurse Practitioner

## 2022-07-22 ENCOUNTER — Ambulatory Visit: Payer: BC Managed Care – PPO | Admitting: Radiology

## 2022-07-24 ENCOUNTER — Encounter: Payer: Self-pay | Admitting: Radiology

## 2022-07-24 ENCOUNTER — Ambulatory Visit (INDEPENDENT_AMBULATORY_CARE_PROVIDER_SITE_OTHER): Payer: BC Managed Care – PPO | Admitting: Radiology

## 2022-07-24 VITALS — BP 138/86 | Ht 60.0 in | Wt 207.0 lb

## 2022-07-24 DIAGNOSIS — D219 Benign neoplasm of connective and other soft tissue, unspecified: Secondary | ICD-10-CM

## 2022-07-24 DIAGNOSIS — N920 Excessive and frequent menstruation with regular cycle: Secondary | ICD-10-CM

## 2022-07-24 DIAGNOSIS — Z01419 Encounter for gynecological examination (general) (routine) without abnormal findings: Secondary | ICD-10-CM | POA: Diagnosis not present

## 2022-07-24 MED ORDER — MYFEMBREE 40-1-0.5 MG PO TABS: 1.0000 | ORAL_TABLET | Freq: Every day | ORAL | 0 refills | Status: AC

## 2022-07-24 NOTE — Progress Notes (Signed)
Carla Wheeler May 23, 1975 [id number]   History:  47 y.o. G6P3 presents for annual exam. Doing well on Myfembree. Has had periods during months where she missed doses, otherwise menorrhagia has resolved. Needs new rx. No new concerns.  Gynecologic History Patient's last menstrual period was 06/24/2022 (exact date). Period Duration (Days): 6 Period Pattern: (!) Irregular Menstrual Flow: Heavy Menstrual Control: Tampon, Maxi pad Dysmenorrhea: (!) Moderate Dysmenorrhea Symptoms: Cramping Sexually active: yes Last Pap: 1/24. Results were: normal Last mammogram: 10/23. Results were: normal  Obstetric History OB History  Gravida Para Term Preterm AB Living  6 3       3   SAB IAB Ectopic Multiple Live Births               # Outcome Date GA Lbr Len/2nd Weight Sex Delivery Anes PTL Lv  6 Gravida           5 Gravida           4 Gravida           3 Para           2 Para           1 Para              The following portions of the patient's history were reviewed and updated as appropriate: allergies, current medications, past family history, past medical history, past social history, past surgical history, and problem list.  Review of Systems Pertinent items noted in HPI and remainder of comprehensive ROS otherwise negative.   Past medical history, past surgical history, family history and social history were all reviewed and documented in the EPIC chart.   Exam:  Vitals:   07/24/22 1102  BP: 138/86  Weight: 207 lb (93.9 kg)  Height: 5' (1.524 m)   Body mass index is 40.43 kg/m.  General appearance:  Normal Thyroid:  Symmetrical, normal in size, without palpable masses or nodularity. Respiratory  Auscultation:  Clear without wheezing or rhonchi Cardiovascular  Auscultation:  Regular rate, without rubs, murmurs or gallops  Edema/varicosities:  Not grossly evident Abdominal  Soft,nontender, without masses, guarding or rebound.  Liver/spleen:  No organomegaly  noted  Hernia:  None appreciated  Skin  Inspection:  Grossly normal Breasts: Examined lying and sitting.   Right: Without masses, retractions, nipple discharge or axillary adenopathy.   Left: Without masses, retractions, nipple discharge or axillary adenopathy. Genitourinary   Inguinal/mons:  Normal without inguinal adenopathy  External genitalia:  Normal appearing vulva with no masses, tenderness, or lesions  BUS/Urethra/Skene's glands:  Normal without masses or exudate  Vagina:  Normal appearing with normal color and discharge, no lesions  Cervix:  Normal appearing without discharge or lesions  Uterus:  Normal in size, shape and contour.  Mobile, nontender  Adnexa/parametria:     Rt: Normal in size, without masses or tenderness.   Lt: Normal in size, without masses or tenderness.  Anus and perineum: Normal   Raynelle Fanning, CMA present for exam  Assessment/Plan:   1. Well woman exam with routine gynecological exam Pap normal 1/14 with PCP  2. Fibroids - Relugolix-Estradiol-Norethind (MYFEMBREE) 40-1-0.5 MG TABS; Take 1 tablet by mouth daily.  Dispense: 90 tablet; Refill: 0  3. Menorrhagia with regular cycle - Relugolix-Estradiol-Norethind (MYFEMBREE) 40-1-0.5 MG TABS; Take 1 tablet by mouth daily.  Dispense: 90 tablet; Refill: 0     Discussed SBE, colonoscopy and DEXA screening as directed/appropriate. Recommend of exercise weekly, including weight bearing exercise. Encouraged  the use of seatbelts and sunscreen. Return in 1 year for annual or as needed.   Tanda Rockers WHNP-BC 11:39 AM 07/24/2022

## 2022-08-03 ENCOUNTER — Encounter: Payer: Self-pay | Admitting: Nurse Practitioner

## 2022-09-07 ENCOUNTER — Telehealth: Payer: Self-pay | Admitting: Nurse Practitioner

## 2022-09-07 ENCOUNTER — Ambulatory Visit
Admission: EM | Admit: 2022-09-07 | Discharge: 2022-09-07 | Disposition: A | Discharge status: Home / Self Care | Payer: BC Managed Care – PPO | Attending: Urgent Care | Admitting: Urgent Care

## 2022-09-07 DIAGNOSIS — J4521 Mild intermittent asthma with (acute) exacerbation: Secondary | ICD-10-CM | POA: Diagnosis not present

## 2022-09-07 MED ORDER — PREDNISONE 20 MG PO TABS: 60.0000 mg | ORAL_TABLET | Freq: Every day | ORAL | 0 refills | Status: AC

## 2022-09-07 MED ORDER — ALBUTEROL SULFATE HFA 108 (90 BASE) MCG/ACT IN AERS: 1.0000 | INHALATION_SPRAY | Freq: Four times a day (QID) | RESPIRATORY_TRACT | 0 refills | Status: DC | PRN

## 2022-09-07 NOTE — Telephone Encounter (Signed)
Phone call from access nurse to see if any appointment availability for today.  Their recommendation was for pt to be seen in 4 hrs.  No appointments here or at [facility]. Pt to go to [facility].

## 2022-09-07 NOTE — ED Triage Notes (Signed)
Patient to Urgent Care with complaints of dry/ unproductive cough that started approx one month ago.  Reports difficulty catching her breath worse w/ exertion but husband has told her she is snoring and appears to get [facility] at rest. States that she has noticed she has to stop to talk to catch her breath or cough. Sleeping elevated with a pillow.

## 2022-09-07 NOTE — Telephone Encounter (Signed)
Noted. Agree for patient to be evaluated

## 2022-09-07 NOTE — ED Provider Notes (Signed)
Carla Wheeler    CSN: [id number] Arrival date & time: 09/07/22  1040      History   Chief Complaint Chief Complaint  Patient presents with   Cough    HPI MADALEN Wheeler is a 47 y.o. female.    Cough  Presenting to urgent care with complaint of dry unproductive cough starting 1 month ago.  Reports shortness of breath, difficulty "catching my breath", symptoms worse with exertion.  Her husband reports snoring and sometimes shortness of breath at rest.  She states she has to "stop to talk to catch her breath".  She is sleeping with head elevated.  Chart review indicates history of asthma.  MAR with prescription for albuterol inhaler and via nebulizer.  She reported to another provider that she has never had to use the inhaler.    02/17/2020 PCP visit for acute exacerbation in the midst of COVID infection, treated with prednisone taper.    Seen in the ED on 02/18/2020 for 4-day history of cough, fatigue, rhinorrhea, chest pain (COVID positive).  Discharged with benzonatate.    She reported on 02/23/2020 that symptoms did not improve with treatment.  Discharged with Flovent and Tussionex.   Past Medical History:  Diagnosis Date   Acute cough 12/16/2020   Anemia    Asthma    Body aches 12/16/2020   Depression    Mild intermittent asthma with acute exacerbation 07/19/2015   Thyroid disease     Patient Active Problem List   Diagnosis Date Noted   Pure hypercholesterolemia 02/16/2022   Prediabetes 02/16/2022   Morbid obesity (HCC) 02/12/2022   Thyroid nodule 02/12/2022   Asthma 02/12/2022   Viral respiratory infection 02/04/2022   Generalized body aches 02/04/2022   Flu-like symptoms 02/04/2022   Upper respiratory tract infection 11/06/2021   Other headache syndrome 09/10/2021   Fever and chills 09/10/2021   Menorrhagia with irregular cycle 07/09/2021   Abnormal menstruation 07/09/2021   Preventative health care    Sore throat 12/16/2020   Acute cough  12/16/2020   Body aches 12/16/2020   Obesity (BMI 35.0-39.9 without comorbidity) 11/04/2020   Paresthesia 11/04/2020   Anemia, iron deficiency 07/19/2015   Hypothyroidism 07/21/2013   Anxiety and depression 07/15/2011    Past Surgical History:  Procedure Laterality Date   CESAREAN SECTION     COLONOSCOPY WITH PROPOFOL N/A 03/20/2021   Procedure: COLONOSCOPY WITH PROPOFOL;  Surgeon: Toney Reil, MD;  Location: [facility] ENDOSCOPY;  Service: Gastroenterology;  Laterality: N/A;   TUBAL LIGATION  2005    OB History     Gravida  6   Para  3   Term      Preterm      AB      Living  3      SAB      IAB      Ectopic      Multiple      Live Births               Home Medications    Prior to Admission medications   Medication Sig Start Date End Date Taking? Authorizing Provider  albuterol (VENTOLIN HFA) 108 (90 Base) MCG/ACT inhaler TAKE 2 PUFFS BY MOUTH EVERY 6 HOURS AS NEEDED FOR WHEEZE OR SHORTNESS OF BREATH 02/17/20   Bedsole, Amy E, MD  Ferrous Sulfate (IRON PO) Take by mouth.    [provider]  fluticasone (FLONASE) 50 MCG/ACT nasal spray Place 2 sprays into both nostrils daily. 11/06/21  Eden Emms, NP  levothyroxine (SYNTHROID) 125 MCG tablet TAKE 1 TABLET BY MOUTH DAILY BEFORE BREAKFAST. 02/03/22   Eden Emms, NP  Relugolix-Estradiol-Norethind (MYFEMBREE) 40-1-0.5 MG TABS Take 1 tablet by mouth daily. 07/24/22 10/22/22  Chrzanowski, Lamona Curl, NP    Family History Family History  Problem Relation Age of Onset   Cancer Mother 9       Breast   Arthritis Father    Heart disease Father    Stroke Father    Hypertension Father    Diabetes Father    Congestive Heart Failure Father    Heart attack Father    Cancer Maternal Grandmother        Breast   Hypertension Maternal Grandmother    Hypertension Paternal Grandmother    Learning disabilities Paternal Grandmother     Social History Social History   Tobacco Use   Smoking status:  Former    Types: Cigars   Smokeless tobacco: Never  Vaping Use   Vaping status: Never Used  Substance Use Topics   Alcohol use: Yes    Alcohol/week: 3.0 standard drinks of alcohol    Types: 3 Glasses of wine per week    Comment: occasional   Drug use: No     Allergies   Augmentin [amoxicillin-pot clavulanate]   Review of Systems Review of Systems  Respiratory:  Positive for cough.      Physical Exam Triage Vital Signs ED Triage Vitals [09/07/22 1048]  Encounter Vitals Group     BP      Systolic BP Percentile      Diastolic BP Percentile      Pulse      Resp      Temp      Temp src      SpO2      Weight      Height      Head Circumference      Peak Flow      Pain Score 0     Pain Loc      Pain Education      Exclude from Growth Chart    No data found.  Updated Vital Signs   Visual Acuity Right Eye Distance:   Left Eye Distance:   Bilateral Distance:    Right Eye Near:   Left Eye Near:    Bilateral Near:     Physical Exam Vitals reviewed.  Constitutional:      Appearance: Normal appearance.  Cardiovascular:     Rate and Rhythm: Normal rate and regular rhythm.     Pulses: Normal pulses.     Heart sounds: Normal heart sounds.  Pulmonary:     Effort: Pulmonary effort is normal.     Breath sounds: Normal breath sounds.  Skin:    General: Skin is warm and dry.  Neurological:     General: No focal deficit present.     Mental Status: She is alert and oriented to person, place, and time.  Psychiatric:        Mood and Affect: Mood normal.        Behavior: Behavior normal.      UC Treatments / Results  Labs (all labs ordered are listed, but only abnormal results are displayed) Labs Reviewed - No data to display  EKG   Radiology No results found.  Procedures Procedures (including critical care time)  Medications Ordered in UC Medications - No data to display  Initial Impression / Assessment and Plan /  UC Course  I have reviewed  the triage vital signs and the nursing notes.  Pertinent labs & imaging results that were available during my care of the patient were reviewed by me and considered in my medical decision making (see chart for details).   Carla Wheeler is a 47 y.o. female presenting with cough. Patient is afebrile without recent antipyretics, satting well on room air. Overall is well appearing, well hydrated, without respiratory distress. Pulmonary exam is remarkable only for dry frequent cough throughout the visit.  Lungs CTAB without wheezing, rhonchi, rales. RRR without murmurs, rubs, gallops.  Reviewed relevant chart history.   Suspect exacerbation of asthma due to unknown trigger.  Will prescribe prednisone as well as represcribe albuterol inhaler.  Patient will follow-up with her PCP.  Counseled patient on potential for adverse effects with medications prescribed/recommended today, ER and return-to-clinic precautions discussed, patient verbalized understanding and agreement with care plan.  Final Clinical Impressions(s) / UC Diagnoses   Final diagnoses:  None   Discharge Instructions   None    ED Prescriptions   None    PDMP not reviewed this encounter.   Charma Igo, [place] 09/07/22 1105

## 2022-09-07 NOTE — Discharge Instructions (Signed)
Schedule a follow-up visit with your primary care provider to assess the need for step up of treatment.

## 2022-09-07 NOTE — Telephone Encounter (Signed)
FYI: This call has been transferred to Access Nurse. Once the result note has been entered staff can address the message at that time.  Patient called in with the following symptoms:  Red Word: SOB, trouble catching breath, ongoing for 3wks    Please advise at Mobile [phone] (mobile)  Message is routed to Provider Pool and [facility] Triage

## 2022-10-13 ENCOUNTER — Encounter: Payer: Self-pay | Admitting: Nurse Practitioner

## 2022-10-14 MED ORDER — LEVOTHYROXINE SODIUM 125 MCG PO TABS: 125.0000 ug | ORAL_TABLET | Freq: Every day | ORAL | 0 refills | Status: DC

## 2022-10-15 NOTE — Telephone Encounter (Signed)
Patient will c/b and schedule

## 2022-11-18 LAB — HM MAMMOGRAPHY

## 2022-11-20 ENCOUNTER — Encounter: Payer: Self-pay | Admitting: Nurse Practitioner

## 2022-12-15 ENCOUNTER — Encounter: Payer: Self-pay | Admitting: Nurse Practitioner

## 2022-12-15 ENCOUNTER — Other Ambulatory Visit: Payer: Self-pay | Admitting: Nurse Practitioner

## 2022-12-15 ENCOUNTER — Telehealth: Payer: Self-pay

## 2022-12-15 ENCOUNTER — Other Ambulatory Visit: Payer: Self-pay | Admitting: Radiology

## 2022-12-15 DIAGNOSIS — D219 Benign neoplasm of connective and other soft tissue, unspecified: Secondary | ICD-10-CM

## 2022-12-15 MED ORDER — MYFEMBREE 40-1-0.5 MG PO TABS: 1.0000 | ORAL_TABLET | Freq: Every day | ORAL | 6 refills | Status: DC

## 2022-12-15 NOTE — Telephone Encounter (Signed)
See my chart request

## 2022-12-15 NOTE — Telephone Encounter (Signed)
Prior authorization received from covermymeds for myfembree. PA initiated.  Patient notified KEY: [id number], D21.9 uterine fibroids.

## 2022-12-16 NOTE — Telephone Encounter (Signed)
PA for Myfembree approved.  Patient informed.

## 2022-12-18 ENCOUNTER — Other Ambulatory Visit: Payer: Self-pay | Admitting: Nurse Practitioner

## 2022-12-18 NOTE — Telephone Encounter (Signed)
LAST APPOINTMENT DATE: 03/30/22  NEXT APPOINTMENT DATE: Visit date not found   Synthroid 125 mcg   LAST REFILL: 10/14/22  QTY: #90 0RF

## 2023-02-08 ENCOUNTER — Encounter: Payer: Self-pay | Admitting: Oncology

## 2023-02-11 ENCOUNTER — Encounter: Payer: Self-pay | Admitting: Oncology

## 2023-02-11 ENCOUNTER — Encounter: Payer: Self-pay | Admitting: *Deleted

## 2023-03-16 ENCOUNTER — Other Ambulatory Visit: Payer: Self-pay | Admitting: Radiology

## 2023-03-16 DIAGNOSIS — D219 Benign neoplasm of connective and other soft tissue, unspecified: Secondary | ICD-10-CM

## 2023-03-17 NOTE — Telephone Encounter (Signed)
 Med refill request: myfembree  40-1-0.5 mg Last AEX: 07-24-22 Next AEX: not scheduled  Last MMG (if hormonal med) 11-18-22 birads cat 1 neg Refill authorized: rx sent 12-15-22 #28 with 6 refills. Pharmacy is requesting 90 day refill. Please approve or deny

## 2023-03-29 IMAGING — US US PELVIS COMPLETE WITH TRANSVAGINAL
1 series · 13 of 25 positions shown · non-contrast
Comparison: 07/14/2017

CLINICAL DATA: Menorrhagia, heavy menstrual bleeding, LMP
06/20/2021

EXAM:
TRANSABDOMINAL AND TRANSVAGINAL ULTRASOUND OF PELVIS
TECHNIQUE: Both transabdominal and transvaginal ultrasound examinations of the
pelvis were performed. Transabdominal technique was performed for
global imaging of the pelvis including uterus, ovaries, adnexal
regions, and pelvic cul-de-sac. It was necessary to proceed with
endovaginal exam following the transabdominal exam to visualize the
endometrium and ovaries.

[Series 1: us pelvis complete with transvaginal · 0.23mm/px · 13 of 56 slices shown]
[im 1/56]
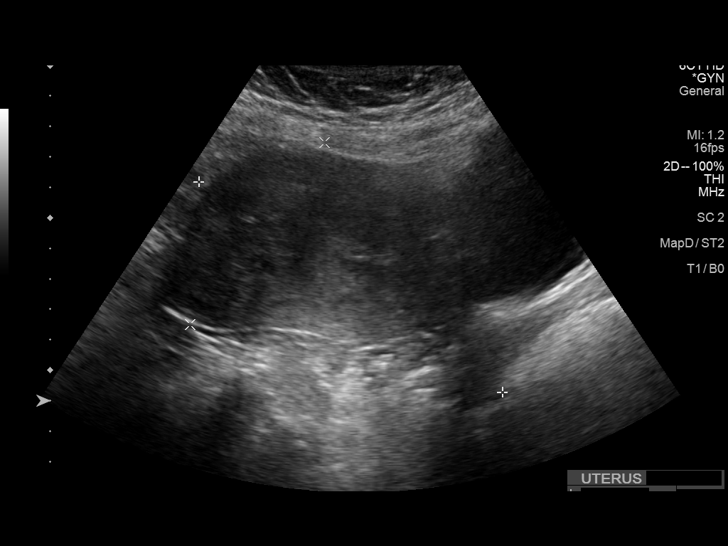
[im 5/56]
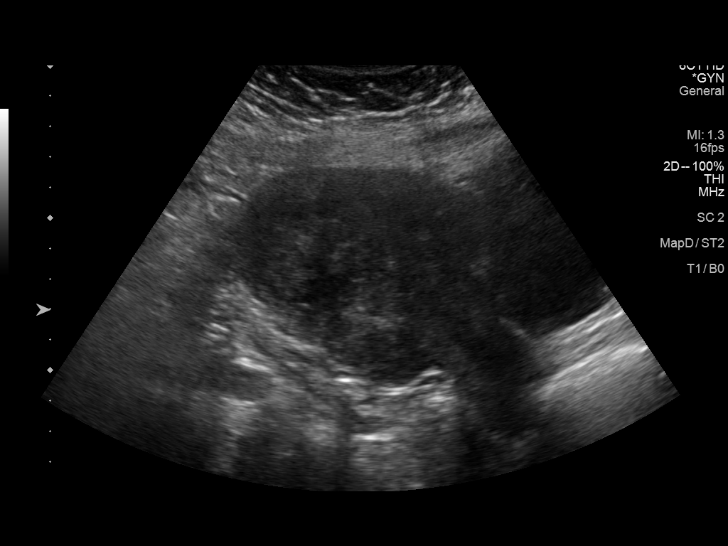
[im 10/56]
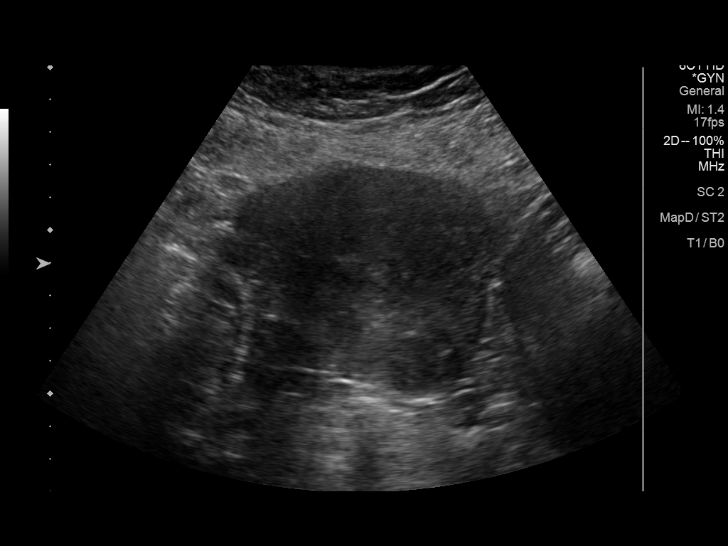
[im 14/56]
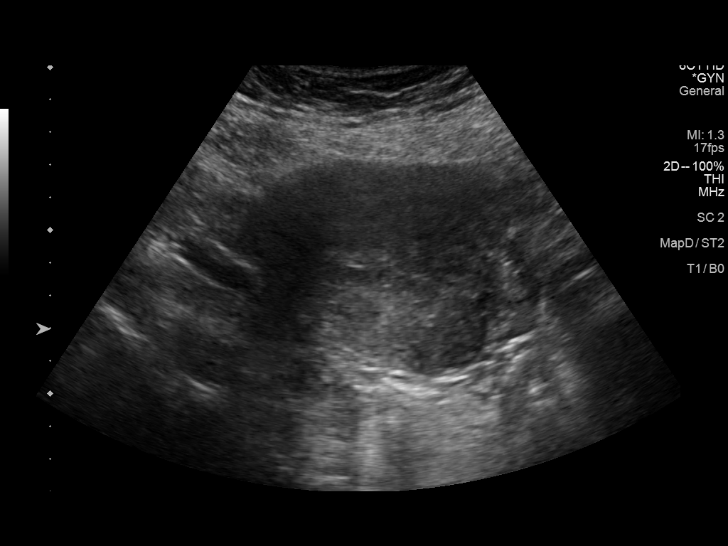
[im 19/56]
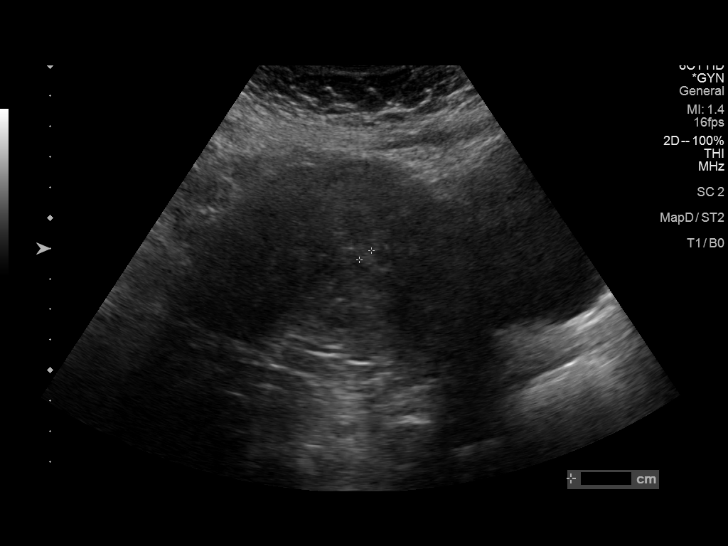
[im 23/56]
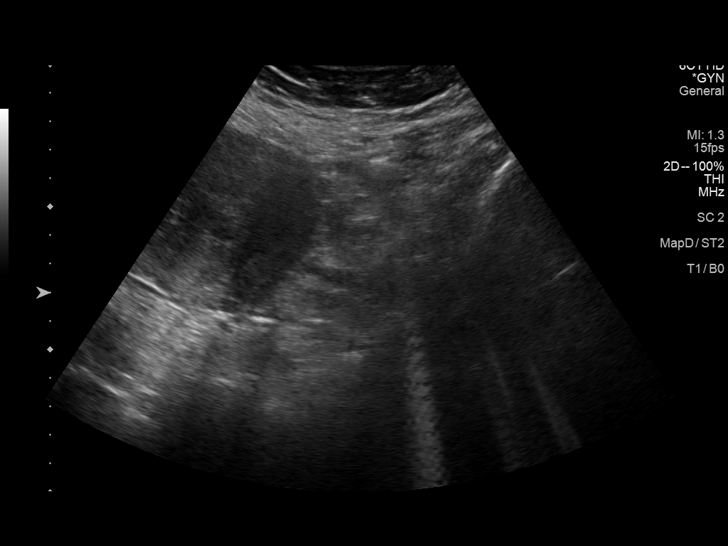
[im 28/56]
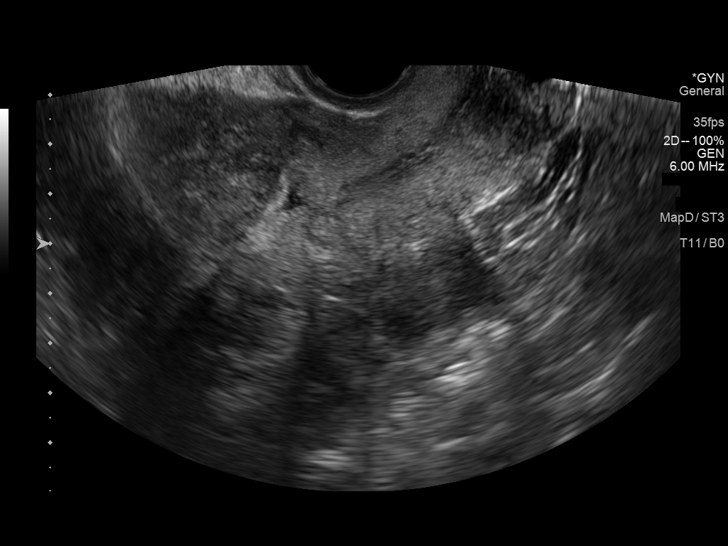
[im 33/56]
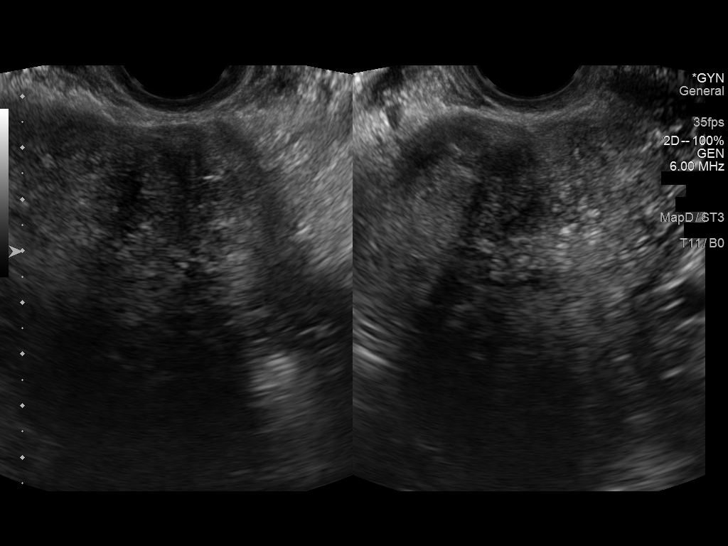
[im 37/56]
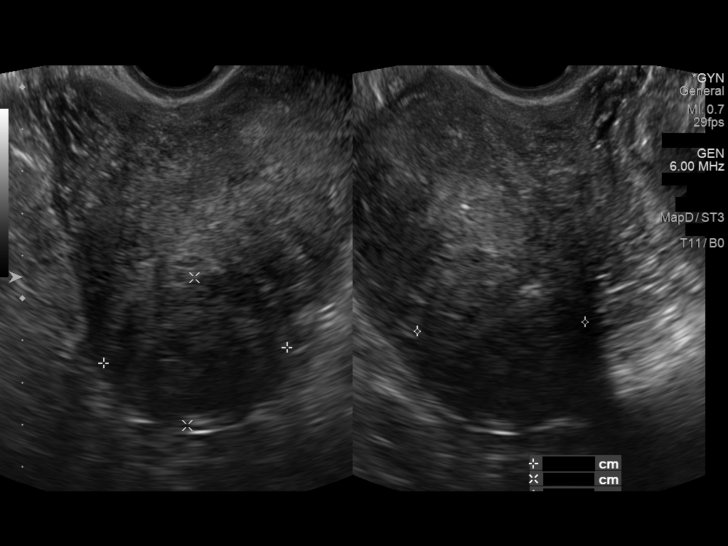
[im 42/56]
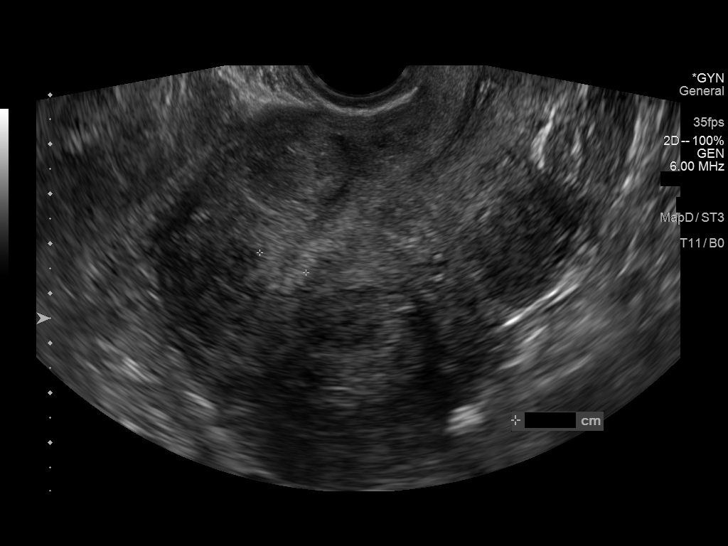
[im 46/56]
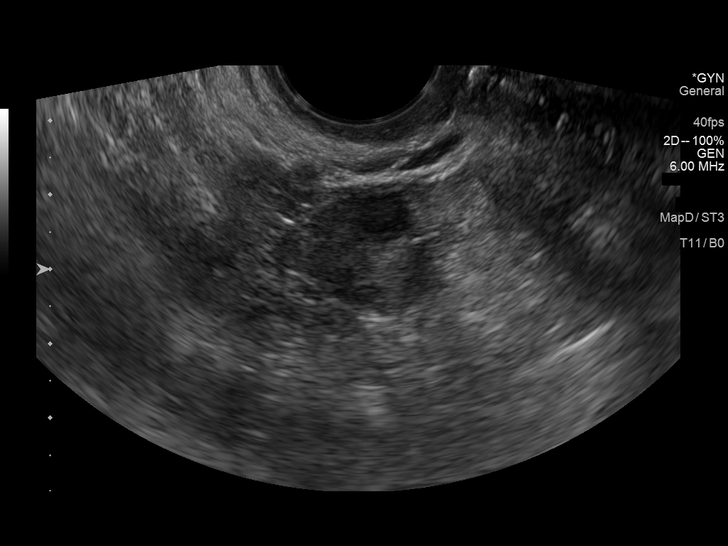
[im 51/56]
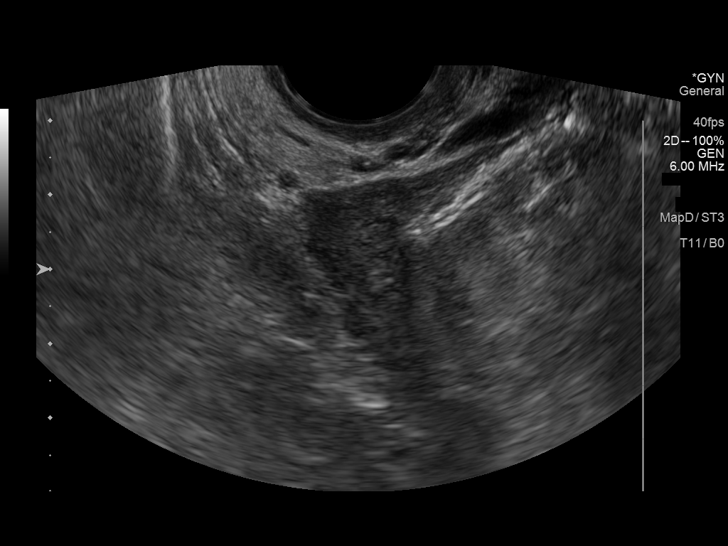
[im 56/56]
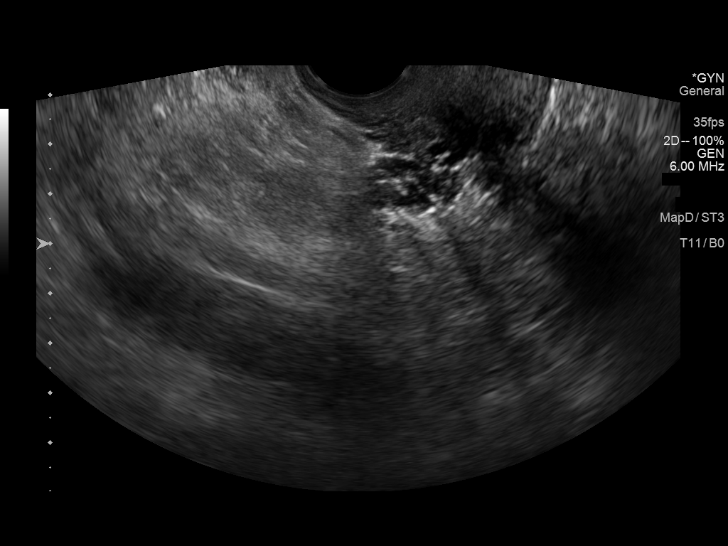

[13 of 25 positions shown; findings below may reference images not displayed]

FINDINGS: Uterus

Measurements: 12.1 x 7.4 x 8.0 cm = volume: 378 mL. Anteverted.
Mildly enlarged and heterogeneous containing multiple nodules
consistent with leiomyomata. Largest of these measure 2.8 cm
intramural LEFT fundal, 4.2 cm subserosal posterior mid uterus, and
4.4 cm subserosal posterior mid uterus.

Endometrium

Thickness: 10 mm.  No endometrial fluid or mass

Right ovary

Measurements: 2.4 x 1.1 x 1.6 cm = volume: 2.2 mL. Normal morphology
without mass

Left ovary

Not visualized, likely obscured by bowel

Other findings

No free pelvic fluid or adnexal masses.
IMPRESSION: At least 3 uterine leiomyomata as above.

Nonvisualization of LEFT ovary.

Unremarkable endometrial complex and RIGHT ovary.

## 2023-05-10 ENCOUNTER — Encounter: Payer: Self-pay | Admitting: Nurse Practitioner

## 2023-07-08 ENCOUNTER — Other Ambulatory Visit: Payer: Self-pay | Admitting: Nurse Practitioner

## 2023-07-27 ENCOUNTER — Encounter: Payer: Self-pay | Admitting: Nurse Practitioner

## 2023-10-01 ENCOUNTER — Other Ambulatory Visit: Payer: Self-pay | Admitting: Radiology

## 2023-10-01 DIAGNOSIS — D219 Benign neoplasm of connective and other soft tissue, unspecified: Secondary | ICD-10-CM

## 2023-10-01 NOTE — Telephone Encounter (Signed)
 Med refill request: Myfembree  Last AEX: 07/24/22 Next AEX: none scheduled Message sent to front desk to schedule AEX. Last MMG (if hormonal med) n/a Last refill: 08/21/2023 Refill authorized: Please Advise?

## 2023-10-06 ENCOUNTER — Other Ambulatory Visit: Payer: Self-pay | Admitting: Nurse Practitioner

## 2023-10-12 ENCOUNTER — Ambulatory Visit (INDEPENDENT_AMBULATORY_CARE_PROVIDER_SITE_OTHER): Payer: Self-pay | Admitting: Radiology

## 2023-10-12 ENCOUNTER — Encounter: Payer: Self-pay | Admitting: Oncology

## 2023-10-12 ENCOUNTER — Encounter: Payer: Self-pay | Admitting: Radiology

## 2023-10-12 VITALS — BP 124/82 | HR 82 | Ht 60.5 in | Wt 209.0 lb

## 2023-10-12 DIAGNOSIS — N920 Excessive and frequent menstruation with regular cycle: Secondary | ICD-10-CM

## 2023-10-12 DIAGNOSIS — Z01419 Encounter for gynecological examination (general) (routine) without abnormal findings: Secondary | ICD-10-CM

## 2023-10-12 DIAGNOSIS — Z1331 Encounter for screening for depression: Secondary | ICD-10-CM

## 2023-10-12 DIAGNOSIS — D219 Benign neoplasm of connective and other soft tissue, unspecified: Secondary | ICD-10-CM | POA: Diagnosis not present

## 2023-10-12 MED ORDER — MYFEMBREE 40-1-0.5 MG PO TABS: 1.0000 | ORAL_TABLET | Freq: Every day | ORAL | 11 refills | Status: AC

## 2023-10-12 NOTE — Progress Notes (Signed)
 Carla Wheeler 1975/12/13 [id number]   History:  48 y.o. G6P3 presents for annual exam. Doing well on Myfembree  for fibroids. Bleeding well controlled. No other gyn concerns. Up to date with screenings.  Gynecologic History No LMP recorded. (Menstrual status: Other).    Last Pap: 2024. Results were: normal Last mammogram: 10/24. Results were: normal  Obstetric History OB History  Gravida Para Term Preterm AB Living  6 3    3   SAB IAB Ectopic Multiple Live Births          # Outcome Date GA Lbr Len/2nd Weight Sex Type Anes PTL Lv  6 Gravida           5 Gravida           4 Gravida           3 Para           2 Para           1 Para                10/12/2023    1:47 PM 03/30/2022    4:01 PM 02/04/2022    1:40 PM  Depression screen PHQ 2/9  Decreased Interest 0 0 0  Down, Depressed, Hopeless 0 0 0  PHQ - 2 Score 0 0 0  Altered sleeping  1   Tired, decreased energy  0   Change in appetite  0   Feeling bad or failure about yourself   0   Trouble concentrating  0   Moving slowly or fidgety/restless  0   Suicidal thoughts  0   PHQ-9 Score  1   Difficult doing work/chores  Somewhat difficult      The following portions of the patient's history were reviewed and updated as appropriate: allergies, current medications, past family history, past medical history, past social history, past surgical history, and problem list.  Review of Systems  Constitutional: Negative.   Cardiovascular: Negative.   Gastrointestinal:  Negative for abdominal pain, blood in stool, constipation, nausea and vomiting.  Genitourinary: Negative.  Negative for dysuria, flank pain, frequency, hematuria and urgency.  Endo/Heme/Allergies:  Does not bruise/bleed easily.  Psychiatric/Behavioral: Negative.  Negative for depression, memory loss and substance abuse. The patient is not nervous/anxious and does not have insomnia.     Past medical history, past surgical history, family history and social  history were all reviewed and documented in the EPIC chart.  Exam:  Vitals:   10/12/23 1347  BP: 124/82  Pulse: 82  SpO2: 96%  Weight: 209 lb (94.8 kg)  Height: 5' 0.5 (1.537 m)   Body mass index is 40.15 kg/m.  Physical Exam Vitals and nursing note reviewed. Exam conducted with a chaperone present.  Constitutional:      Appearance: Normal appearance. She is obese.  HENT:     Head: Normocephalic and atraumatic.  Neck:     Thyroid : No thyroid  mass, thyromegaly or thyroid  tenderness.  Cardiovascular:     Rate and Rhythm: Regular rhythm.     Heart sounds: Normal heart sounds.  Pulmonary:     Effort: Pulmonary effort is normal.     Breath sounds: Normal breath sounds.  Chest:  Breasts:    Breasts are symmetrical.     Right: Normal. No inverted nipple, mass, nipple discharge, skin change or tenderness.     Left: Normal. No inverted nipple, mass, nipple discharge, skin change or tenderness.  Abdominal:     General: Abdomen is  flat. Bowel sounds are normal.     Palpations: Abdomen is soft.  Genitourinary:    General: Normal vulva.     Vagina: Normal. No vaginal discharge, bleeding or lesions.     Cervix: Normal. No discharge or lesion.     Uterus: Normal. Not enlarged and not tender.      Adnexa: Right adnexa normal and left adnexa normal.       Right: No mass, tenderness or fullness.         Left: No mass, tenderness or fullness.    Lymphadenopathy:     Upper Body:     Right upper body: No axillary adenopathy.     Left upper body: No axillary adenopathy.  Skin:    General: Skin is warm and dry.  Neurological:     Mental Status: She is alert and oriented to person, place, and time.  Psychiatric:        Mood and Affect: Mood normal.        Thought Content: Thought content normal.        Judgment: Judgment normal.      Darice Hoit, CMA present for exam  Assessment/Plan:   1. Well woman exam with routine gynecological exam (Primary) Pap 2027  2. Fibroids -  Relugolix-Estradiol-Norethind (MYFEMBREE ) 40-1-0.5 MG TABS; Take 1 tablet by mouth daily.  Dispense: 28 tablet; Refill: 11  3. Menorrhagia with regular cycle - Relugolix-Estradiol-Norethind (MYFEMBREE ) 40-1-0.5 MG TABS; Take 1 tablet by mouth daily.  Dispense: 28 tablet; Refill: 11  4. Depression screen     Return in about 1 year (around 10/11/2024) for Annual.  Jakobie Henslee B WHNP-BC 2:00 PM 10/12/2023

## 2023-10-12 NOTE — Patient Instructions (Signed)
 Preventive Care 50-50 Years Old, Female  Preventive care refers to lifestyle choices and visits with your health care provider that can promote health and wellness. Preventive care visits are also called wellness exams.  What can I expect for my preventive care visit?  Counseling  Your health care provider may ask you questions about your:  Medical history, including:  Past medical problems.  Family medical history.  Pregnancy history.  Current health, including:  Menstrual cycle.  Method of birth control.  Emotional well-being.  Home life and relationship well-being.  Sexual activity and sexual health.  Lifestyle, including:  Alcohol, nicotine or tobacco, and drug use.  Access to firearms.  Diet, exercise, and sleep habits.  Work and work Astronomer.  Sunscreen use.  Safety issues such as seatbelt and bike helmet use.  Physical exam  Your health care provider will check your:  Height and weight. These may be used to calculate your BMI (body mass index). BMI is a measurement that tells if you are at a healthy weight.  Waist circumference. This measures the distance around your waistline. This measurement also tells if you are at a healthy weight and may help predict your risk of certain diseases, such as type 2 diabetes and high blood pressure.  Heart rate and blood pressure.  Body temperature.  Skin for abnormal spots.  What immunizations do I need?    Vaccines are usually given at various ages, according to a schedule. Your health care provider will recommend vaccines for you based on your age, medical history, and lifestyle or other factors, such as travel or where you work.  What tests do I need?  Screening  Your health care provider may recommend screening tests for certain conditions. This may include:  Lipid and cholesterol levels.  Diabetes screening. This is done by checking your blood sugar (glucose) after you have not eaten for a while (fasting).  Pelvic exam and Pap test.  Hepatitis B test.  Hepatitis C  test.  HIV (human immunodeficiency virus) test.  STI (sexually transmitted infection) testing, if you are at risk.  Lung cancer screening.  Colorectal cancer screening.  Mammogram. Talk with your health care provider about when you should start having regular mammograms. This may depend on whether you have a family history of breast cancer.  BRCA-related cancer screening. This may be done if you have a family history of breast, ovarian, tubal, or peritoneal cancers.  Bone density scan. This is done to screen for osteoporosis.  Talk with your health care provider about your test results, treatment options, and if necessary, the need for more tests.  Follow these instructions at home:  Eating and drinking    Eat a diet that includes fresh fruits and vegetables, whole grains, lean protein, and low-fat dairy products.  Take vitamin and mineral supplements as recommended by your health care provider.  Do not drink alcohol if:  Your health care provider tells you not to drink.  You are pregnant, may be pregnant, or are planning to become pregnant.  If you drink alcohol:  Limit how much you have to 0-1 drink a day.  Know how much alcohol is in your drink. In the [place] one drink equals one 12 oz bottle of beer (355 mL), one 5 oz glass of wine (148 mL), or one 1 oz glass of hard liquor (44 mL).  Lifestyle  Brush your teeth every morning and night with fluoride toothpaste. Floss one time each day.  Exercise for at least  30 minutes 5 or more days each week.  Do not use any products that contain nicotine or tobacco. These products include cigarettes, chewing tobacco, and vaping devices, such as e-cigarettes. If you need help quitting, ask your health care provider.  Do not use drugs.  If you are sexually active, practice safe sex. Use a condom or other form of protection to prevent STIs.  If you do not wish to become pregnant, use a form of birth control. If you plan to become pregnant, see your health care provider for a  prepregnancy visit.  Take aspirin only as told by your health care provider. Make sure that you understand how much to take and what form to take. Work with your health care provider to find out whether it is safe and beneficial for you to take aspirin daily.  Find healthy ways to manage stress, such as:  Meditation, yoga, or listening to music.  Journaling.  Talking to a trusted person.  Spending time with friends and family.  Minimize exposure to UV radiation to reduce your risk of skin cancer.  Safety  Always wear your seat belt while driving or riding in a vehicle.  Do not drive:  If you have been drinking alcohol. Do not ride with someone who has been drinking.  When you are tired or distracted.  While texting.  If you have been using any mind-altering substances or drugs.  Wear a helmet and other protective equipment during sports activities.  If you have firearms in your house, make sure you follow all gun safety procedures.  Seek help if you have been physically or sexually abused.  What's next?  Visit your health care provider once a year for an annual wellness visit.  Ask your health care provider how often you should have your eyes and teeth checked.  Stay up to date on all vaccines.  This information is not intended to replace advice given to you by your health care provider. Make sure you discuss any questions you have with your health care provider.  Document Revised: 07/24/2020 Document Reviewed: 07/24/2020  Elsevier Patient Education  2024 [employer].

## 2023-11-22 ENCOUNTER — Encounter: Payer: Self-pay | Admitting: Oncology

## 2023-11-24 ENCOUNTER — Encounter: Payer: Self-pay | Admitting: Oncology

## 2023-12-01 ENCOUNTER — Telehealth: Payer: Self-pay | Admitting: Nurse Practitioner

## 2023-12-01 ENCOUNTER — Encounter: Payer: Self-pay | Admitting: Nurse Practitioner

## 2023-12-01 ENCOUNTER — Ambulatory Visit: Payer: Self-pay | Admitting: Nurse Practitioner

## 2023-12-01 VITALS — BP 126/88 | HR 56 | Temp 98.3°F | Ht 59.75 in | Wt 211.6 lb

## 2023-12-01 DIAGNOSIS — J4520 Mild intermittent asthma, uncomplicated: Secondary | ICD-10-CM

## 2023-12-01 DIAGNOSIS — R400 Somnolence: Secondary | ICD-10-CM | POA: Diagnosis not present

## 2023-12-01 DIAGNOSIS — G478 Other sleep disorders: Secondary | ICD-10-CM | POA: Diagnosis not present

## 2023-12-01 DIAGNOSIS — Z114 Encounter for screening for human immunodeficiency virus [HIV]: Secondary | ICD-10-CM

## 2023-12-01 DIAGNOSIS — E7800 Pure hypercholesterolemia, unspecified: Secondary | ICD-10-CM

## 2023-12-01 DIAGNOSIS — E039 Hypothyroidism, unspecified: Secondary | ICD-10-CM

## 2023-12-01 DIAGNOSIS — R7303 Prediabetes: Secondary | ICD-10-CM

## 2023-12-01 DIAGNOSIS — Z0000 Encounter for general adult medical examination without abnormal findings: Secondary | ICD-10-CM | POA: Diagnosis not present

## 2023-12-01 DIAGNOSIS — R0683 Snoring: Secondary | ICD-10-CM

## 2023-12-01 DIAGNOSIS — E041 Nontoxic single thyroid nodule: Secondary | ICD-10-CM

## 2023-12-01 DIAGNOSIS — F419 Anxiety disorder, unspecified: Secondary | ICD-10-CM

## 2023-12-01 DIAGNOSIS — D509 Iron deficiency anemia, unspecified: Secondary | ICD-10-CM

## 2023-12-01 DIAGNOSIS — Z1159 Encounter for screening for other viral diseases: Secondary | ICD-10-CM

## 2023-12-01 LAB — LIPID PANEL
Cholesterol: 187 mg/dL (ref 0–200)
HDL: 40.4 mg/dL (ref >39.00)
LDL Cholesterol: 129 mg/dL — ABNORMAL HIGH (ref 0–99)
NonHDL: 146.43
Total CHOL/HDL Ratio: 5
Triglycerides: 87 mg/dL (ref 0.0–149.0)
VLDL: 17.4 mg/dL (ref 0.0–40.0)

## 2023-12-01 LAB — COMPREHENSIVE METABOLIC PANEL WITH GFR
ALT: 15 U/L (ref 0–35)
AST: 12 U/L (ref 0–37)
Albumin: 4.3 g/dL (ref 3.5–5.2)
Alkaline Phosphatase: 52 U/L (ref 39–117)
BUN: 13 mg/dL (ref 6–23)
CO2: 29 meq/L (ref 19–32)
Calcium: 9.2 mg/dL (ref 8.4–10.5)
Chloride: 105 meq/L (ref 96–112)
Creatinine, Ser: 0.86 mg/dL (ref 0.40–1.20)
GFR: 79.06 mL/min (ref >60.00)
Glucose, Bld: 101 mg/dL — ABNORMAL HIGH (ref 70–99)
Potassium: 4.7 meq/L (ref 3.5–5.1)
Sodium: 142 meq/L (ref 135–145)
Total Bilirubin: 0.4 mg/dL (ref 0.2–1.2)
Total Protein: 6.9 g/dL (ref 6.0–8.3)

## 2023-12-01 LAB — CBC WITH DIFFERENTIAL/PLATELET
Basophils Absolute: 0 K/uL (ref 0.0–0.1)
Basophils Relative: 0.7 % (ref 0.0–3.0)
Eosinophils Absolute: 0.2 K/uL (ref 0.0–0.7)
Eosinophils Relative: 5.5 % — ABNORMAL HIGH (ref 0.0–5.0)
HCT: 43.3 % (ref 36.0–46.0)
Hemoglobin: 14.1 g/dL (ref 12.0–15.0)
Lymphocytes Relative: 34.8 % (ref 12.0–46.0)
Lymphs Abs: 1.5 K/uL (ref 0.7–4.0)
MCHC: 32.7 g/dL (ref 30.0–36.0)
MCV: 86.3 fl (ref 78.0–100.0)
Monocytes Absolute: 0.3 K/uL (ref 0.1–1.0)
Monocytes Relative: 7.7 % (ref 3.0–12.0)
Neutro Abs: 2.2 K/uL (ref 1.4–7.7)
Neutrophils Relative %: 51.3 % (ref 43.0–77.0)
Platelets: 251 K/uL (ref 150.0–400.0)
RBC: 5.01 Mil/uL (ref 3.87–5.11)
RDW: 14.6 % (ref 11.5–15.5)
WBC: 4.2 K/uL (ref 4.0–10.5)

## 2023-12-01 LAB — TSH: TSH: 2.39 u[IU]/mL (ref 0.35–5.50)

## 2023-12-01 LAB — HEMOGLOBIN A1C: Hgb A1c MFr Bld: 6.2 % (ref 4.6–6.5)

## 2023-12-01 MED ORDER — HYDROXYZINE HCL 10 MG PO TABS: 10.0000 mg | ORAL_TABLET | Freq: Every evening | ORAL | 0 refills | Status: DC | PRN

## 2023-12-01 NOTE — Telephone Encounter (Unsigned)
 Copied from CRM [id number]. Topic: Clinical - Request for Lab/Test Order >> Dec 01, 2023  4:14 PM Viola F wrote: Reason for CRM: Patient returned Carla Wheeler's phone call, says she would prefer the thyroid  US  in [place] and [place] is okay early in the morning.

## 2023-12-01 NOTE — Assessment & Plan Note (Signed)
 History of the same pending lipid panel today

## 2023-12-01 NOTE — Progress Notes (Signed)
 Established Patient Office Visit  Subjective   Patient ID: Carla Wheeler, female    DOB: 03/01/1975  Age: 48 y.o. MRN: [id number]  Chief Complaint  Patient presents with   Annual Exam    HPI  Asthma: Currently maintained on albuterol  inhaler as needed. States that she has not used it in a couple of years   Hypothyroidism: Currently maintained on levothyroxine  125 mcg daily  for complete physical and follow up of chronic conditions.  Immunizations: -Tetanus: Completed in 2018 -Influenza: refused -Shingles: Too young -Pneumonia: discussed in office. Will get when she turns 48  Diet: Fair diet. She is eating 2 full meals a day. She is snacking all day. She drinks a lot of water. Coffee and sometimes a diet coke  Exercise: No regular exercise.  Eye exam: Completes annually. Glasses   Dental exam: Completes semi-annually    Colonoscopy: Completed in 03/20/2021, repeat 10 years.  Patient will be due in 2033 Lung Cancer Screening: NA   Pap smear: 02/12/2022, negative cells and negative HPV.  Patient was due in 2027. Followed by [facility] currently   Mammogram: 11/18/2022 done at [facility]. Missed her appt. They have reached out to her to reschedule  DEXA: Too young  Sleep:going to bed around 11 and she will get up around 6. States that she does not feel rested. States that she wishes she could turn her mind off. Statse taht she dose see a therapist. States that she wishes that her mind      Review of Systems  Constitutional:  Negative for chills and fever.  Respiratory:  Negative for shortness of breath.   Cardiovascular:  Negative for chest pain and leg swelling.  Gastrointestinal:  Negative for abdominal pain, blood in stool, constipation, diarrhea, nausea and vomiting.       BM daily   Genitourinary:  Negative for dysuria and hematuria.  Neurological:  Positive for tingling (in the am in her hands and feet). Negative for dizziness and headaches.   Psychiatric/Behavioral:  Negative for hallucinations and suicidal ideas.       Objective:     BP 126/88   Pulse (!) 56   Temp 98.3 F (36.8 C) (Oral)   Ht 4' 11.75 (1.518 m)   Wt 211 lb 9.6 oz (96 kg)   SpO2 96%   BMI 41.67 kg/m  BP Readings from Last 3 Encounters:  12/01/23 126/88  10/12/23 124/82  09/07/22 (!) 145/82   Wt Readings from Last 3 Encounters:  12/01/23 211 lb 9.6 oz (96 kg)  10/12/23 209 lb (94.8 kg)  07/24/22 207 lb (93.9 kg)   SpO2 Readings from Last 3 Encounters:  12/01/23 96%  10/12/23 96%  09/07/22 95%      Physical Exam Vitals and nursing note reviewed.  Constitutional:      Appearance: Normal appearance.  HENT:     Right Ear: Tympanic membrane, ear canal and external ear normal.     Left Ear: Tympanic membrane, ear canal and external ear normal.     Mouth/Throat:     Mouth: Mucous membranes are moist.     Pharynx: Oropharynx is clear.  Eyes:     Extraocular Movements: Extraocular movements intact.     Pupils: Pupils are equal, round, and reactive to light.  Cardiovascular:     Rate and Rhythm: Normal rate and regular rhythm.     Pulses: Normal pulses.     Heart sounds: Normal heart sounds.  Pulmonary:  Effort: Pulmonary effort is normal.     Breath sounds: Normal breath sounds.  Abdominal:     General: Bowel sounds are normal. There is no distension.     Palpations: There is no mass.     Tenderness: There is no abdominal tenderness.     Hernia: No hernia is present.  Musculoskeletal:     Right lower leg: No edema.     Left lower leg: No edema.  Lymphadenopathy:     Cervical: No cervical adenopathy.  Skin:    General: Skin is warm.  Neurological:     General: No focal deficit present.     Mental Status: She is alert.     Deep Tendon Reflexes:     Reflex Scores:      Bicep reflexes are 2+ on the right side and 2+ on the left side.      Patellar reflexes are 2+ on the right side and 2+ on the left side.    Comments:  Bilateral upper and lower extremity strength 5/5  Psychiatric:        Mood and Affect: Mood normal.        Behavior: Behavior normal.        Thought Content: Thought content normal.        Judgment: Judgment normal.      No results found for any visits on 12/01/23.    The 10-year ASCVD risk score (Arnett DK, et al., 2019) is: 2.2%    Assessment & Plan:   Problem List Items Addressed This Visit       Respiratory   Asthma     Endocrine   Hypothyroidism   History of the same.  Patient currently maintained on levothyroxine  125 mg daily.  Patient is overdue for ultrasound surveillance of thyroid  nodules..  Will reach out to patient and get this scheduled        Nervous and Auditory   Non-restorative sleep   1/3 nonrestorative sleep and difficulty getting to sleep.  Patient is also a snorer will do at home sleep study      Relevant Orders   Ambulatory referral to Sleep Studies     Other   Anemia, iron  deficiency   History of the same pending CBC      Anxiety   Currently seeing a therapist every 2 weeks.  We will try hydroxyzine 10 mg nightly as needed.  Patient denies HI/SI/AVH.      Relevant Medications   hydrOXYzine (ATARAX) 10 MG tablet   Preventative health care - Primary   Discussed age-appropriate immunizations and screening exams.  Did review patient's personal, surgical, social, family histories.  Patient is up-to-date with all age-appropriate vaccinations she would like.  She will wait till she turns 48 in 1 month to get the Prevnar 20 vaccine.  Patient is up-to-date on CRC screening, cervical cancer screening.  Patient is overdue for mammogram and missed the appointment.  States the imaging center has reached out to her to reschedule.  Encouraged her to do so.  Patient was given information at discharge about preventative healthcare maintenance with anticipatory guidance.      Relevant Orders   Comprehensive metabolic panel with GFR   CBC with  Differential/Platelet   TSH   Morbid obesity (HCC)   Pending TSH, A1c, lipid panel.  Continue working on lifestyle modifications      Relevant Orders   Ambulatory referral to Sleep Studies   Pure hypercholesterolemia   History of the same  pending lipid panel today      Relevant Orders   Lipid panel   Prediabetes   History of the same.  Pending A1c today      Relevant Orders   Hemoglobin A1c   Daytime sleepiness   3/3 home sleep study      Relevant Orders   Ambulatory referral to Sleep Studies   Snoring   2/3 at home sleep study      Relevant Orders   Ambulatory referral to Sleep Studies   Other Visit Diagnoses       Encounter for hepatitis C screening test for low risk patient       Relevant Orders   Hepatitis C antibody     Encounter for screening for HIV       Relevant Orders   HIV antibody (with reflex)       Return in about 1 year (around 11/30/2024) for CPE and Labs.    Adina Crandall, NP

## 2023-12-01 NOTE — Assessment & Plan Note (Signed)
 History of the same pending CBC

## 2023-12-01 NOTE — Assessment & Plan Note (Signed)
 3/3 home sleep study

## 2023-12-01 NOTE — Patient Instructions (Signed)
 Nice to see you today  I will be in touch with the labs once I have them  Follow up with me in 1 year, sooner if you need me  Try the hydroxyzine at bedtime  I have ordered the at home sleep test  Call and schedule your mammogram with [facility]   Get the pneumonia vaccine once you turn 48

## 2023-12-01 NOTE — Assessment & Plan Note (Signed)
 Currently seeing a therapist every 2 weeks.  We will try hydroxyzine 10 mg nightly as needed.  Patient denies HI/SI/AVH.

## 2023-12-01 NOTE — Assessment & Plan Note (Signed)
History of the same.  Pending A1c today 

## 2023-12-01 NOTE — Telephone Encounter (Signed)
 I forgot to discuss that she needs a repeat thyroid  US . We can do those in  or [place]  Which ever she chooses give her the contact information and then I will place the order.

## 2023-12-01 NOTE — Assessment & Plan Note (Addendum)
 1/3 nonrestorative sleep and difficulty getting to sleep.  Patient is also a snorer will do at home sleep study

## 2023-12-01 NOTE — Assessment & Plan Note (Signed)
Pending TSH, A1c, lipid panel.  Continue working on lifestyle modifications.

## 2023-12-01 NOTE — Assessment & Plan Note (Signed)
 2/3 at home sleep study

## 2023-12-01 NOTE — Assessment & Plan Note (Signed)
 Discussed age-appropriate immunizations and screening exams.  Did review patient's personal, surgical, social, family histories.  Patient is up-to-date with all age-appropriate vaccinations she would like.  She will wait till she turns 48 in 1 month to get the Prevnar 20 vaccine.  Patient is up-to-date on CRC screening, cervical cancer screening.  Patient is overdue for mammogram and missed the appointment.  States the imaging center has reached out to her to reschedule.  Encouraged her to do so.  Patient was given information at discharge about preventative healthcare maintenance with anticipatory guidance.

## 2023-12-01 NOTE — Assessment & Plan Note (Signed)
 History of the same.  Patient currently maintained on levothyroxine  125 mg daily.  Patient is overdue for ultrasound surveillance of thyroid  nodules..  Will reach out to patient and get this scheduled

## 2023-12-01 NOTE — Telephone Encounter (Signed)
 Left detailed voicemail for patient to call the office back.

## 2023-12-02 LAB — HIV ANTIBODY (ROUTINE TESTING W REFLEX)
HIV 1&2 Ab, 4th Generation: NONREACTIVE
HIV FINAL INTERPRETATION: NEGATIVE

## 2023-12-02 LAB — HEPATITIS C ANTIBODY: Hepatitis C Ab: NONREACTIVE

## 2023-12-02 NOTE — Addendum Note (Signed)
 Addended by: WENDEE LYNWOOD HERO on: 12/02/2023 10:35 AM   Modules accepted: Orders

## 2023-12-02 NOTE — Telephone Encounter (Signed)
 Order has been placed   Sent contact information via my chart

## 2023-12-03 ENCOUNTER — Ambulatory Visit: Payer: Self-pay | Admitting: Nurse Practitioner

## 2023-12-08 ENCOUNTER — Ambulatory Visit
Admission: RE | Admit: 2023-12-08 | Discharge: 2023-12-08 | Disposition: A | Source: Ambulatory Visit | Attending: Nurse Practitioner

## 2023-12-08 DIAGNOSIS — E041 Nontoxic single thyroid nodule: Secondary | ICD-10-CM

## 2023-12-10 ENCOUNTER — Ambulatory Visit: Payer: Self-pay | Admitting: Nurse Practitioner

## 2023-12-17 ENCOUNTER — Telehealth: Payer: Self-pay | Admitting: Nurse Practitioner

## 2023-12-17 DIAGNOSIS — F419 Anxiety disorder, unspecified: Secondary | ICD-10-CM

## 2023-12-17 NOTE — Telephone Encounter (Signed)
 Can we see if she has tried the hydroxyzine and if it was helpful

## 2023-12-17 NOTE — Telephone Encounter (Signed)
-----   Message from [facility] sent at 12/01/2023  9:14 AM EDT ----- Regarding: sleep/anxiety Can we see if she has tried the hydroxyzine and if it was helpful

## 2023-12-20 MED ORDER — HYDROXYZINE HCL 10 MG PO TABS: 10.0000 mg | ORAL_TABLET | Freq: Every evening | ORAL | 2 refills | Status: AC | PRN

## 2023-12-20 NOTE — Telephone Encounter (Signed)
 Glad to hear. I have sent in refills to the pharmacy for her to use as she needs

## 2023-12-20 NOTE — Addendum Note (Signed)
 Addended by: WENDEE LYNWOOD HERO on: 12/20/2023 02:40 PM   Modules accepted: Orders

## 2023-12-20 NOTE — Telephone Encounter (Signed)
 Pt complains of hydroxyzine being helpful. States she is getting peaceful sleep and is waking up less anxious and tired.

## 2024-01-18 ENCOUNTER — Ambulatory Visit: Payer: Self-pay

## 2024-01-18 NOTE — Telephone Encounter (Signed)
 FYI Only or Action Required?: FYI only for provider: appointment scheduled on 01/20/24 based on availability.  Patient was last seen in primary care on 12/01/2023 by Wendee Lynwood HERO, NP.  Called Nurse Triage reporting Cough.  Symptoms began several days ago.  Interventions attempted: OTC medications: tylenol , ibuprofen , vicks vapor rub and Rest, hydration, or home remedies.  Symptoms are: gradually worsening.  Triage Disposition: See Physician Within 24 Hours  Patient/caregiver understands and will follow disposition?:    Copied from CRM #[id number]. Topic: Clinical - Red Word Triage >> Jan 18, 2024  4:08 PM Carla Wheeler wrote: Red Word that prompted transfer to Nurse Triage: having cold symptoms, coughing up brownish mucus, throat is swollen.   Reason for Disposition  Coughing up rusty-colored (reddish-brown) sputum  Answer Assessment - Initial Assessment Questions Pt called in with worsening cold-like symptoms. Pt has been using vicks vapor rub, tylenol /ibuprofen  and drinking warm fluids without relief of symptoms. Pt denies SOB or chest pain. States she just feels that she should be improving. Pt reports brownish mucous when she is able to clear congestion. Denies any visible blood. Based on appt availability, scheduled 12/11 but provided pt education to check temperature and monitor for a fever as well as any blood or blood-tinged mucous. Pt voiced understanding. Appointment scheduled for evaluation. Patient agrees with plan of care, and will call back if anything changes, or if symptoms worsen.     1. ONSET: When did the cough begin?      Saturday, 12/06  2. SEVERITY: How bad is the cough today?      Mod-severe; feels like constantly trying to clear mucous, causing throat to be sore   3. SPUTUM: Describe the color of your sputum (e.g., none, dry cough; clear, white, yellow, green)     Brownish mucous   4. HEMOPTYSIS: Are you coughing up any blood? If Yes, ask: How much?  (e.g., flecks, streaks, tablespoons, etc.)     None   5. DIFFICULTY BREATHING: Are you having difficulty breathing? If Yes, ask: How bad is it? (e.g., mild, moderate, severe)      Only when coughing   6. FEVER: Do you have a fever? If Yes, ask: What is your temperature, how was it measured, and when did it start?     Unsure; pt reports she has not checked her temperature but does report chills and body aches   10. OTHER SYMPTOMS: Do you have any other symptoms? (e.g., runny nose, wheezing, chest pain)       Denies chest pain; cold symptoms and congestion  Protocols used: Cough - Acute Productive-A-AH

## 2024-01-19 ENCOUNTER — Encounter: Payer: Self-pay | Admitting: Nurse Practitioner

## 2024-01-19 NOTE — Telephone Encounter (Signed)
 Noted

## 2024-01-20 ENCOUNTER — Ambulatory Visit: Admitting: Family Medicine

## 2024-01-20 ENCOUNTER — Encounter: Payer: Self-pay | Admitting: Family Medicine

## 2024-01-20 VITALS — BP 132/80 | HR 68 | Temp 99.6°F | Ht 59.5 in | Wt 219.4 lb

## 2024-01-20 DIAGNOSIS — R509 Fever, unspecified: Secondary | ICD-10-CM | POA: Diagnosis not present

## 2024-01-20 DIAGNOSIS — R051 Acute cough: Secondary | ICD-10-CM

## 2024-01-20 DIAGNOSIS — J029 Acute pharyngitis, unspecified: Secondary | ICD-10-CM | POA: Diagnosis not present

## 2024-01-20 DIAGNOSIS — Z20822 Contact with and (suspected) exposure to covid-19: Secondary | ICD-10-CM | POA: Diagnosis not present

## 2024-01-20 LAB — POC COVID19 BINAXNOW: SARS Coronavirus 2 Ag: NEGATIVE

## 2024-01-20 LAB — POCT INFLUENZA A/B
Influenza A, POC: NEGATIVE
Influenza B, POC: NEGATIVE

## 2024-01-20 LAB — POCT RAPID STREP A (OFFICE): Rapid Strep A Screen: NEGATIVE

## 2024-01-20 MED ORDER — AZITHROMYCIN 250 MG PO TABS: ORAL_TABLET | ORAL | 0 refills | Status: AC

## 2024-01-20 NOTE — Progress Notes (Signed)
 Patient ID: Carla Wheeler, female    DOB: 12-05-1975, 48 y.o.   MRN: [id number]  This visit was conducted in person.  BP 132/80 (BP Location: Left Arm, Patient Position: Sitting, Cuff Size: Large)   Pulse 68   Temp 99.6 F (37.6 C) (Temporal)   Ht 4' 11.5 (1.511 m)   Wt 219 lb 6.4 oz (99.5 kg)   SpO2 99%   BMI 43.57 kg/m    CC:  Chief Complaint  Patient presents with   Acute Visit    Reports cough with production of brown mucus, runny nose, sore throat, ear pain, sinus pressure and pain, body aches.    Subjective:   HPI: Carla Wheeler is a 48 y.o. female presenting on 01/20/2024 for Acute Visit (Reports cough with production of brown mucus, runny nose, sore throat, ear pain, sinus pressure and pain, body aches.)   Date of onset: 6 days Initial symptoms included  scratchy throat Symptoms progressed to body aches, productive cough  Hoarse voice.   Feeling chills and cold sweats... she did take ibuprofen  before she came.   No ear pain, no face pain.  Neck pressure.  No wheeze, no SOB   Sick contacts:  none.. works in school COVID testing:   none     She has tried to treat with  Dayquil and Nyquil, ibuprofen  and tylenol  alternating.     No history of chronic lung disease such as asthma or COPD. Non-smoker.       Relevant past medical, surgical, family and social history reviewed and updated as indicated. Interim medical history since our last visit reviewed. Allergies and medications reviewed and updated. Outpatient Medications Prior to Visit  Medication Sig Dispense Refill   Ferrous Sulfate (IRON  PO) Take by mouth.     hydrOXYzine  (ATARAX ) 10 MG tablet Take 1 tablet (10 mg total) by mouth at bedtime as needed. 30 tablet 2   levothyroxine  (SYNTHROID ) 125 MCG tablet TAKE 1 TABLET BY MOUTH DAILY BEFORE BREAKFAST. 90 tablet 0   Relugolix-Estradiol-Norethind (MYFEMBREE ) 40-1-0.5 MG TABS Take 1 tablet by mouth daily. 28 tablet 11   No facility-administered  medications prior to visit.     Per HPI unless specifically indicated in ROS section below Review of Systems  Constitutional:  Positive for fatigue and fever.  HENT:  Positive for congestion.   Eyes:  Negative for pain.  Respiratory:  Positive for cough. Negative for shortness of breath.   Cardiovascular:  Negative for chest pain, palpitations and leg swelling.  Gastrointestinal:  Negative for abdominal pain.  Genitourinary:  Negative for dysuria and vaginal bleeding.  Musculoskeletal:  Negative for back pain.  Neurological:  Negative for syncope, light-headedness and headaches.  Psychiatric/Behavioral:  Negative for dysphoric mood.    Objective:  BP 132/80 (BP Location: Left Arm, Patient Position: Sitting, Cuff Size: Large)   Pulse 68   Temp 99.6 F (37.6 C) (Temporal)   Ht 4' 11.5 (1.511 m)   Wt 219 lb 6.4 oz (99.5 kg)   SpO2 99%   BMI 43.57 kg/m   Wt Readings from Last 3 Encounters:  01/20/24 219 lb 6.4 oz (99.5 kg)  12/01/23 211 lb 9.6 oz (96 kg)  10/12/23 209 lb (94.8 kg)      Physical Exam Constitutional:      General: She is not in acute distress.    Appearance: Normal appearance. She is well-developed. She is ill-appearing. She is not toxic-appearing.  HENT:     Head: Normocephalic.  Right Ear: Hearing, tympanic membrane, ear canal and external ear normal. There is impacted cerumen. Tympanic membrane is not erythematous, retracted or bulging.     Left Ear: Hearing, tympanic membrane, ear canal and external ear normal. There is impacted cerumen. Tympanic membrane is not erythematous, retracted or bulging.     Nose: No mucosal edema or rhinorrhea.     Right Sinus: No maxillary sinus tenderness or frontal sinus tenderness.     Left Sinus: No maxillary sinus tenderness or frontal sinus tenderness.     Mouth/Throat:     Pharynx: Uvula midline.  Eyes:     General: Lids are normal. Lids are everted, no foreign bodies appreciated.     Conjunctiva/sclera:  Conjunctivae normal.     Pupils: Pupils are equal, round, and reactive to light.  Neck:     Thyroid : No thyroid  mass or thyromegaly.     Vascular: No carotid bruit.     Trachea: Trachea normal.  Cardiovascular:     Rate and Rhythm: Normal rate and regular rhythm.     Pulses: Normal pulses.     Heart sounds: Normal heart sounds, S1 normal and S2 normal. No murmur heard.    No friction rub. No gallop.  Pulmonary:     Effort: Pulmonary effort is normal. No tachypnea or respiratory distress.     Breath sounds: Normal breath sounds. No decreased breath sounds, wheezing, rhonchi or rales.  Abdominal:     General: Bowel sounds are normal.     Palpations: Abdomen is soft.     Tenderness: There is no abdominal tenderness.  Musculoskeletal:     Cervical back: Normal range of motion and neck supple.  Skin:    General: Skin is warm and dry.     Findings: No rash.  Neurological:     Mental Status: She is alert.  Psychiatric:        Mood and Affect: Mood is not anxious or depressed.        Speech: Speech normal.        Behavior: Behavior normal. Behavior is cooperative.        Thought Content: Thought content normal.        Judgment: Judgment normal.       Results for orders placed or performed in visit on 01/20/24  Rapid Strep A   Collection Time: 01/20/24 11:20 AM  Result Value Ref Range   Rapid Strep A Screen Negative Negative  POC COVID-19   Collection Time: 01/20/24 11:21 AM  Result Value Ref Range   SARS Coronavirus 2 Ag Negative Negative  Influenza A/B   Collection Time: 01/20/24 11:21 AM  Result Value Ref Range   Influenza A, POC Negative Negative   Influenza B, POC Negative Negative    Assessment and Plan  Suspected COVID-19 virus infection -     POC COVID-19 BinaxNow  Fever, unspecified fever cause -     POCT Influenza A/B  Sore throat -     POCT rapid strep A  Acute cough Assessment & Plan: Acute, most likely initial viral infection now with concern for  bacterial superinfection given new fever late in illness.  Negative strep, COVID and flu testing.  Lung exam clear and no indication for imaging. Will treat with azithromycin  5-day course.  Recommended rest fluids and symptomatic care.  Return and ER precautions provided.  Patient will go to ER if severe shortness of breath.   Other orders -     Azithromycin ; Take 2 tablets  on day 1, then 1 tablet daily on days 2 through 5  Dispense: 6 tablet; Refill: 0    No follow-ups on file.   Greig Ring, MD

## 2024-01-20 NOTE — Assessment & Plan Note (Addendum)
 Acute, most likely initial viral infection now with concern for bacterial superinfection given new fever late in illness.  Negative strep, COVID and flu testing.  Lung exam clear and no indication for imaging. Will treat with azithromycin  5-day course.  Recommended rest fluids and symptomatic care.  Return and ER precautions provided.  Patient will go to ER if severe shortness of breath.

## 2024-02-19 ENCOUNTER — Telehealth: Admitting: Nurse Practitioner

## 2024-02-19 DIAGNOSIS — J208 Acute bronchitis due to other specified organisms: Secondary | ICD-10-CM

## 2024-02-19 MED ORDER — ALBUTEROL SULFATE (2.5 MG/3ML) 0.083% IN NEBU: 2.5000 mg | INHALATION_SOLUTION | Freq: Four times a day (QID) | RESPIRATORY_TRACT | 1 refills | Status: DC | PRN

## 2024-02-19 MED ORDER — PREDNISONE 20 MG PO TABS: 20.0000 mg | ORAL_TABLET | Freq: Every day | ORAL | 0 refills | Status: DC

## 2024-02-19 MED ORDER — ALBUTEROL SULFATE HFA 108 (90 BASE) MCG/ACT IN AERS: 2.0000 | INHALATION_SPRAY | Freq: Four times a day (QID) | RESPIRATORY_TRACT | 0 refills | Status: AC | PRN

## 2024-02-19 NOTE — Progress Notes (Signed)
 " Virtual Visit Consent   Carla Wheeler, you are scheduled for a virtual visit with a [facility] provider today. Just as with appointments in the office, your consent must be obtained to participate. Your consent will be active for this visit and any virtual visit you may have with one of our providers in the next 365 days. If you have a MyChart account, a copy of this consent can be sent to you electronically.  As this is a virtual visit, video technology does not allow for your provider to perform a traditional examination. This may limit your provider's ability to fully assess your condition. If your provider identifies any concerns that need to be evaluated in person or the need to arrange testing (such as labs, EKG, etc.), we will make arrangements to do so. Although advances in technology are sophisticated, we cannot ensure that it will always work on either your end or our end. If the connection with a video visit is poor, the visit may have to be switched to a telephone visit. With either a video or telephone visit, we are not always able to ensure that we have a secure connection.  By engaging in this virtual visit, you consent to the provision of healthcare and authorize for your insurance to be billed (if applicable) for the services provided during this visit. Depending on your insurance coverage, you may receive a charge related to this service.  I need to obtain your verbal consent now. Are you willing to proceed with your visit today? Carla Wheeler has provided verbal consent on 02/19/2024 for a virtual visit (video or telephone). Carla LELON Servant, NP  Date: 02/19/2024 2:27 PM   Virtual Visit via Video Note   I, Carla Wheeler, connected with  Carla Wheeler  ([id number], August 15, 1975) on 02/19/2024 at  2:15 PM EST by a video-enabled telemedicine application and verified that I am speaking with the correct person using two identifiers.  Location: Patient: Virtual Visit Location  Patient: Home Provider: Virtual Visit Location Provider: Home Office   I discussed the limitations of evaluation and management by telemedicine and the availability of in person appointments. The patient expressed understanding and agreed to proceed.    History of Present Illness: Carla Wheeler is a 49 y.o. who identifies as a female who was assigned female at birth, and is being seen today for viral bronchitis.   Over the past several days Carla Wheeler has been experiencing flulike symptoms including nasal CONGESTION, headache, fever, body aches, cough and chest congestion.  She went to the urgent care on January 8 and tested negative for COVID, FLU, strep.  Today she states her chest congestion is worsening along with shortness of breath and wheezing.  She does have a history of asthma.  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   Daytime sleepiness 12/01/2023   Snoring 12/01/2023   Pure hypercholesterolemia 02/16/2022   Prediabetes 02/16/2022   Morbid obesity (HCC) 02/12/2022   Thyroid  nodule 02/12/2022   Asthma 02/12/2022   Viral respiratory infection 02/04/2022   Generalized body aches 02/04/2022   Flu-like symptoms 02/04/2022   Upper respiratory tract infection 11/06/2021   Other headache syndrome 09/10/2021   Fever and chills 09/10/2021   Menorrhagia with irregular cycle 07/09/2021   Abnormal menstruation 07/09/2021   Preventative health care    Sore throat 12/16/2020   Acute cough 12/16/2020   Body aches 12/16/2020   Obesity (  BMI 35.0-39.9 without comorbidity) 11/04/2020   Paresthesia 11/04/2020   Anemia, iron  deficiency 07/19/2015   Hypothyroidism 07/21/2013   Anxiety 07/15/2011    Allergies: Allergies[1] Medications: Current Medications[2]  Observations/Objective: Patient is well-developed, well-nourished in no acute distress.  Resting comfortably at home.  Head is normocephalic, atraumatic.  No labored  breathing. Speech is clear and coherent with logical content.  Patient is alert and oriented at baseline.    Assessment and Plan: 1. Acute viral bronchitis (Primary) - predniSONE  (DELTASONE ) 20 MG tablet; Take 1 tablet (20 mg total) by mouth daily with breakfast for 5 days.  Dispense: 5 tablet; Refill: 0 - albuterol  (PROVENTIL ) (2.5 MG/3ML) 0.083% nebulizer solution; Take 3 mLs (2.5 mg total) by nebulization every 6 (six) hours as needed for wheezing or shortness of breath.  Dispense: 150 mL; Refill: 1 - albuterol  (VENTOLIN  HFA) 108 (90 Base) MCG/ACT inhaler; Inhale 2 puffs into the lungs every 6 (six) hours as needed for wheezing or shortness of breath.  Dispense: 8 g; Refill: 0  Try TheraFlu and NeilMed sinus rinse  Follow Up Instructions: I discussed the assessment and treatment plan with the patient. The patient was provided an opportunity to ask questions and all were answered. The patient agreed with the plan and demonstrated an understanding of the instructions.  A copy of instructions were sent to the patient via MyChart unless otherwise noted below.    The patient was advised to call back or seek an in-person evaluation if the symptoms worsen or if the condition fails to improve as anticipated.    Carla Mckesson W Jaren Vanetten, NP     [1]  Allergies Allergen Reactions   Augmentin [Amoxicillin-Pot Clavulanate] Other (See Comments)    Muscle cramps  [2]  Current Outpatient Medications:    albuterol  (PROVENTIL ) (2.5 MG/3ML) 0.083% nebulizer solution, Take 3 mLs (2.5 mg total) by nebulization every 6 (six) hours as needed for wheezing or shortness of breath., Disp: 150 mL, Rfl: 1   albuterol  (VENTOLIN  HFA) 108 (90 Base) MCG/ACT inhaler, Inhale 2 puffs into the lungs every 6 (six) hours as needed for wheezing or shortness of breath., Disp: 8 g, Rfl: 0   predniSONE  (DELTASONE ) 20 MG tablet, Take 1 tablet (20 mg total) by mouth daily with breakfast for 5 days., Disp: 5 tablet, Rfl: 0   Ferrous  Sulfate (IRON  PO), Take by mouth., Disp: , Rfl:    hydrOXYzine  (ATARAX ) 10 MG tablet, Take 1 tablet (10 mg total) by mouth at bedtime as needed., Disp: 30 tablet, Rfl: 2   levothyroxine  (SYNTHROID ) 125 MCG tablet, TAKE 1 TABLET BY MOUTH DAILY BEFORE BREAKFAST., Disp: 90 tablet, Rfl: 0   Relugolix-Estradiol-Norethind (MYFEMBREE ) 40-1-0.5 MG TABS, Take 1 tablet by mouth daily., Disp: 28 tablet, Rfl: 11  "

## 2024-02-19 NOTE — Patient Instructions (Signed)
 " Lucienne LITTIE Lesches, thank you for joining Haze LELON Servant, NP for today's virtual visit.  While this provider is not your primary care provider (PCP), if your PCP is located in our provider database this encounter information will be shared with them immediately following your visit.   A [facility] MyChart account gives you access to today's visit and all your visits, tests, and labs performed at [facility]  click here if you don't have a [facility] MyChart account or go to [email or web address]  Consent: (Patient) Lucienne LITTIE Lesches provided verbal consent for this virtual visit at the beginning of the encounter.  Current Medications:  Current Outpatient Medications:    albuterol  (PROVENTIL ) (2.5 MG/3ML) 0.083% nebulizer solution, Take 3 mLs (2.5 mg total) by nebulization every 6 (six) hours as needed for wheezing or shortness of breath., Disp: 150 mL, Rfl: 1   albuterol  (VENTOLIN  HFA) 108 (90 Base) MCG/ACT inhaler, Inhale 2 puffs into the lungs every 6 (six) hours as needed for wheezing or shortness of breath., Disp: 8 g, Rfl: 0   predniSONE  (DELTASONE ) 20 MG tablet, Take 1 tablet (20 mg total) by mouth daily with breakfast for 5 days., Disp: 5 tablet, Rfl: 0   Ferrous Sulfate (IRON  PO), Take by mouth., Disp: , Rfl:    hydrOXYzine  (ATARAX ) 10 MG tablet, Take 1 tablet (10 mg total) by mouth at bedtime as needed., Disp: 30 tablet, Rfl: 2   levothyroxine  (SYNTHROID ) 125 MCG tablet, TAKE 1 TABLET BY MOUTH DAILY BEFORE BREAKFAST., Disp: 90 tablet, Rfl: 0   Relugolix-Estradiol-Norethind (MYFEMBREE ) 40-1-0.5 MG TABS, Take 1 tablet by mouth daily., Disp: 28 tablet, Rfl: 11   Medications ordered in this encounter:  Meds ordered this encounter  Medications   predniSONE  (DELTASONE ) 20 MG tablet    Sig: Take 1 tablet (20 mg total) by mouth daily with breakfast for 5 days.    Dispense:  5 tablet    Refill:  0    Supervising Provider:   BLAISE ALEENE KIDD [[id number]]   albuterol   (PROVENTIL ) (2.5 MG/3ML) 0.083% nebulizer solution    Sig: Take 3 mLs (2.5 mg total) by nebulization every 6 (six) hours as needed for wheezing or shortness of breath.    Dispense:  150 mL    Refill:  1    Supervising Provider:   BLAISE ALEENE KIDD [[id number]]   albuterol  (VENTOLIN  HFA) 108 (90 Base) MCG/ACT inhaler    Sig: Inhale 2 puffs into the lungs every 6 (six) hours as needed for wheezing or shortness of breath.    Dispense:  8 g    Refill:  0    Supervising Provider:   BLAISE ALEENE KIDD [[id number]]     *If you need refills on other medications prior to your next appointment, please contact your pharmacy*  Follow-Up: Call back or seek an in-person evaluation if the symptoms worsen or if the condition fails to improve as anticipated.  [facility] [phone]  Other Instructions Try TheraFlu and NeilMed sinus rinse   If you have been instructed to have an in-person evaluation today at a local Urgent Care facility, please use the link below. It will take you to a list of all of our available [facility], including address, phone number and hours of operation. Please do not delay care.   Urgent Cares  If you or a family member do not have a primary care provider, use the link below to schedule a visit and establish  care. When you choose a [facility] primary care physician or advanced practice provider, you gain a long-term partner in health. Find a Primary Care Provider  Learn more about [facility]'s in-office and virtual care options: [facility] - Get Care Now  "

## 2024-02-21 ENCOUNTER — Ambulatory Visit: Payer: Self-pay

## 2024-02-21 ENCOUNTER — Encounter: Payer: Self-pay | Admitting: Family Medicine

## 2024-02-21 ENCOUNTER — Ambulatory Visit: Payer: Self-pay | Admitting: Family Medicine

## 2024-02-21 ENCOUNTER — Ambulatory Visit
Admission: RE | Admit: 2024-02-21 | Discharge: 2024-02-21 | Disposition: A | Source: Ambulatory Visit | Attending: Family Medicine | Admitting: Family Medicine

## 2024-02-21 ENCOUNTER — Ambulatory Visit: Admitting: Family Medicine

## 2024-02-21 VITALS — BP 126/88 | HR 63 | Temp 98.6°F | Ht 59.5 in | Wt 216.1 lb

## 2024-02-21 DIAGNOSIS — J208 Acute bronchitis due to other specified organisms: Secondary | ICD-10-CM

## 2024-02-21 DIAGNOSIS — J4521 Mild intermittent asthma with (acute) exacerbation: Secondary | ICD-10-CM

## 2024-02-21 MED ORDER — DOXYCYCLINE HYCLATE 100 MG PO TABS: 100.0000 mg | ORAL_TABLET | Freq: Two times a day (BID) | ORAL | 0 refills | Status: AC

## 2024-02-21 MED ORDER — ALBUTEROL SULFATE (2.5 MG/3ML) 0.083% IN NEBU: 2.5000 mg | INHALATION_SOLUTION | Freq: Four times a day (QID) | RESPIRATORY_TRACT | 0 refills | Status: AC | PRN

## 2024-02-21 MED ORDER — PREDNISONE 20 MG PO TABS: ORAL_TABLET | ORAL | 0 refills | Status: AC

## 2024-02-21 NOTE — Telephone Encounter (Signed)
 FYI Only or Action Required?: FYI only for provider: appointment scheduled on 02/21/24.  Patient was last seen in primary care on 02/19/2024 by Theotis Haze ORN, NP.  Called Nurse Triage reporting Cough and Wheezing.  Symptoms began a week ago.  Interventions attempted: Prescription medications: prednisone , albuterol  inhaler, tessalon  and phenergan.  Symptoms are: gradually worsening.  Triage Disposition: See HCP Within 4 Hours (Or PCP Triage)  Patient/caregiver understands and will follow disposition?: Yes   Cough with wheezing x1 week. Seen in office on 1/8 for URI. Negative for flu. Prescribed tessalon  and phenergan. No improvement. Virtual visit on 1/10 for wheezing. Prescribed prednisone  and albuterol . Symptoms persist with dry cough and wheezing. No SOB, just wheezing. Speaking in clear full continuous sentences over the phone. Intermittent mild wheezing noted. Pressure in central chest like she needs to cough something up. Chest soreness from coughing. No cardiac hx. Hx of asthma.   Scheduled appt with different provider at home office d/t no PCP availability within timeframe. Advised UC or ED for worsening symptoms.    Copied from CRM [id number]. Topic: Clinical - Red Word Triage >> Feb 21, 2024 10:30 AM Joesph NOVAK wrote: Red Word that prompted transfer to Nurse Triage: Patient would like to get retested for the flu-went to urgent care Thursday and tested negative, wheezing-productive cough- shortness of breath Reason for Disposition  Wheezing is present  Answer Assessment - Initial Assessment Questions 1. ONSET: When did the cough begin?      1 week ago  2. SEVERITY: How bad is the cough today?      Moderate to severe  3. SPUTUM: Describe the color of your sputum (e.g., none, dry cough; clear, white, yellow, green)     Dry  4. HEMOPTYSIS: Are you coughing up any blood? If Yes, ask: How much? (e.g., flecks, streaks, tablespoons, etc.)     Denies  5. DIFFICULTY  BREATHING: Are you having difficulty breathing? If Yes, ask: How bad is it? (e.g., mild, moderate, severe)      Denies  6. FEVER: Do you have a fever? If Yes, ask: What is your temperature, how was it measured, and when did it start?     Yes. 103 F at the highest. 100.5 F yesterday, oral.  7. CARDIAC HISTORY: Do you have any history of heart disease? (e.g., heart attack, congestive heart failure)      Denies  8. LUNG HISTORY: Do you have any history of lung disease?  (e.g., pulmonary embolus, asthma, emphysema)     Asthma  9. PE RISK FACTORS: Do you have a history of blood clots? (or: recent major surgery, recent prolonged travel, bedridden)     Denies  10. OTHER SYMPTOMS: Do you have any other symptoms? (e.g., runny nose, wheezing, chest pain)       Wheezing, central chest pressure and soreness from coughing, fatigued.  Protocols used: Cough - Acute Non-Productive-A-AH

## 2024-02-21 NOTE — Assessment & Plan Note (Signed)
 Acute exacerbation with this uri/bronchitis   Prednisone  bumped to 60 mg taper  Albuterol 

## 2024-02-21 NOTE — Patient Instructions (Addendum)
 Take the 60 mg prednisone  taper as directed  Start it today   Chest xray now  We will reach out with result and plan (which antibiotic)   I recommend plain mucinex  over the counter  It is to help loosen phlegm in chest and head -take as directed with lots of fluid  If symptoms become more severe - go to the ER

## 2024-02-21 NOTE — Progress Notes (Signed)
 "  Subjective:    Patient ID: Carla Wheeler, female    DOB: 1975-09-16, 49 y.o.   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  presents for c/o cough and wheezing  Pmx notable for asthma   Has UC /virtual visits for these symptoms   Seen 1/8-uri/neg for flu -tessalon  and prometh DM Than virtual 1/10 for wheezing- prednisone  and albuterol   Still not feeling better  She hears rattling in her chest  Still wheezing  Hoarse voice  In pain from all the coughing   No nasal symptoms  ST from cough  Ears-some pressure   Temp is up and down 103 the highest on Saturday  100.5 last night     Cough-non productive- phlegm does not make it all the way up    Over the counter  Ibuprofen   Was taking day quil and nyquil before prescription medicines  No expectorant  No nasal sprays   Albuterol  mdi-has used it twice -usually at night  Does not have neb machine      Virtual visit   Assessment and Plan: 1. Acute viral bronchitis (Primary) - predniSONE  (DELTASONE ) 20 MG tablet; Take 1 tablet (20 mg total) by mouth daily with breakfast for 5 days.  Dispense: 5 tablet; Refill: 0 - albuterol  (PROVENTIL ) (2.5 MG/3ML) 0.083% nebulizer solution; Take 3 mLs (2.5 mg total) by nebulization every 6 (six) hours as needed for wheezing or shortness of breath.  Dispense: 150 mL; Refill: 1 - albuterol  (VENTOLIN  HFA) 108 (90 Base) MCG/ACT inhaler; Inhale 2 puffs int  Has had tessalon      Cxr today DG Chest 2 View Result Date: 02/21/2024 EXAM: 2 VIEW(S) XRAY OF THE CHEST 02/21/2024 01:10:45 PM COMPARISON: 02/18/2020. CLINICAL HISTORY: cough for over a week with wheezing that is not improving and fever FINDINGS: LUNGS AND PLEURA: Linear opacity in right  mid lung compatible with atelectasis or scarring. No pleural effusion. No pneumothorax. HEART AND MEDIASTINUM: No acute abnormality of the cardiac and mediastinal silhouettes. BONES AND SOFT TISSUES: No acute osseous abnormality. IMPRESSION: 1. Linear opacity in the right mid lung compatible with atelectasis or scarring. Electronically signed by: Waddell Calk MD MD 02/21/2024 02:58 PM EST RP Workstation: [id number]    Patient Active Problem List   Diagnosis Date Noted   Acute viral bronchitis 02/21/2024   Non-restorative sleep 12/01/2023   Daytime sleepiness 12/01/2023   Snoring 12/01/2023   Pure hypercholesterolemia 02/16/2022   Prediabetes 02/16/2022   Morbid obesity (HCC) 02/12/2022   Thyroid  nodule 02/12/2022   Asthma 02/12/2022   Viral respiratory infection 02/04/2022   Generalized body aches 02/04/2022   Flu-like symptoms 02/04/2022   Upper respiratory tract infection 11/06/2021   Other headache syndrome 09/10/2021   Fever and chills 09/10/2021   Menorrhagia with irregular cycle 07/09/2021   Abnormal menstruation 07/09/2021   Preventative health care    Sore throat 12/16/2020   Acute cough 12/16/2020   Body aches 12/16/2020   Obesity (BMI 35.0-39.9 without comorbidity) 11/04/2020   Paresthesia 11/04/2020   Anemia, iron  deficiency 07/19/2015   Hypothyroidism 07/21/2013   Anxiety 07/15/2011   Past Medical History:  Diagnosis Date  Acute cough 12/16/2020   Anemia    Asthma    Body aches 12/16/2020   Depression    Mild intermittent asthma with acute exacerbation 07/19/2015   Thyroid  disease    Past Surgical History:  Procedure Laterality Date   CESAREAN SECTION     COLONOSCOPY WITH PROPOFOL  N/A 03/20/2021   Procedure: COLONOSCOPY WITH PROPOFOL ;  Surgeon: Unk Corinn Skiff, MD;  Location: [facility] ENDOSCOPY;  Service: Gastroenterology;  Laterality: N/A;   TUBAL LIGATION  2005   Social History[1] Family History  Problem Relation Age of Onset   Cancer Mother 93        Breast   Arthritis Father    Heart disease Father    Stroke Father    Hypertension Father    Diabetes Father    Congestive Heart Failure Father    Heart attack Father    Cancer Brother    Cancer Maternal Grandmother        Breast   Hypertension Maternal Grandmother    Hypertension Paternal Grandmother    Allergies[2] Medications Ordered Prior to Encounter[3]  Review of Systems  Constitutional:  Positive for fatigue. Negative for appetite change and fever.  HENT:  Positive for sore throat. Negative for congestion, ear pain, rhinorrhea, sinus pressure, sneezing and trouble swallowing.   Eyes:  Negative for pain and discharge.  Respiratory:  Positive for cough and wheezing. Negative for shortness of breath and stridor.   Cardiovascular:  Negative for chest pain.  Gastrointestinal:  Negative for diarrhea, nausea and vomiting.  Genitourinary:  Negative for frequency, hematuria and urgency.  Musculoskeletal:  Negative for arthralgias and myalgias.  Skin:  Negative for rash.  Neurological:  Negative for dizziness, weakness, light-headedness and headaches.  Psychiatric/Behavioral:  Negative for confusion and dysphoric mood.        Objective:   Physical Exam Constitutional:      General: She is not in acute distress.    Appearance: Normal appearance. She is well-developed. She is obese. She is not ill-appearing or diaphoretic.  HENT:     Head: Normocephalic and atraumatic.     Ears:     Comments: Scant cerumen bilateral     Nose:     Comments: Boggy nares     Mouth/Throat:     Mouth: Mucous membranes are moist.     Pharynx: Oropharynx is clear. No oropharyngeal exudate or posterior oropharyngeal erythema.  Eyes:     Conjunctiva/sclera: Conjunctivae normal.     Pupils: Pupils are equal, round, and reactive to light.  Neck:     Thyroid : No thyromegaly.     Vascular: No carotid bruit or JVD.  Cardiovascular:     Rate and Rhythm: Normal rate and regular rhythm.     Heart  sounds: Normal heart sounds.     No gallop.  Pulmonary:     Effort: Pulmonary effort is normal. No respiratory distress.     Breath sounds: Wheezing present. No rales.     Comments: Upper airway sounds Diffuse exp wheezing  Some scattered rhonchi No prolonged exp phase  Abdominal:     General: There is no distension or abdominal bruit.     Palpations: Abdomen is soft.  Musculoskeletal:     Cervical back: Normal range of motion and neck supple.     Right lower leg: No edema.     Left lower leg: No edema.  Lymphadenopathy:     Cervical: No cervical adenopathy.  Skin:    General: Skin is warm and  dry.     Coloration: Skin is not pale.     Findings: No rash.  Neurological:     Mental Status: She is alert.     Coordination: Coordination normal.     Deep Tendon Reflexes: Reflexes are normal and symmetric. Reflexes normal.  Psychiatric:        Mood and Affect: Mood normal.           Assessment & Plan:   Problem List Items Addressed This Visit       Respiratory   Asthma   Acute exacerbation with this uri/bronchitis   Prednisone  bumped to 60 mg taper  Albuterol        Relevant Medications   predniSONE  (DELTASONE ) 20 MG tablet   albuterol  (PROVENTIL ) (2.5 MG/3ML) 0.083% nebulizer solution   Other Relevant Orders   DG Chest 2 View (Completed)   Acute viral bronchitis - Primary   Over a week UC visit 1/8 and virtual visit 1/10 reviewed   Wheezing is not improving , with this some shortness of breath and chest wall soreness from cough Also elevated temp (lower than it was) Of note-flu testing was neg 1/8  On exam-significant wheeze noted (good pulse ox), and upper airway sounds Cxr ordered-no infiltrate noted   In light of length of illness will cover with antibiotic, doxycycline   Prednisone  60 mg taper (now on 20)  Consider nebulizer machine if possible  Continue prometh dm and tessalon  and albuterol    Update if not starting to improve in a week or if worsening   Call back and Er precautions noted in detail today        Relevant Medications   predniSONE  (DELTASONE ) 20 MG tablet   albuterol  (PROVENTIL ) (2.5 MG/3ML) 0.083% nebulizer solution   Other Relevant Orders   DG Chest 2 View (Completed)      [1]  Social History Tobacco Use   Smoking status: Former    Types: Cigars   Smokeless tobacco: Never  Vaping Use   Vaping status: Never Used  Substance Use Topics   Alcohol use: Yes    Alcohol/week: 3.0 standard drinks of alcohol    Types: 3 Glasses of wine per week    Comment: occasional   Drug use: No  [2]  Allergies Allergen Reactions   Augmentin [Amoxicillin-Pot Clavulanate] Other (See Comments)    Muscle cramps  [3]  Current Outpatient Medications on File Prior to Visit  Medication Sig Dispense Refill   albuterol  (VENTOLIN  HFA) 108 (90 Base) MCG/ACT inhaler Inhale 2 puffs into the lungs every 6 (six) hours as needed for wheezing or shortness of breath. 8 g 0   benzonatate  (TESSALON ) 200 MG capsule Take 200 mg by mouth 2 (two) times daily as needed.     Ferrous Sulfate (IRON  PO) Take by mouth.     hydrOXYzine  (ATARAX ) 10 MG tablet Take 1 tablet (10 mg total) by mouth at bedtime as needed. 30 tablet 2   levothyroxine  (SYNTHROID ) 125 MCG tablet TAKE 1 TABLET BY MOUTH DAILY BEFORE BREAKFAST. 90 tablet 0   promethazine-dextromethorphan (PROMETHAZINE-DM) 6.25-15 MG/5ML syrup Take 5 mLs by mouth every 8 (eight) hours as needed.     Relugolix-Estradiol-Norethind (MYFEMBREE ) 40-1-0.5 MG TABS Take 1 tablet by mouth daily. 28 tablet 11   No current facility-administered medications on file prior to visit.   "

## 2024-02-21 NOTE — Assessment & Plan Note (Addendum)
 Over a week UC visit 1/8 and virtual visit 1/10 reviewed   Wheezing is not improving , with this some shortness of breath and chest wall soreness from cough Also elevated temp (lower than it was) Of note-flu testing was neg 1/8  On exam-significant wheeze noted (good pulse ox), and upper airway sounds Cxr ordered-no infiltrate noted   In light of length of illness will cover with antibiotic, doxycycline   Prednisone  60 mg taper (now on 20)  Consider nebulizer machine if possible  Continue prometh dm and tessalon  and albuterol    Update if not starting to improve in a week or if worsening  Call back and Er precautions noted in detail today

## 2024-02-21 NOTE — Telephone Encounter (Signed)
 Noted

## 2024-02-21 NOTE — Telephone Encounter (Signed)
 Seen in office.

## 2024-02-22 ENCOUNTER — Telehealth: Payer: Self-pay

## 2024-02-22 NOTE — Telephone Encounter (Signed)
 [employer]  PA was approved.  Patient notified.

## 2024-02-22 NOTE — Telephone Encounter (Signed)
 Prior authorization request received from covermymeds for [employer] .  PA initiated.  Patient notified. KEY: [id number] DX: D21.9 DX: N92.0

## 2024-03-02 ENCOUNTER — Encounter: Payer: Self-pay | Admitting: Family Medicine

## 2024-03-02 ENCOUNTER — Ambulatory Visit: Admitting: Family Medicine

## 2024-03-02 ENCOUNTER — Ambulatory Visit: Payer: Self-pay | Admitting: *Deleted

## 2024-03-02 VITALS — BP 126/78 | HR 88 | Temp 98.2°F | Ht 59.5 in | Wt 215.2 lb

## 2024-03-02 DIAGNOSIS — R053 Chronic cough: Secondary | ICD-10-CM | POA: Diagnosis not present

## 2024-03-02 DIAGNOSIS — R5383 Other fatigue: Secondary | ICD-10-CM

## 2024-03-02 LAB — POCT INFLUENZA A/B
Influenza A, POC: NEGATIVE
Influenza B, POC: NEGATIVE

## 2024-03-02 LAB — POC COVID19 BINAXNOW: SARS Coronavirus 2 Ag: NEGATIVE

## 2024-03-02 NOTE — Telephone Encounter (Signed)
 Unsure if patient will go to [facility] or ED.  Requesting appt . Recommended ED. Patient is currently fasting and has been since 03/01/24. Please advise.     FYI Only or Action Required?: FYI only for provider: ED advised.  Patient was last seen in primary care on 02/21/2024 by Randeen Laine LABOR, MD.  Called Nurse Triage reporting Fatigue.  Symptoms began several days ago.  Interventions attempted: Other: drinking water and still taking prednisone  .  Symptoms are: gradually worsening.  Triage Disposition: Go to ED Now (or PCP Triage)  Patient/caregiver understands and will follow disposition?: Unsure                Reason for Disposition  Patient sounds very sick or weak to the triager  Answer Assessment - Initial Assessment Questions Recommended ED due to worsening sx. Patient requesting lab work and appt . Please advise. Unsure if patient will go to [facility] or ED.       1. DESCRIPTION: Describe how you are feeling.     Extreme fatigue and chest discomfort at times like sounds like rattling wheezing at times. Taking prednisone  and using inhaler 2. SEVERITY: How bad is it?  Can you stand and walk?     Worsening sx. Can walk but did not go upstairs last night to sleep due to fatigue 3. ONSET: When did these symptoms begin? (e.g., hours, days, weeks, months)     3 days  4. CAUSE: What do you think is causing the weakness or fatigue? (e.g., not drinking enough fluids, medical problem, trouble sleeping)     Reports drinking fluids but is fasting and started 03/01/24 5. NEW MEDICINES:  Have you started on any new medicines recently? (e.g., opioid pain medicines, benzodiazepines, muscle relaxants, antidepressants, antihistamines, neuroleptics, beta blockers)     Prednisone   6. OTHER SYMPTOMS: Do you have any other symptoms? (e.g., chest pain, fever, cough, SOB, vomiting, diarrhea, bleeding, other areas of pain)     Chest discomfort at times, rattling in chest. No  chest pain or pressure. No difficulty breathing reports wheezing at times. Taking prednisone . Severe fatigue, falling asleep in car but not while driving, slept down stairs due to fatigue climbing stairs. No bleeding no fever . 7. PREGNANCY: Is there any chance you are pregnant? When was your last menstrual period?     na  Protocols used: Weakness (Generalized) and Fatigue-A-AH

## 2024-03-02 NOTE — Assessment & Plan Note (Signed)
 Acute, gradually improving cough and minimal symptoms of asthma exacerbation.  Has not had to use albuterol  nebulizer in 24 hours. Most likely resolving viral or bacterial infection status post doxycycline . Main current symptom is extreme fatigue.

## 2024-03-02 NOTE — Telephone Encounter (Signed)
 noted

## 2024-03-02 NOTE — Telephone Encounter (Signed)
Agree with patient being evaluated.

## 2024-03-02 NOTE — Telephone Encounter (Signed)
 I spoke with pt and she wants to be seen at [facility]; pt has no CP or SOB; starting on 02/28/24 pt has had dry cough with wheezing. Pt feels tired but pt is drinking. Pt has not tested for flu or covid. No dry mouth or dizziness.t has not temp. Pt is not sure if will go to UC or ED but pt wants to be seen at [facility]. Dr Avelina has 03/02/24 at 2:20. Pt said she can be here in 15 mins. UC & ED precautions given and pt voiced understanding. Sending note to Dr Avelina Avelina pool and CHRISTELLA Crandall NP as PCP.

## 2024-03-02 NOTE — Telephone Encounter (Signed)
 Can we let the patient know that I have reviewed her sleep study and it does show moderate sleep apnea. I have signed orders for a CPAP. She needs a 90 day follow up with me in office after she starts the CPAP

## 2024-03-02 NOTE — Progress Notes (Signed)
 "   Patient ID: Carla Wheeler, female    DOB: [birth year], 49 y.o.   MRN: [id number]  This visit was conducted in person.  BP 126/78 (Cuff Size: Large)   Pulse 88   Temp 98.2 F (36.8 C) (Oral)   Ht 4' 11.5 (1.511 m)   Wt 215 lb 3.2 oz (97.6 kg)   SpO2 97%   BMI 42.74 kg/m    CC:  Chief Complaint  Patient presents with   Acute Visit    Pt has dry cough, wheezing and feels  a ball  in her throat, chills, body aches, lethargy, weak, onset 1/19    Subjective:   HPI: Carla Wheeler is a 49 y.o. female presenting on 03/02/2024 for Acute Visit (Pt has dry cough, wheezing and feels  a ball  in her throat, chills, body aches, lethargy, weak, onset 1/19)  Saw Urgent Care on 1/8.[name] treated for viral infeciton.. neg COVID, flu.  Continued to feel bad.. seen 1/12 dr. Randeen  Treated with doxycycline , prednisone  taper.  Started feeling better.[name] less cough.   Now significantly worse since 1/19..  On day 3 of illness  Less wheeze, less SOB.  Some pressure in lower throat.  No fever, some chills  Worse body aches.   Sick contacts:  COVID testing:   none     She has tried to treat with  cough medicine ( promethazine/ dexamethasone) taing during the day.  Albuterol  nebulizer off and on.  Has history of mild intermittent asthma.  Former smoker.  CXR: unremarkable.. linear opacity likely  atelectasis and scarring.     Relevant past medical, surgical, family and social history reviewed and updated as indicated. Interim medical history since our last visit reviewed. Allergies and medications reviewed and updated. Outpatient Medications Prior to Visit  Medication Sig Dispense Refill   albuterol  (PROVENTIL ) (2.5 MG/3ML) 0.083% nebulizer solution Take 3 mLs (2.5 mg total) by nebulization every 6 (six) hours as needed for wheezing or shortness of breath. 150 mL 0   albuterol  (VENTOLIN  HFA) 108 (90 Base) MCG/ACT inhaler Inhale 2 puffs into the lungs every 6 (six) hours as needed  for wheezing or shortness of breath. 8 g 0   Ferrous Sulfate (IRON  PO) Take by mouth.     hydrOXYzine  (ATARAX ) 10 MG tablet Take 1 tablet (10 mg total) by mouth at bedtime as needed. 30 tablet 2   levothyroxine  (SYNTHROID ) 125 MCG tablet TAKE 1 TABLET BY MOUTH DAILY BEFORE BREAKFAST. 90 tablet 0   promethazine-dextromethorphan (PROMETHAZINE-DM) 6.25-15 MG/5ML syrup Take 5 mLs by mouth every 8 (eight) hours as needed.     Relugolix-Estradiol-Norethind (MYFEMBREE ) 40-1-0.5 MG TABS Take 1 tablet by mouth daily. 28 tablet 11   doxycycline  (VIBRA -TABS) 100 MG tablet Take 1 tablet (100 mg total) by mouth 2 (two) times daily. (Patient not taking: Reported on 03/02/2024) 14 tablet 0   predniSONE  (DELTASONE ) 20 MG tablet Take 3 pills once daily by mouth for 3 days, then 2 pills once daily for 3 days, then 1 pill once daily for 3 days and then stop (Patient not taking: Reported on 03/02/2024) 30 tablet 0   No facility-administered medications prior to visit.     Per HPI unless specifically indicated in ROS section below Review of Systems  Constitutional:  Positive for fatigue. Negative for fever.  HENT:  Negative for congestion, postnasal drip, rhinorrhea, sinus pressure, sinus pain, sneezing and sore throat.   Eyes:  Negative for pain.  Respiratory:  Positive  for cough. Negative for shortness of breath and wheezing.   Cardiovascular:  Negative for chest pain, palpitations and leg swelling.  Gastrointestinal:  Negative for abdominal pain.  Genitourinary:  Negative for dysuria and vaginal bleeding.  Musculoskeletal:  Negative for back pain.  Neurological:  Negative for syncope, light-headedness and headaches.  Psychiatric/Behavioral:  Negative for dysphoric mood.    Objective:  BP 126/78 (Cuff Size: Large)   Pulse 88   Temp 98.2 F (36.8 C) (Oral)   Ht 4' 11.5 (1.511 m)   Wt 215 lb 3.2 oz (97.6 kg)   SpO2 97%   BMI 42.74 kg/m   Wt Readings from Last 3 Encounters:  03/02/24 215 lb 3.2 oz (97.6  kg)  02/21/24 216 lb 2 oz (98 kg)  01/20/24 219 lb 6.4 oz (99.5 kg)      Physical Exam Constitutional:      General: She is not in acute distress.    Appearance: Normal appearance. She is well-developed. She is not ill-appearing or toxic-appearing.  HENT:     Head: Normocephalic.     Right Ear: Hearing, tympanic membrane, ear canal and external ear normal. Tympanic membrane is not erythematous, retracted or bulging.     Left Ear: Hearing, tympanic membrane, ear canal and external ear normal. Tympanic membrane is not erythematous, retracted or bulging.     Nose: No mucosal edema or rhinorrhea.     Right Sinus: No maxillary sinus tenderness or frontal sinus tenderness.     Left Sinus: No maxillary sinus tenderness or frontal sinus tenderness.     Mouth/Throat:     Pharynx: Uvula midline.  Eyes:     General: Lids are normal. Lids are everted, no foreign bodies appreciated.     Conjunctiva/sclera: Conjunctivae normal.     Pupils: Pupils are equal, round, and reactive to light.  Neck:     Thyroid : No thyroid  mass or thyromegaly.     Vascular: No carotid bruit.     Trachea: Trachea normal.  Cardiovascular:     Rate and Rhythm: Normal rate and regular rhythm.     Pulses: Normal pulses.     Heart sounds: Normal heart sounds, S1 normal and S2 normal. No murmur heard.    No friction rub. No gallop.  Pulmonary:     Effort: Pulmonary effort is normal. No tachypnea or respiratory distress.     Breath sounds: Normal breath sounds. No decreased breath sounds, wheezing, rhonchi or rales.  Abdominal:     General: Bowel sounds are normal.     Palpations: Abdomen is soft.     Tenderness: There is no abdominal tenderness.  Musculoskeletal:     Cervical back: Normal range of motion and neck supple.  Skin:    General: Skin is warm and dry.     Findings: No rash.  Neurological:     Mental Status: She is alert.  Psychiatric:        Mood and Affect: Mood is not anxious or depressed.         Speech: Speech normal.        Behavior: Behavior normal. Behavior is cooperative.        Thought Content: Thought content normal.        Judgment: Judgment normal.       Results for orders placed or performed in visit on 03/02/24  POCT Influenza A/B   Collection Time: 03/02/24  2:54 PM  Result Value Ref Range   Influenza A, POC Negative Negative  Influenza B, POC Negative Negative  POC COVID-19 BinaxNow   Collection Time: 03/02/24  2:55 PM  Result Value Ref Range   SARS Coronavirus 2 Ag Negative Negative    Assessment and Plan  Other fatigue Assessment & Plan: Acute, extreme fatigue. May be secondary to resolving infection but given severity of symptoms will evaluate for anemia, electrolyte issue worsening thyroid  function new sugar issue on recent prednisone . Will also check Monospot to rule out mononucleosis. Patient has been taking promethazine dextromethorphan cough syrup during the day which could be causing oversedation.  I have encouraged her to stop this medication or at least just use it at night if needed.  No evidence of dehydration with normal blood pressure and pulse.  She will continue to push fluids.  Return and ER precautions provided.  Orders: -     POCT Influenza A/B -     POC COVID-19 BinaxNow -     CBC with Differential/Platelet -     TSH -     Comprehensive metabolic panel with GFR -     Mononucleosis screen  Persistent cough Assessment & Plan: Acute, gradually improving cough and minimal symptoms of asthma exacerbation.  Has not had to use albuterol  nebulizer in 24 hours. Most likely resolving viral or bacterial infection status post doxycycline . Main current symptom is extreme fatigue.  Orders: -     POC COVID-19 BinaxNow    Return if symptoms worsen or fail to improve.   Greig Ring, MD  "

## 2024-03-02 NOTE — Assessment & Plan Note (Addendum)
 Acute, extreme fatigue. May be secondary to resolving infection but given severity of symptoms will evaluate for anemia, electrolyte issue worsening thyroid  function new sugar issue on recent prednisone . Will also check Monospot to rule out mononucleosis. Patient has been taking promethazine dextromethorphan cough syrup during the day which could be causing oversedation.  I have encouraged her to stop this medication or at least just use it at night if needed. Negative COVID and flu test today.  No evidence of dehydration with normal blood pressure and pulse.  She will continue to push fluids.  Return and ER precautions provided.

## 2024-03-03 ENCOUNTER — Ambulatory Visit: Payer: Self-pay | Admitting: Family Medicine

## 2024-03-03 ENCOUNTER — Encounter: Payer: Self-pay | Admitting: Nurse Practitioner

## 2024-03-03 LAB — CBC WITH DIFFERENTIAL/PLATELET
Basophils Absolute: 0.1 K/uL (ref 0.0–0.1)
Basophils Relative: 1.3 % (ref 0.0–3.0)
Eosinophils Absolute: 0.2 K/uL (ref 0.0–0.7)
Eosinophils Relative: 2.5 % (ref 0.0–5.0)
HCT: 46 % (ref 36.0–46.0)
Hemoglobin: 15.5 g/dL — ABNORMAL HIGH (ref 12.0–15.0)
Lymphocytes Relative: 39.8 % (ref 12.0–46.0)
Lymphs Abs: 2.6 K/uL (ref 0.7–4.0)
MCHC: 33.8 g/dL (ref 30.0–36.0)
MCV: 86 fl (ref 78.0–100.0)
Monocytes Absolute: 0.5 K/uL (ref 0.1–1.0)
Monocytes Relative: 7.3 % (ref 3.0–12.0)
Neutro Abs: 3.2 K/uL (ref 1.4–7.7)
Neutrophils Relative %: 49.1 % (ref 43.0–77.0)
Platelets: 310 K/uL (ref 150.0–400.0)
RBC: 5.34 Mil/uL — ABNORMAL HIGH (ref 3.87–5.11)
RDW: 14.9 % (ref 11.5–15.5)
WBC: 6.5 K/uL (ref 4.0–10.5)

## 2024-03-03 LAB — COMPREHENSIVE METABOLIC PANEL WITH GFR
ALT: 16 U/L (ref 3–35)
AST: 13 U/L (ref 5–37)
Albumin: 4.4 g/dL (ref 3.5–5.2)
Alkaline Phosphatase: 59 U/L (ref 39–117)
BUN: 15 mg/dL (ref 6–23)
CO2: 31 meq/L (ref 19–32)
Calcium: 9.4 mg/dL (ref 8.4–10.5)
Chloride: 103 meq/L (ref 96–112)
Creatinine, Ser: 0.8 mg/dL (ref 0.40–1.20)
GFR: 86.07 mL/min
Glucose, Bld: 95 mg/dL (ref 70–99)
Potassium: 4.8 meq/L (ref 3.5–5.1)
Sodium: 140 meq/L (ref 135–145)
Total Bilirubin: 0.4 mg/dL (ref 0.2–1.2)
Total Protein: 7.3 g/dL (ref 6.0–8.3)

## 2024-03-03 LAB — MONONUCLEOSIS SCREEN: Mono Screen: NEGATIVE

## 2024-03-03 LAB — TSH: TSH: 2.44 u[IU]/mL (ref 0.35–5.50)

## 2024-03-17 ENCOUNTER — Telehealth: Payer: Self-pay

## 2024-03-17 NOTE — Telephone Encounter (Signed)
 Copied from CRM #[id number]. Topic: Clinical - Lab/Test Results >> Mar 17, 2024  3:38 PM Taleah C wrote: Reason for CRM: pt stated that she was informed by [facility] that she needs her results from when she did the at-home sleep study for her records.   She would also like a nurse to go over what her results mean. Please call and advise.
